# Patient Record
Sex: Female | Born: 1935 | Race: White | Hispanic: No | Smoking: Never smoker
Health system: Southern US, Community
[De-identification: ages and names within clinical notes are randomized; demographics above are authoritative.]

## PROBLEM LIST (undated history)

## (undated) DIAGNOSIS — I252 Old myocardial infarction: Secondary | ICD-10-CM

## (undated) DIAGNOSIS — D649 Anemia, unspecified: Secondary | ICD-10-CM

## (undated) DIAGNOSIS — Z8719 Personal history of other diseases of the digestive system: Secondary | ICD-10-CM

## (undated) DIAGNOSIS — E079 Disorder of thyroid, unspecified: Secondary | ICD-10-CM

## (undated) DIAGNOSIS — I639 Cerebral infarction, unspecified: Secondary | ICD-10-CM

## (undated) DIAGNOSIS — J449 Chronic obstructive pulmonary disease, unspecified: Secondary | ICD-10-CM

## (undated) DIAGNOSIS — D481 Neoplasm of uncertain behavior of connective and other soft tissue: Secondary | ICD-10-CM

## (undated) DIAGNOSIS — D4819 Other specified neoplasm of uncertain behavior of connective and other soft tissue: Secondary | ICD-10-CM

## (undated) DIAGNOSIS — E785 Hyperlipidemia, unspecified: Secondary | ICD-10-CM

## (undated) DIAGNOSIS — F419 Anxiety disorder, unspecified: Secondary | ICD-10-CM

## (undated) DIAGNOSIS — J9610 Chronic respiratory failure, unspecified whether with hypoxia or hypercapnia: Secondary | ICD-10-CM

## (undated) DIAGNOSIS — I10 Essential (primary) hypertension: Secondary | ICD-10-CM

## (undated) HISTORY — DX: Chronic respiratory failure, unspecified whether with hypoxia or hypercapnia: J96.10

## (undated) HISTORY — DX: Chronic obstructive pulmonary disease, unspecified: J44.9

## (undated) HISTORY — DX: Other specified neoplasm of uncertain behavior of connective and other soft tissue: D48.19

## (undated) HISTORY — PX: APPENDECTOMY: SHX54

## (undated) HISTORY — DX: Disorder of thyroid, unspecified: E07.9

## (undated) HISTORY — DX: Essential (primary) hypertension: I10

## (undated) HISTORY — PX: CORONARY ARTERY BYPASS GRAFT: SHX141

## (undated) HISTORY — PX: HERNIA REPAIR: SHX51

## (undated) HISTORY — PX: INSERT / REPLACE / REMOVE PACEMAKER: SUR710

## (undated) HISTORY — DX: Old myocardial infarction: I25.2

## (undated) HISTORY — PX: AORTIC VALVE REPAIR: SHX6306

## (undated) HISTORY — DX: Cerebral infarction, unspecified: I63.9

## (undated) HISTORY — DX: Personal history of other diseases of the digestive system: Z87.19

## (undated) HISTORY — DX: Anemia, unspecified: D64.9

## (undated) HISTORY — DX: Neoplasm of uncertain behavior of connective and other soft tissue: D48.1

## (undated) HISTORY — PX: ABDOMINAL HYSTERECTOMY: SHX81

## (undated) HISTORY — DX: Anxiety disorder, unspecified: F41.9

## (undated) HISTORY — DX: Hyperlipidemia, unspecified: E78.5

---

## 2008-03-01 ENCOUNTER — Ambulatory Visit: Payer: Self-pay | Admitting: Thoracic Surgery (Cardiothoracic Vascular Surgery)

## 2014-01-26 HISTORY — PX: ESOPHAGOGASTRODUODENOSCOPY: SHX1529

## 2014-03-09 DIAGNOSIS — I4891 Unspecified atrial fibrillation: Secondary | ICD-10-CM

## 2014-03-09 DIAGNOSIS — I639 Cerebral infarction, unspecified: Secondary | ICD-10-CM

## 2014-03-09 DIAGNOSIS — I6529 Occlusion and stenosis of unspecified carotid artery: Secondary | ICD-10-CM

## 2014-03-09 HISTORY — DX: Occlusion and stenosis of unspecified carotid artery: I65.29

## 2014-03-09 HISTORY — DX: Unspecified atrial fibrillation: I48.91

## 2014-03-09 HISTORY — DX: Cerebral infarction, unspecified: I63.9

## 2014-11-01 DIAGNOSIS — Z950 Presence of cardiac pacemaker: Secondary | ICD-10-CM | POA: Diagnosis not present

## 2014-11-01 DIAGNOSIS — I2510 Atherosclerotic heart disease of native coronary artery without angina pectoris: Secondary | ICD-10-CM | POA: Diagnosis not present

## 2014-11-01 DIAGNOSIS — I4891 Unspecified atrial fibrillation: Secondary | ICD-10-CM | POA: Diagnosis not present

## 2014-11-08 DIAGNOSIS — E785 Hyperlipidemia, unspecified: Secondary | ICD-10-CM | POA: Diagnosis not present

## 2014-11-08 DIAGNOSIS — G250 Essential tremor: Secondary | ICD-10-CM | POA: Diagnosis not present

## 2014-11-08 DIAGNOSIS — J449 Chronic obstructive pulmonary disease, unspecified: Secondary | ICD-10-CM | POA: Diagnosis not present

## 2014-11-08 DIAGNOSIS — Z681 Body mass index (BMI) 19 or less, adult: Secondary | ICD-10-CM | POA: Diagnosis not present

## 2014-11-08 DIAGNOSIS — G2581 Restless legs syndrome: Secondary | ICD-10-CM | POA: Diagnosis not present

## 2014-11-08 DIAGNOSIS — Z79899 Other long term (current) drug therapy: Secondary | ICD-10-CM | POA: Diagnosis not present

## 2014-11-08 DIAGNOSIS — E559 Vitamin D deficiency, unspecified: Secondary | ICD-10-CM | POA: Diagnosis not present

## 2014-11-08 DIAGNOSIS — M545 Low back pain: Secondary | ICD-10-CM | POA: Diagnosis not present

## 2014-11-14 DIAGNOSIS — I499 Cardiac arrhythmia, unspecified: Secondary | ICD-10-CM | POA: Diagnosis not present

## 2014-11-15 DIAGNOSIS — L578 Other skin changes due to chronic exposure to nonionizing radiation: Secondary | ICD-10-CM | POA: Diagnosis not present

## 2014-11-15 DIAGNOSIS — L821 Other seborrheic keratosis: Secondary | ICD-10-CM | POA: Diagnosis not present

## 2014-11-15 DIAGNOSIS — L820 Inflamed seborrheic keratosis: Secondary | ICD-10-CM | POA: Diagnosis not present

## 2015-01-10 DIAGNOSIS — M19011 Primary osteoarthritis, right shoulder: Secondary | ICD-10-CM | POA: Diagnosis not present

## 2015-01-10 DIAGNOSIS — Z8781 Personal history of (healed) traumatic fracture: Secondary | ICD-10-CM | POA: Diagnosis not present

## 2015-01-10 DIAGNOSIS — G2581 Restless legs syndrome: Secondary | ICD-10-CM | POA: Diagnosis not present

## 2015-01-10 DIAGNOSIS — M25512 Pain in left shoulder: Secondary | ICD-10-CM | POA: Diagnosis not present

## 2015-01-10 DIAGNOSIS — M545 Low back pain: Secondary | ICD-10-CM | POA: Diagnosis not present

## 2015-01-10 DIAGNOSIS — M129 Arthropathy, unspecified: Secondary | ICD-10-CM | POA: Diagnosis not present

## 2015-01-10 DIAGNOSIS — Z681 Body mass index (BMI) 19 or less, adult: Secondary | ICD-10-CM | POA: Diagnosis not present

## 2015-01-24 DIAGNOSIS — J449 Chronic obstructive pulmonary disease, unspecified: Secondary | ICD-10-CM | POA: Diagnosis not present

## 2015-01-24 DIAGNOSIS — Z13820 Encounter for screening for osteoporosis: Secondary | ICD-10-CM | POA: Diagnosis not present

## 2015-01-24 DIAGNOSIS — Z79899 Other long term (current) drug therapy: Secondary | ICD-10-CM | POA: Diagnosis not present

## 2015-02-08 DIAGNOSIS — R05 Cough: Secondary | ICD-10-CM | POA: Diagnosis not present

## 2015-02-08 DIAGNOSIS — Z6821 Body mass index (BMI) 21.0-21.9, adult: Secondary | ICD-10-CM | POA: Diagnosis not present

## 2015-02-08 DIAGNOSIS — J069 Acute upper respiratory infection, unspecified: Secondary | ICD-10-CM | POA: Diagnosis not present

## 2015-02-28 DIAGNOSIS — I6522 Occlusion and stenosis of left carotid artery: Secondary | ICD-10-CM | POA: Diagnosis not present

## 2015-03-01 DIAGNOSIS — I6522 Occlusion and stenosis of left carotid artery: Secondary | ICD-10-CM | POA: Diagnosis not present

## 2015-03-01 DIAGNOSIS — I63319 Cerebral infarction due to thrombosis of unspecified middle cerebral artery: Secondary | ICD-10-CM | POA: Diagnosis not present

## 2015-03-01 DIAGNOSIS — G250 Essential tremor: Secondary | ICD-10-CM | POA: Insufficient documentation

## 2015-03-01 DIAGNOSIS — I4891 Unspecified atrial fibrillation: Secondary | ICD-10-CM | POA: Diagnosis not present

## 2015-03-01 HISTORY — DX: Essential tremor: G25.0

## 2015-03-13 DIAGNOSIS — Z1212 Encounter for screening for malignant neoplasm of rectum: Secondary | ICD-10-CM | POA: Diagnosis not present

## 2015-03-13 DIAGNOSIS — Z1231 Encounter for screening mammogram for malignant neoplasm of breast: Secondary | ICD-10-CM | POA: Diagnosis not present

## 2015-03-13 DIAGNOSIS — F39 Unspecified mood [affective] disorder: Secondary | ICD-10-CM | POA: Diagnosis not present

## 2015-03-13 DIAGNOSIS — M545 Low back pain: Secondary | ICD-10-CM | POA: Diagnosis not present

## 2015-03-13 DIAGNOSIS — E785 Hyperlipidemia, unspecified: Secondary | ICD-10-CM | POA: Diagnosis not present

## 2015-03-13 DIAGNOSIS — E559 Vitamin D deficiency, unspecified: Secondary | ICD-10-CM | POA: Diagnosis not present

## 2015-03-13 DIAGNOSIS — I10 Essential (primary) hypertension: Secondary | ICD-10-CM | POA: Diagnosis not present

## 2015-03-13 DIAGNOSIS — Z79899 Other long term (current) drug therapy: Secondary | ICD-10-CM | POA: Diagnosis not present

## 2015-03-13 DIAGNOSIS — Z0000 Encounter for general adult medical examination without abnormal findings: Secondary | ICD-10-CM | POA: Diagnosis not present

## 2015-04-02 DIAGNOSIS — R300 Dysuria: Secondary | ICD-10-CM | POA: Diagnosis not present

## 2015-04-02 DIAGNOSIS — Z6822 Body mass index (BMI) 22.0-22.9, adult: Secondary | ICD-10-CM | POA: Diagnosis not present

## 2015-04-02 DIAGNOSIS — N390 Urinary tract infection, site not specified: Secondary | ICD-10-CM | POA: Diagnosis not present

## 2015-04-11 DIAGNOSIS — Z1231 Encounter for screening mammogram for malignant neoplasm of breast: Secondary | ICD-10-CM | POA: Diagnosis not present

## 2015-04-23 DIAGNOSIS — Z950 Presence of cardiac pacemaker: Secondary | ICD-10-CM

## 2015-04-23 DIAGNOSIS — I6522 Occlusion and stenosis of left carotid artery: Secondary | ICD-10-CM | POA: Diagnosis not present

## 2015-04-23 DIAGNOSIS — I10 Essential (primary) hypertension: Secondary | ICD-10-CM | POA: Insufficient documentation

## 2015-04-23 DIAGNOSIS — Z9889 Other specified postprocedural states: Secondary | ICD-10-CM

## 2015-04-23 DIAGNOSIS — I4891 Unspecified atrial fibrillation: Secondary | ICD-10-CM | POA: Diagnosis not present

## 2015-04-23 HISTORY — DX: Other specified postprocedural states: Z98.890

## 2015-04-23 HISTORY — DX: Presence of cardiac pacemaker: Z95.0

## 2015-04-23 HISTORY — DX: Essential (primary) hypertension: I10

## 2015-04-26 DIAGNOSIS — J449 Chronic obstructive pulmonary disease, unspecified: Secondary | ICD-10-CM | POA: Diagnosis not present

## 2015-04-26 DIAGNOSIS — R0602 Shortness of breath: Secondary | ICD-10-CM | POA: Diagnosis not present

## 2015-05-10 DIAGNOSIS — N3000 Acute cystitis without hematuria: Secondary | ICD-10-CM | POA: Diagnosis not present

## 2015-05-10 DIAGNOSIS — N3001 Acute cystitis with hematuria: Secondary | ICD-10-CM | POA: Diagnosis not present

## 2015-05-14 DIAGNOSIS — G2581 Restless legs syndrome: Secondary | ICD-10-CM | POA: Diagnosis not present

## 2015-05-14 DIAGNOSIS — R350 Frequency of micturition: Secondary | ICD-10-CM | POA: Diagnosis not present

## 2015-05-14 DIAGNOSIS — Z681 Body mass index (BMI) 19 or less, adult: Secondary | ICD-10-CM | POA: Diagnosis not present

## 2015-05-14 DIAGNOSIS — M545 Low back pain: Secondary | ICD-10-CM | POA: Diagnosis not present

## 2015-05-14 DIAGNOSIS — I10 Essential (primary) hypertension: Secondary | ICD-10-CM | POA: Diagnosis not present

## 2015-05-15 DIAGNOSIS — Z45018 Encounter for adjustment and management of other part of cardiac pacemaker: Secondary | ICD-10-CM | POA: Diagnosis not present

## 2015-05-15 DIAGNOSIS — I498 Other specified cardiac arrhythmias: Secondary | ICD-10-CM | POA: Diagnosis not present

## 2015-07-16 DIAGNOSIS — Z139 Encounter for screening, unspecified: Secondary | ICD-10-CM | POA: Diagnosis not present

## 2015-07-16 DIAGNOSIS — M129 Arthropathy, unspecified: Secondary | ICD-10-CM | POA: Diagnosis not present

## 2015-07-16 DIAGNOSIS — G2581 Restless legs syndrome: Secondary | ICD-10-CM | POA: Diagnosis not present

## 2015-07-16 DIAGNOSIS — M545 Low back pain: Secondary | ICD-10-CM | POA: Diagnosis not present

## 2015-07-16 DIAGNOSIS — Z1389 Encounter for screening for other disorder: Secondary | ICD-10-CM | POA: Diagnosis not present

## 2015-07-16 DIAGNOSIS — Z6822 Body mass index (BMI) 22.0-22.9, adult: Secondary | ICD-10-CM | POA: Diagnosis not present

## 2015-07-16 DIAGNOSIS — Z9181 History of falling: Secondary | ICD-10-CM | POA: Diagnosis not present

## 2015-08-02 DIAGNOSIS — I10 Essential (primary) hypertension: Secondary | ICD-10-CM | POA: Diagnosis not present

## 2015-08-02 DIAGNOSIS — Z23 Encounter for immunization: Secondary | ICD-10-CM | POA: Diagnosis not present

## 2015-08-02 DIAGNOSIS — J449 Chronic obstructive pulmonary disease, unspecified: Secondary | ICD-10-CM | POA: Diagnosis not present

## 2015-08-02 DIAGNOSIS — R0602 Shortness of breath: Secondary | ICD-10-CM | POA: Diagnosis not present

## 2015-08-02 DIAGNOSIS — J300 Vasomotor rhinitis: Secondary | ICD-10-CM | POA: Diagnosis not present

## 2015-09-04 DIAGNOSIS — Z950 Presence of cardiac pacemaker: Secondary | ICD-10-CM | POA: Diagnosis not present

## 2015-09-04 DIAGNOSIS — Z9282 Status post administration of tPA (rtPA) in a different facility within the last 24 hours prior to admission to current facility: Secondary | ICD-10-CM | POA: Diagnosis not present

## 2015-09-04 DIAGNOSIS — I361 Nonrheumatic tricuspid (valve) insufficiency: Secondary | ICD-10-CM | POA: Diagnosis not present

## 2015-09-04 DIAGNOSIS — G8194 Hemiplegia, unspecified affecting left nondominant side: Secondary | ICD-10-CM | POA: Diagnosis not present

## 2015-09-04 DIAGNOSIS — R079 Chest pain, unspecified: Secondary | ICD-10-CM | POA: Diagnosis not present

## 2015-09-04 DIAGNOSIS — G250 Essential tremor: Secondary | ICD-10-CM | POA: Diagnosis not present

## 2015-09-04 DIAGNOSIS — I10 Essential (primary) hypertension: Secondary | ICD-10-CM | POA: Diagnosis not present

## 2015-09-04 DIAGNOSIS — I639 Cerebral infarction, unspecified: Secondary | ICD-10-CM | POA: Diagnosis not present

## 2015-09-04 DIAGNOSIS — R29810 Facial weakness: Secondary | ICD-10-CM | POA: Diagnosis not present

## 2015-09-04 DIAGNOSIS — I63411 Cerebral infarction due to embolism of right middle cerebral artery: Secondary | ICD-10-CM | POA: Insufficient documentation

## 2015-09-04 DIAGNOSIS — G459 Transient cerebral ischemic attack, unspecified: Secondary | ICD-10-CM | POA: Diagnosis not present

## 2015-09-04 DIAGNOSIS — M6281 Muscle weakness (generalized): Secondary | ICD-10-CM | POA: Diagnosis not present

## 2015-09-04 DIAGNOSIS — K922 Gastrointestinal hemorrhage, unspecified: Secondary | ICD-10-CM | POA: Diagnosis not present

## 2015-09-04 DIAGNOSIS — E785 Hyperlipidemia, unspecified: Secondary | ICD-10-CM | POA: Diagnosis not present

## 2015-09-04 DIAGNOSIS — M25552 Pain in left hip: Secondary | ICD-10-CM | POA: Diagnosis not present

## 2015-09-04 DIAGNOSIS — M25551 Pain in right hip: Secondary | ICD-10-CM | POA: Diagnosis not present

## 2015-09-04 DIAGNOSIS — I4891 Unspecified atrial fibrillation: Secondary | ICD-10-CM | POA: Diagnosis not present

## 2015-09-04 DIAGNOSIS — I071 Rheumatic tricuspid insufficiency: Secondary | ICD-10-CM | POA: Diagnosis not present

## 2015-09-04 DIAGNOSIS — R4781 Slurred speech: Secondary | ICD-10-CM | POA: Diagnosis not present

## 2015-09-04 DIAGNOSIS — Z23 Encounter for immunization: Secondary | ICD-10-CM | POA: Diagnosis not present

## 2015-09-04 DIAGNOSIS — I6789 Other cerebrovascular disease: Secondary | ICD-10-CM | POA: Diagnosis not present

## 2015-09-04 DIAGNOSIS — I480 Paroxysmal atrial fibrillation: Secondary | ICD-10-CM | POA: Diagnosis not present

## 2015-09-04 DIAGNOSIS — G510 Bell's palsy: Secondary | ICD-10-CM | POA: Diagnosis not present

## 2015-09-04 DIAGNOSIS — S7002XA Contusion of left hip, initial encounter: Secondary | ICD-10-CM | POA: Diagnosis not present

## 2015-09-04 DIAGNOSIS — I482 Chronic atrial fibrillation: Secondary | ICD-10-CM | POA: Diagnosis not present

## 2015-09-04 HISTORY — DX: Cerebral infarction due to embolism of right middle cerebral artery: I63.411

## 2015-09-05 DIAGNOSIS — E785 Hyperlipidemia, unspecified: Secondary | ICD-10-CM | POA: Insufficient documentation

## 2015-09-07 DIAGNOSIS — Z23 Encounter for immunization: Secondary | ICD-10-CM | POA: Diagnosis not present

## 2015-09-07 DIAGNOSIS — I63411 Cerebral infarction due to embolism of right middle cerebral artery: Secondary | ICD-10-CM | POA: Diagnosis not present

## 2015-09-07 DIAGNOSIS — G510 Bell's palsy: Secondary | ICD-10-CM | POA: Diagnosis not present

## 2015-09-07 DIAGNOSIS — Z950 Presence of cardiac pacemaker: Secondary | ICD-10-CM | POA: Diagnosis not present

## 2015-09-07 DIAGNOSIS — G8194 Hemiplegia, unspecified affecting left nondominant side: Secondary | ICD-10-CM | POA: Diagnosis not present

## 2015-09-07 DIAGNOSIS — E785 Hyperlipidemia, unspecified: Secondary | ICD-10-CM | POA: Diagnosis not present

## 2015-09-07 DIAGNOSIS — I071 Rheumatic tricuspid insufficiency: Secondary | ICD-10-CM | POA: Diagnosis not present

## 2015-09-07 DIAGNOSIS — I10 Essential (primary) hypertension: Secondary | ICD-10-CM | POA: Diagnosis not present

## 2015-09-07 DIAGNOSIS — I480 Paroxysmal atrial fibrillation: Secondary | ICD-10-CM | POA: Diagnosis not present

## 2015-09-13 DIAGNOSIS — Z79899 Other long term (current) drug therapy: Secondary | ICD-10-CM | POA: Diagnosis not present

## 2015-09-13 DIAGNOSIS — M545 Low back pain: Secondary | ICD-10-CM | POA: Diagnosis not present

## 2015-09-13 DIAGNOSIS — E782 Mixed hyperlipidemia: Secondary | ICD-10-CM | POA: Diagnosis not present

## 2015-09-13 DIAGNOSIS — I4891 Unspecified atrial fibrillation: Secondary | ICD-10-CM | POA: Diagnosis not present

## 2015-09-13 DIAGNOSIS — G2581 Restless legs syndrome: Secondary | ICD-10-CM | POA: Diagnosis not present

## 2015-09-13 DIAGNOSIS — I714 Abdominal aortic aneurysm, without rupture: Secondary | ICD-10-CM | POA: Diagnosis not present

## 2015-09-13 DIAGNOSIS — J449 Chronic obstructive pulmonary disease, unspecified: Secondary | ICD-10-CM | POA: Diagnosis not present

## 2015-09-13 DIAGNOSIS — E039 Hypothyroidism, unspecified: Secondary | ICD-10-CM | POA: Diagnosis not present

## 2015-09-13 DIAGNOSIS — K219 Gastro-esophageal reflux disease without esophagitis: Secondary | ICD-10-CM | POA: Diagnosis not present

## 2015-09-25 DIAGNOSIS — I63411 Cerebral infarction due to embolism of right middle cerebral artery: Secondary | ICD-10-CM | POA: Diagnosis not present

## 2015-09-25 DIAGNOSIS — I63319 Cerebral infarction due to thrombosis of unspecified middle cerebral artery: Secondary | ICD-10-CM | POA: Diagnosis not present

## 2015-09-25 DIAGNOSIS — I480 Paroxysmal atrial fibrillation: Secondary | ICD-10-CM | POA: Diagnosis not present

## 2015-09-25 DIAGNOSIS — Z950 Presence of cardiac pacemaker: Secondary | ICD-10-CM | POA: Diagnosis not present

## 2015-09-25 DIAGNOSIS — E782 Mixed hyperlipidemia: Secondary | ICD-10-CM | POA: Diagnosis not present

## 2015-09-25 DIAGNOSIS — I6529 Occlusion and stenosis of unspecified carotid artery: Secondary | ICD-10-CM | POA: Diagnosis not present

## 2015-09-25 DIAGNOSIS — I10 Essential (primary) hypertension: Secondary | ICD-10-CM | POA: Diagnosis not present

## 2015-10-04 DIAGNOSIS — J449 Chronic obstructive pulmonary disease, unspecified: Secondary | ICD-10-CM | POA: Diagnosis not present

## 2015-10-08 DIAGNOSIS — I6522 Occlusion and stenosis of left carotid artery: Secondary | ICD-10-CM | POA: Diagnosis not present

## 2015-10-08 DIAGNOSIS — I4891 Unspecified atrial fibrillation: Secondary | ICD-10-CM | POA: Diagnosis not present

## 2015-10-08 DIAGNOSIS — I63319 Cerebral infarction due to thrombosis of unspecified middle cerebral artery: Secondary | ICD-10-CM | POA: Diagnosis not present

## 2015-10-08 DIAGNOSIS — G250 Essential tremor: Secondary | ICD-10-CM | POA: Diagnosis not present

## 2015-10-09 DIAGNOSIS — I498 Other specified cardiac arrhythmias: Secondary | ICD-10-CM | POA: Diagnosis not present

## 2015-10-09 DIAGNOSIS — Z45018 Encounter for adjustment and management of other part of cardiac pacemaker: Secondary | ICD-10-CM | POA: Diagnosis not present

## 2015-10-24 DIAGNOSIS — E782 Mixed hyperlipidemia: Secondary | ICD-10-CM | POA: Diagnosis not present

## 2015-10-24 DIAGNOSIS — I63411 Cerebral infarction due to embolism of right middle cerebral artery: Secondary | ICD-10-CM | POA: Diagnosis not present

## 2015-10-24 DIAGNOSIS — I480 Paroxysmal atrial fibrillation: Secondary | ICD-10-CM | POA: Diagnosis not present

## 2015-10-27 DIAGNOSIS — I4891 Unspecified atrial fibrillation: Secondary | ICD-10-CM | POA: Diagnosis not present

## 2015-10-27 DIAGNOSIS — R200 Anesthesia of skin: Secondary | ICD-10-CM | POA: Diagnosis not present

## 2015-10-27 DIAGNOSIS — I6789 Other cerebrovascular disease: Secondary | ICD-10-CM | POA: Diagnosis not present

## 2015-10-27 DIAGNOSIS — I10 Essential (primary) hypertension: Secondary | ICD-10-CM | POA: Diagnosis not present

## 2015-10-27 DIAGNOSIS — Z950 Presence of cardiac pacemaker: Secondary | ICD-10-CM | POA: Diagnosis not present

## 2015-10-27 DIAGNOSIS — Z8673 Personal history of transient ischemic attack (TIA), and cerebral infarction without residual deficits: Secondary | ICD-10-CM | POA: Diagnosis not present

## 2015-10-31 DIAGNOSIS — I10 Essential (primary) hypertension: Secondary | ICD-10-CM | POA: Diagnosis not present

## 2015-10-31 DIAGNOSIS — Z9181 History of falling: Secondary | ICD-10-CM | POA: Diagnosis not present

## 2015-10-31 DIAGNOSIS — I4891 Unspecified atrial fibrillation: Secondary | ICD-10-CM | POA: Diagnosis not present

## 2015-10-31 DIAGNOSIS — I679 Cerebrovascular disease, unspecified: Secondary | ICD-10-CM | POA: Diagnosis not present

## 2015-10-31 DIAGNOSIS — I63319 Cerebral infarction due to thrombosis of unspecified middle cerebral artery: Secondary | ICD-10-CM | POA: Diagnosis not present

## 2015-10-31 DIAGNOSIS — I6522 Occlusion and stenosis of left carotid artery: Secondary | ICD-10-CM | POA: Diagnosis not present

## 2015-11-13 DIAGNOSIS — I679 Cerebrovascular disease, unspecified: Secondary | ICD-10-CM | POA: Diagnosis not present

## 2015-11-13 DIAGNOSIS — I6523 Occlusion and stenosis of bilateral carotid arteries: Secondary | ICD-10-CM | POA: Diagnosis not present

## 2015-11-13 DIAGNOSIS — I6522 Occlusion and stenosis of left carotid artery: Secondary | ICD-10-CM | POA: Diagnosis not present

## 2015-11-14 DIAGNOSIS — M129 Arthropathy, unspecified: Secondary | ICD-10-CM | POA: Diagnosis not present

## 2015-11-14 DIAGNOSIS — I10 Essential (primary) hypertension: Secondary | ICD-10-CM | POA: Diagnosis not present

## 2015-11-14 DIAGNOSIS — E039 Hypothyroidism, unspecified: Secondary | ICD-10-CM | POA: Diagnosis not present

## 2015-11-14 DIAGNOSIS — Z79899 Other long term (current) drug therapy: Secondary | ICD-10-CM | POA: Diagnosis not present

## 2015-11-14 DIAGNOSIS — I4891 Unspecified atrial fibrillation: Secondary | ICD-10-CM | POA: Diagnosis not present

## 2015-11-14 DIAGNOSIS — I639 Cerebral infarction, unspecified: Secondary | ICD-10-CM | POA: Diagnosis not present

## 2015-11-14 DIAGNOSIS — I779 Disorder of arteries and arterioles, unspecified: Secondary | ICD-10-CM | POA: Diagnosis not present

## 2015-11-14 DIAGNOSIS — Z8673 Personal history of transient ischemic attack (TIA), and cerebral infarction without residual deficits: Secondary | ICD-10-CM | POA: Diagnosis not present

## 2015-11-14 DIAGNOSIS — E782 Mixed hyperlipidemia: Secondary | ICD-10-CM | POA: Diagnosis not present

## 2015-11-14 DIAGNOSIS — G2581 Restless legs syndrome: Secondary | ICD-10-CM | POA: Diagnosis not present

## 2015-11-16 DIAGNOSIS — Z950 Presence of cardiac pacemaker: Secondary | ICD-10-CM | POA: Diagnosis not present

## 2015-11-16 DIAGNOSIS — I4891 Unspecified atrial fibrillation: Secondary | ICD-10-CM | POA: Diagnosis not present

## 2015-11-16 DIAGNOSIS — I10 Essential (primary) hypertension: Secondary | ICD-10-CM | POA: Diagnosis not present

## 2015-11-16 DIAGNOSIS — I6523 Occlusion and stenosis of bilateral carotid arteries: Secondary | ICD-10-CM | POA: Diagnosis not present

## 2015-11-19 DIAGNOSIS — N3001 Acute cystitis with hematuria: Secondary | ICD-10-CM | POA: Diagnosis not present

## 2015-11-19 DIAGNOSIS — N3090 Cystitis, unspecified without hematuria: Secondary | ICD-10-CM | POA: Diagnosis not present

## 2015-11-23 DIAGNOSIS — I2510 Atherosclerotic heart disease of native coronary artery without angina pectoris: Secondary | ICD-10-CM | POA: Diagnosis not present

## 2015-12-06 DIAGNOSIS — Z9181 History of falling: Secondary | ICD-10-CM | POA: Diagnosis not present

## 2015-12-06 DIAGNOSIS — Z6821 Body mass index (BMI) 21.0-21.9, adult: Secondary | ICD-10-CM | POA: Diagnosis not present

## 2015-12-06 DIAGNOSIS — I63319 Cerebral infarction due to thrombosis of unspecified middle cerebral artery: Secondary | ICD-10-CM | POA: Diagnosis not present

## 2015-12-06 DIAGNOSIS — I679 Cerebrovascular disease, unspecified: Secondary | ICD-10-CM | POA: Diagnosis not present

## 2015-12-06 DIAGNOSIS — G250 Essential tremor: Secondary | ICD-10-CM | POA: Diagnosis not present

## 2015-12-06 DIAGNOSIS — I6522 Occlusion and stenosis of left carotid artery: Secondary | ICD-10-CM | POA: Diagnosis not present

## 2015-12-06 DIAGNOSIS — I4891 Unspecified atrial fibrillation: Secondary | ICD-10-CM | POA: Diagnosis not present

## 2015-12-06 DIAGNOSIS — R350 Frequency of micturition: Secondary | ICD-10-CM | POA: Diagnosis not present

## 2015-12-06 DIAGNOSIS — I10 Essential (primary) hypertension: Secondary | ICD-10-CM | POA: Diagnosis not present

## 2015-12-12 DIAGNOSIS — I10 Essential (primary) hypertension: Secondary | ICD-10-CM | POA: Diagnosis not present

## 2015-12-20 DIAGNOSIS — I480 Paroxysmal atrial fibrillation: Secondary | ICD-10-CM | POA: Diagnosis not present

## 2015-12-20 DIAGNOSIS — Z9889 Other specified postprocedural states: Secondary | ICD-10-CM | POA: Diagnosis not present

## 2015-12-20 DIAGNOSIS — I6523 Occlusion and stenosis of bilateral carotid arteries: Secondary | ICD-10-CM | POA: Diagnosis not present

## 2015-12-20 DIAGNOSIS — Z950 Presence of cardiac pacemaker: Secondary | ICD-10-CM | POA: Diagnosis not present

## 2015-12-20 DIAGNOSIS — I10 Essential (primary) hypertension: Secondary | ICD-10-CM | POA: Diagnosis not present

## 2016-01-03 DIAGNOSIS — J449 Chronic obstructive pulmonary disease, unspecified: Secondary | ICD-10-CM | POA: Diagnosis not present

## 2016-01-03 DIAGNOSIS — R05 Cough: Secondary | ICD-10-CM | POA: Diagnosis not present

## 2016-01-24 DIAGNOSIS — J449 Chronic obstructive pulmonary disease, unspecified: Secondary | ICD-10-CM | POA: Diagnosis not present

## 2016-02-07 DIAGNOSIS — Z79899 Other long term (current) drug therapy: Secondary | ICD-10-CM | POA: Diagnosis not present

## 2016-02-07 DIAGNOSIS — K219 Gastro-esophageal reflux disease without esophagitis: Secondary | ICD-10-CM | POA: Diagnosis not present

## 2016-02-07 DIAGNOSIS — G2581 Restless legs syndrome: Secondary | ICD-10-CM | POA: Diagnosis not present

## 2016-02-07 DIAGNOSIS — M545 Low back pain: Secondary | ICD-10-CM | POA: Diagnosis not present

## 2016-02-07 DIAGNOSIS — M129 Arthropathy, unspecified: Secondary | ICD-10-CM | POA: Diagnosis not present

## 2016-02-07 DIAGNOSIS — E785 Hyperlipidemia, unspecified: Secondary | ICD-10-CM | POA: Diagnosis not present

## 2016-02-07 DIAGNOSIS — R636 Underweight: Secondary | ICD-10-CM | POA: Diagnosis not present

## 2016-02-07 DIAGNOSIS — I10 Essential (primary) hypertension: Secondary | ICD-10-CM | POA: Diagnosis not present

## 2016-02-07 DIAGNOSIS — Z6822 Body mass index (BMI) 22.0-22.9, adult: Secondary | ICD-10-CM | POA: Diagnosis not present

## 2016-02-07 DIAGNOSIS — E039 Hypothyroidism, unspecified: Secondary | ICD-10-CM | POA: Diagnosis not present

## 2016-02-07 DIAGNOSIS — E559 Vitamin D deficiency, unspecified: Secondary | ICD-10-CM | POA: Diagnosis not present

## 2016-04-02 DIAGNOSIS — N3001 Acute cystitis with hematuria: Secondary | ICD-10-CM | POA: Diagnosis not present

## 2016-04-02 DIAGNOSIS — N3091 Cystitis, unspecified with hematuria: Secondary | ICD-10-CM | POA: Diagnosis not present

## 2016-04-11 DIAGNOSIS — Z950 Presence of cardiac pacemaker: Secondary | ICD-10-CM | POA: Diagnosis not present

## 2016-04-14 DIAGNOSIS — S0990XA Unspecified injury of head, initial encounter: Secondary | ICD-10-CM | POA: Diagnosis not present

## 2016-04-14 DIAGNOSIS — R259 Unspecified abnormal involuntary movements: Secondary | ICD-10-CM | POA: Diagnosis not present

## 2016-04-14 DIAGNOSIS — S0003XA Contusion of scalp, initial encounter: Secondary | ICD-10-CM | POA: Diagnosis not present

## 2016-04-22 DIAGNOSIS — I10 Essential (primary) hypertension: Secondary | ICD-10-CM | POA: Diagnosis not present

## 2016-04-22 DIAGNOSIS — Z6822 Body mass index (BMI) 22.0-22.9, adult: Secondary | ICD-10-CM | POA: Diagnosis not present

## 2016-04-22 DIAGNOSIS — Z09 Encounter for follow-up examination after completed treatment for conditions other than malignant neoplasm: Secondary | ICD-10-CM | POA: Diagnosis not present

## 2016-04-22 DIAGNOSIS — Z79899 Other long term (current) drug therapy: Secondary | ICD-10-CM | POA: Diagnosis not present

## 2016-04-22 DIAGNOSIS — W19XXXA Unspecified fall, initial encounter: Secondary | ICD-10-CM | POA: Diagnosis not present

## 2016-04-24 DIAGNOSIS — J449 Chronic obstructive pulmonary disease, unspecified: Secondary | ICD-10-CM | POA: Diagnosis not present

## 2016-04-24 DIAGNOSIS — R0602 Shortness of breath: Secondary | ICD-10-CM | POA: Diagnosis not present

## 2016-05-04 DIAGNOSIS — S40019A Contusion of unspecified shoulder, initial encounter: Secondary | ICD-10-CM | POA: Diagnosis not present

## 2016-05-04 DIAGNOSIS — M25512 Pain in left shoulder: Secondary | ICD-10-CM | POA: Diagnosis not present

## 2016-05-04 DIAGNOSIS — R55 Syncope and collapse: Secondary | ICD-10-CM | POA: Diagnosis not present

## 2016-05-04 DIAGNOSIS — S4992XA Unspecified injury of left shoulder and upper arm, initial encounter: Secondary | ICD-10-CM | POA: Diagnosis not present

## 2016-05-04 DIAGNOSIS — S40012A Contusion of left shoulder, initial encounter: Secondary | ICD-10-CM | POA: Diagnosis not present

## 2016-05-04 DIAGNOSIS — S0003XA Contusion of scalp, initial encounter: Secondary | ICD-10-CM | POA: Diagnosis not present

## 2016-05-04 DIAGNOSIS — S064X0A Epidural hemorrhage without loss of consciousness, initial encounter: Secondary | ICD-10-CM | POA: Diagnosis not present

## 2016-05-04 DIAGNOSIS — S0990XA Unspecified injury of head, initial encounter: Secondary | ICD-10-CM | POA: Diagnosis not present

## 2016-05-08 DIAGNOSIS — I10 Essential (primary) hypertension: Secondary | ICD-10-CM | POA: Diagnosis not present

## 2016-05-08 DIAGNOSIS — I4891 Unspecified atrial fibrillation: Secondary | ICD-10-CM | POA: Diagnosis not present

## 2016-05-08 DIAGNOSIS — Z6823 Body mass index (BMI) 23.0-23.9, adult: Secondary | ICD-10-CM | POA: Diagnosis not present

## 2016-05-08 DIAGNOSIS — M545 Low back pain: Secondary | ICD-10-CM | POA: Diagnosis not present

## 2016-05-08 DIAGNOSIS — J449 Chronic obstructive pulmonary disease, unspecified: Secondary | ICD-10-CM | POA: Diagnosis not present

## 2016-05-08 DIAGNOSIS — Z0000 Encounter for general adult medical examination without abnormal findings: Secondary | ICD-10-CM | POA: Diagnosis not present

## 2016-05-08 DIAGNOSIS — Z1231 Encounter for screening mammogram for malignant neoplasm of breast: Secondary | ICD-10-CM | POA: Diagnosis not present

## 2016-05-08 DIAGNOSIS — F418 Other specified anxiety disorders: Secondary | ICD-10-CM | POA: Diagnosis not present

## 2016-05-08 DIAGNOSIS — E782 Mixed hyperlipidemia: Secondary | ICD-10-CM | POA: Diagnosis not present

## 2016-05-20 DIAGNOSIS — Z1231 Encounter for screening mammogram for malignant neoplasm of breast: Secondary | ICD-10-CM | POA: Diagnosis not present

## 2016-05-22 DIAGNOSIS — I10 Essential (primary) hypertension: Secondary | ICD-10-CM | POA: Diagnosis not present

## 2016-05-22 DIAGNOSIS — I6522 Occlusion and stenosis of left carotid artery: Secondary | ICD-10-CM | POA: Diagnosis not present

## 2016-05-22 DIAGNOSIS — Z9889 Other specified postprocedural states: Secondary | ICD-10-CM | POA: Diagnosis not present

## 2016-05-22 DIAGNOSIS — I482 Chronic atrial fibrillation: Secondary | ICD-10-CM | POA: Diagnosis not present

## 2016-05-29 DIAGNOSIS — I679 Cerebrovascular disease, unspecified: Secondary | ICD-10-CM | POA: Diagnosis not present

## 2016-05-29 DIAGNOSIS — Z9181 History of falling: Secondary | ICD-10-CM | POA: Diagnosis not present

## 2016-05-29 DIAGNOSIS — G609 Hereditary and idiopathic neuropathy, unspecified: Secondary | ICD-10-CM | POA: Diagnosis not present

## 2016-05-29 DIAGNOSIS — I63319 Cerebral infarction due to thrombosis of unspecified middle cerebral artery: Secondary | ICD-10-CM | POA: Diagnosis not present

## 2016-05-29 DIAGNOSIS — I4891 Unspecified atrial fibrillation: Secondary | ICD-10-CM | POA: Diagnosis not present

## 2016-05-29 DIAGNOSIS — G250 Essential tremor: Secondary | ICD-10-CM | POA: Diagnosis not present

## 2016-06-10 DIAGNOSIS — I6523 Occlusion and stenosis of bilateral carotid arteries: Secondary | ICD-10-CM | POA: Diagnosis not present

## 2016-06-10 DIAGNOSIS — Z8673 Personal history of transient ischemic attack (TIA), and cerebral infarction without residual deficits: Secondary | ICD-10-CM | POA: Diagnosis not present

## 2016-06-10 DIAGNOSIS — I639 Cerebral infarction, unspecified: Secondary | ICD-10-CM | POA: Diagnosis not present

## 2016-06-10 DIAGNOSIS — R2681 Unsteadiness on feet: Secondary | ICD-10-CM | POA: Diagnosis not present

## 2016-07-03 DIAGNOSIS — Z6823 Body mass index (BMI) 23.0-23.9, adult: Secondary | ICD-10-CM | POA: Diagnosis not present

## 2016-07-03 DIAGNOSIS — B370 Candidal stomatitis: Secondary | ICD-10-CM | POA: Diagnosis not present

## 2016-07-03 DIAGNOSIS — K529 Noninfective gastroenteritis and colitis, unspecified: Secondary | ICD-10-CM | POA: Diagnosis not present

## 2016-07-03 DIAGNOSIS — R110 Nausea: Secondary | ICD-10-CM | POA: Diagnosis not present

## 2016-07-09 DIAGNOSIS — Z950 Presence of cardiac pacemaker: Secondary | ICD-10-CM | POA: Diagnosis not present

## 2016-08-11 DIAGNOSIS — Z9181 History of falling: Secondary | ICD-10-CM | POA: Diagnosis not present

## 2016-08-11 DIAGNOSIS — Z23 Encounter for immunization: Secondary | ICD-10-CM | POA: Diagnosis not present

## 2016-08-11 DIAGNOSIS — E039 Hypothyroidism, unspecified: Secondary | ICD-10-CM | POA: Diagnosis not present

## 2016-08-11 DIAGNOSIS — Z79899 Other long term (current) drug therapy: Secondary | ICD-10-CM | POA: Diagnosis not present

## 2016-08-11 DIAGNOSIS — Z139 Encounter for screening, unspecified: Secondary | ICD-10-CM | POA: Diagnosis not present

## 2016-08-11 DIAGNOSIS — G2581 Restless legs syndrome: Secondary | ICD-10-CM | POA: Diagnosis not present

## 2016-08-11 DIAGNOSIS — M545 Low back pain: Secondary | ICD-10-CM | POA: Diagnosis not present

## 2016-08-11 DIAGNOSIS — Z1389 Encounter for screening for other disorder: Secondary | ICD-10-CM | POA: Diagnosis not present

## 2016-08-11 DIAGNOSIS — Z6823 Body mass index (BMI) 23.0-23.9, adult: Secondary | ICD-10-CM | POA: Diagnosis not present

## 2016-08-20 DIAGNOSIS — J449 Chronic obstructive pulmonary disease, unspecified: Secondary | ICD-10-CM | POA: Diagnosis not present

## 2016-08-20 DIAGNOSIS — R0602 Shortness of breath: Secondary | ICD-10-CM | POA: Diagnosis not present

## 2016-08-27 DIAGNOSIS — G609 Hereditary and idiopathic neuropathy, unspecified: Secondary | ICD-10-CM | POA: Diagnosis not present

## 2016-08-27 DIAGNOSIS — I63319 Cerebral infarction due to thrombosis of unspecified middle cerebral artery: Secondary | ICD-10-CM | POA: Diagnosis not present

## 2016-08-27 DIAGNOSIS — G250 Essential tremor: Secondary | ICD-10-CM | POA: Diagnosis not present

## 2016-08-27 DIAGNOSIS — Z9181 History of falling: Secondary | ICD-10-CM | POA: Diagnosis not present

## 2016-08-27 DIAGNOSIS — I6930 Unspecified sequelae of cerebral infarction: Secondary | ICD-10-CM | POA: Diagnosis not present

## 2016-08-27 DIAGNOSIS — I679 Cerebrovascular disease, unspecified: Secondary | ICD-10-CM | POA: Diagnosis not present

## 2016-10-09 DIAGNOSIS — Z950 Presence of cardiac pacemaker: Secondary | ICD-10-CM | POA: Diagnosis not present

## 2016-10-15 DIAGNOSIS — R0602 Shortness of breath: Secondary | ICD-10-CM | POA: Diagnosis not present

## 2016-10-22 DIAGNOSIS — R300 Dysuria: Secondary | ICD-10-CM | POA: Diagnosis not present

## 2016-11-17 DIAGNOSIS — E039 Hypothyroidism, unspecified: Secondary | ICD-10-CM | POA: Diagnosis not present

## 2016-11-17 DIAGNOSIS — Z6823 Body mass index (BMI) 23.0-23.9, adult: Secondary | ICD-10-CM | POA: Diagnosis not present

## 2016-11-17 DIAGNOSIS — M19019 Primary osteoarthritis, unspecified shoulder: Secondary | ICD-10-CM | POA: Diagnosis not present

## 2016-11-17 DIAGNOSIS — Z79899 Other long term (current) drug therapy: Secondary | ICD-10-CM | POA: Diagnosis not present

## 2016-11-17 DIAGNOSIS — Z1389 Encounter for screening for other disorder: Secondary | ICD-10-CM | POA: Diagnosis not present

## 2016-11-17 DIAGNOSIS — R140 Abdominal distension (gaseous): Secondary | ICD-10-CM | POA: Diagnosis not present

## 2016-11-17 DIAGNOSIS — M545 Low back pain: Secondary | ICD-10-CM | POA: Diagnosis not present

## 2016-11-17 DIAGNOSIS — G2581 Restless legs syndrome: Secondary | ICD-10-CM | POA: Diagnosis not present

## 2016-11-24 DIAGNOSIS — I63411 Cerebral infarction due to embolism of right middle cerebral artery: Secondary | ICD-10-CM | POA: Diagnosis not present

## 2016-11-24 DIAGNOSIS — I10 Essential (primary) hypertension: Secondary | ICD-10-CM | POA: Diagnosis not present

## 2016-11-24 DIAGNOSIS — Z9889 Other specified postprocedural states: Secondary | ICD-10-CM | POA: Diagnosis not present

## 2016-11-24 DIAGNOSIS — E782 Mixed hyperlipidemia: Secondary | ICD-10-CM | POA: Diagnosis not present

## 2016-11-24 DIAGNOSIS — I482 Chronic atrial fibrillation: Secondary | ICD-10-CM | POA: Diagnosis not present

## 2017-01-15 DIAGNOSIS — Z45018 Encounter for adjustment and management of other part of cardiac pacemaker: Secondary | ICD-10-CM | POA: Diagnosis not present

## 2017-01-15 DIAGNOSIS — I482 Chronic atrial fibrillation: Secondary | ICD-10-CM | POA: Diagnosis not present

## 2017-01-21 DIAGNOSIS — I10 Essential (primary) hypertension: Secondary | ICD-10-CM | POA: Diagnosis not present

## 2017-01-21 DIAGNOSIS — I63411 Cerebral infarction due to embolism of right middle cerebral artery: Secondary | ICD-10-CM | POA: Diagnosis not present

## 2017-01-21 DIAGNOSIS — I482 Chronic atrial fibrillation: Secondary | ICD-10-CM | POA: Diagnosis not present

## 2017-01-21 DIAGNOSIS — E782 Mixed hyperlipidemia: Secondary | ICD-10-CM | POA: Diagnosis not present

## 2017-01-21 DIAGNOSIS — Z9889 Other specified postprocedural states: Secondary | ICD-10-CM | POA: Diagnosis not present

## 2017-01-29 DIAGNOSIS — Z9889 Other specified postprocedural states: Secondary | ICD-10-CM | POA: Diagnosis not present

## 2017-01-29 DIAGNOSIS — I10 Essential (primary) hypertension: Secondary | ICD-10-CM | POA: Diagnosis not present

## 2017-01-29 DIAGNOSIS — E782 Mixed hyperlipidemia: Secondary | ICD-10-CM | POA: Diagnosis not present

## 2017-01-29 DIAGNOSIS — I63411 Cerebral infarction due to embolism of right middle cerebral artery: Secondary | ICD-10-CM | POA: Diagnosis not present

## 2017-01-29 DIAGNOSIS — I480 Paroxysmal atrial fibrillation: Secondary | ICD-10-CM | POA: Diagnosis not present

## 2017-02-11 DIAGNOSIS — R300 Dysuria: Secondary | ICD-10-CM | POA: Diagnosis not present

## 2017-02-11 DIAGNOSIS — E039 Hypothyroidism, unspecified: Secondary | ICD-10-CM | POA: Diagnosis not present

## 2017-02-11 DIAGNOSIS — E782 Mixed hyperlipidemia: Secondary | ICD-10-CM | POA: Diagnosis not present

## 2017-02-11 DIAGNOSIS — Z79899 Other long term (current) drug therapy: Secondary | ICD-10-CM | POA: Diagnosis not present

## 2017-02-11 DIAGNOSIS — E559 Vitamin D deficiency, unspecified: Secondary | ICD-10-CM | POA: Diagnosis not present

## 2017-02-11 DIAGNOSIS — I10 Essential (primary) hypertension: Secondary | ICD-10-CM | POA: Diagnosis not present

## 2017-02-11 DIAGNOSIS — M545 Low back pain: Secondary | ICD-10-CM | POA: Diagnosis not present

## 2017-02-11 DIAGNOSIS — G2581 Restless legs syndrome: Secondary | ICD-10-CM | POA: Diagnosis not present

## 2017-02-11 DIAGNOSIS — Z6824 Body mass index (BMI) 24.0-24.9, adult: Secondary | ICD-10-CM | POA: Diagnosis not present

## 2017-02-16 DIAGNOSIS — I350 Nonrheumatic aortic (valve) stenosis: Secondary | ICD-10-CM | POA: Diagnosis not present

## 2017-02-16 DIAGNOSIS — I340 Nonrheumatic mitral (valve) insufficiency: Secondary | ICD-10-CM | POA: Diagnosis not present

## 2017-03-10 DIAGNOSIS — R0602 Shortness of breath: Secondary | ICD-10-CM | POA: Diagnosis not present

## 2017-03-10 DIAGNOSIS — I10 Essential (primary) hypertension: Secondary | ICD-10-CM | POA: Diagnosis not present

## 2017-03-10 DIAGNOSIS — J449 Chronic obstructive pulmonary disease, unspecified: Secondary | ICD-10-CM | POA: Diagnosis not present

## 2017-03-10 DIAGNOSIS — I4891 Unspecified atrial fibrillation: Secondary | ICD-10-CM | POA: Diagnosis not present

## 2017-03-12 DIAGNOSIS — I10 Essential (primary) hypertension: Secondary | ICD-10-CM | POA: Diagnosis not present

## 2017-03-12 DIAGNOSIS — Z9889 Other specified postprocedural states: Secondary | ICD-10-CM | POA: Diagnosis not present

## 2017-03-12 DIAGNOSIS — Z950 Presence of cardiac pacemaker: Secondary | ICD-10-CM | POA: Diagnosis not present

## 2017-03-12 DIAGNOSIS — E782 Mixed hyperlipidemia: Secondary | ICD-10-CM | POA: Diagnosis not present

## 2017-03-12 DIAGNOSIS — I482 Chronic atrial fibrillation: Secondary | ICD-10-CM | POA: Diagnosis not present

## 2017-03-13 DIAGNOSIS — M810 Age-related osteoporosis without current pathological fracture: Secondary | ICD-10-CM | POA: Diagnosis not present

## 2017-04-01 DIAGNOSIS — N3001 Acute cystitis with hematuria: Secondary | ICD-10-CM | POA: Diagnosis not present

## 2017-04-01 DIAGNOSIS — N390 Urinary tract infection, site not specified: Secondary | ICD-10-CM | POA: Diagnosis not present

## 2017-04-09 DIAGNOSIS — Z8673 Personal history of transient ischemic attack (TIA), and cerebral infarction without residual deficits: Secondary | ICD-10-CM | POA: Diagnosis not present

## 2017-04-09 DIAGNOSIS — R791 Abnormal coagulation profile: Secondary | ICD-10-CM | POA: Diagnosis not present

## 2017-04-09 DIAGNOSIS — R2689 Other abnormalities of gait and mobility: Secondary | ICD-10-CM | POA: Diagnosis not present

## 2017-04-09 DIAGNOSIS — I10 Essential (primary) hypertension: Secondary | ICD-10-CM | POA: Diagnosis not present

## 2017-04-09 DIAGNOSIS — E039 Hypothyroidism, unspecified: Secondary | ICD-10-CM

## 2017-04-09 DIAGNOSIS — Z9181 History of falling: Secondary | ICD-10-CM | POA: Diagnosis not present

## 2017-04-09 DIAGNOSIS — Z8679 Personal history of other diseases of the circulatory system: Secondary | ICD-10-CM | POA: Diagnosis not present

## 2017-04-09 DIAGNOSIS — E871 Hypo-osmolality and hyponatremia: Secondary | ICD-10-CM | POA: Insufficient documentation

## 2017-04-09 DIAGNOSIS — N3090 Cystitis, unspecified without hematuria: Secondary | ICD-10-CM | POA: Diagnosis not present

## 2017-04-09 DIAGNOSIS — Z950 Presence of cardiac pacemaker: Secondary | ICD-10-CM | POA: Diagnosis not present

## 2017-04-09 DIAGNOSIS — R29898 Other symptoms and signs involving the musculoskeletal system: Secondary | ICD-10-CM | POA: Diagnosis not present

## 2017-04-09 DIAGNOSIS — R262 Difficulty in walking, not elsewhere classified: Secondary | ICD-10-CM | POA: Diagnosis not present

## 2017-04-09 DIAGNOSIS — E785 Hyperlipidemia, unspecified: Secondary | ICD-10-CM | POA: Diagnosis not present

## 2017-04-09 DIAGNOSIS — R531 Weakness: Secondary | ICD-10-CM | POA: Diagnosis not present

## 2017-04-09 DIAGNOSIS — I482 Chronic atrial fibrillation: Secondary | ICD-10-CM | POA: Diagnosis not present

## 2017-04-09 HISTORY — DX: Hypothyroidism, unspecified: E03.9

## 2017-04-09 HISTORY — DX: Hypo-osmolality and hyponatremia: E87.1

## 2017-04-13 DIAGNOSIS — I10 Essential (primary) hypertension: Secondary | ICD-10-CM | POA: Diagnosis not present

## 2017-04-13 DIAGNOSIS — K449 Diaphragmatic hernia without obstruction or gangrene: Secondary | ICD-10-CM | POA: Diagnosis not present

## 2017-04-13 DIAGNOSIS — I69344 Monoplegia of lower limb following cerebral infarction affecting left non-dominant side: Secondary | ICD-10-CM | POA: Diagnosis not present

## 2017-04-13 DIAGNOSIS — I480 Paroxysmal atrial fibrillation: Secondary | ICD-10-CM | POA: Diagnosis not present

## 2017-04-13 DIAGNOSIS — Z7901 Long term (current) use of anticoagulants: Secondary | ICD-10-CM | POA: Diagnosis not present

## 2017-04-13 DIAGNOSIS — Z950 Presence of cardiac pacemaker: Secondary | ICD-10-CM | POA: Diagnosis not present

## 2017-04-14 DIAGNOSIS — Z7901 Long term (current) use of anticoagulants: Secondary | ICD-10-CM | POA: Diagnosis not present

## 2017-04-14 DIAGNOSIS — Z950 Presence of cardiac pacemaker: Secondary | ICD-10-CM | POA: Diagnosis not present

## 2017-04-14 DIAGNOSIS — I69344 Monoplegia of lower limb following cerebral infarction affecting left non-dominant side: Secondary | ICD-10-CM | POA: Diagnosis not present

## 2017-04-14 DIAGNOSIS — I10 Essential (primary) hypertension: Secondary | ICD-10-CM | POA: Diagnosis not present

## 2017-04-14 DIAGNOSIS — K449 Diaphragmatic hernia without obstruction or gangrene: Secondary | ICD-10-CM | POA: Diagnosis not present

## 2017-04-14 DIAGNOSIS — I480 Paroxysmal atrial fibrillation: Secondary | ICD-10-CM | POA: Diagnosis not present

## 2017-04-15 DIAGNOSIS — Z950 Presence of cardiac pacemaker: Secondary | ICD-10-CM | POA: Diagnosis not present

## 2017-04-15 DIAGNOSIS — I480 Paroxysmal atrial fibrillation: Secondary | ICD-10-CM | POA: Diagnosis not present

## 2017-04-15 DIAGNOSIS — Z7901 Long term (current) use of anticoagulants: Secondary | ICD-10-CM | POA: Diagnosis not present

## 2017-04-15 DIAGNOSIS — I69344 Monoplegia of lower limb following cerebral infarction affecting left non-dominant side: Secondary | ICD-10-CM | POA: Diagnosis not present

## 2017-04-15 DIAGNOSIS — I10 Essential (primary) hypertension: Secondary | ICD-10-CM | POA: Diagnosis not present

## 2017-04-15 DIAGNOSIS — K449 Diaphragmatic hernia without obstruction or gangrene: Secondary | ICD-10-CM | POA: Diagnosis not present

## 2017-04-16 DIAGNOSIS — I69344 Monoplegia of lower limb following cerebral infarction affecting left non-dominant side: Secondary | ICD-10-CM | POA: Diagnosis not present

## 2017-04-16 DIAGNOSIS — Z7901 Long term (current) use of anticoagulants: Secondary | ICD-10-CM | POA: Diagnosis not present

## 2017-04-16 DIAGNOSIS — K449 Diaphragmatic hernia without obstruction or gangrene: Secondary | ICD-10-CM | POA: Diagnosis not present

## 2017-04-16 DIAGNOSIS — Z79899 Other long term (current) drug therapy: Secondary | ICD-10-CM | POA: Diagnosis not present

## 2017-04-16 DIAGNOSIS — G2581 Restless legs syndrome: Secondary | ICD-10-CM | POA: Diagnosis not present

## 2017-04-16 DIAGNOSIS — M810 Age-related osteoporosis without current pathological fracture: Secondary | ICD-10-CM | POA: Diagnosis not present

## 2017-04-16 DIAGNOSIS — R29898 Other symptoms and signs involving the musculoskeletal system: Secondary | ICD-10-CM | POA: Diagnosis not present

## 2017-04-16 DIAGNOSIS — I480 Paroxysmal atrial fibrillation: Secondary | ICD-10-CM | POA: Diagnosis not present

## 2017-04-16 DIAGNOSIS — Z950 Presence of cardiac pacemaker: Secondary | ICD-10-CM | POA: Diagnosis not present

## 2017-04-16 DIAGNOSIS — E559 Vitamin D deficiency, unspecified: Secondary | ICD-10-CM | POA: Diagnosis not present

## 2017-04-16 DIAGNOSIS — Z09 Encounter for follow-up examination after completed treatment for conditions other than malignant neoplasm: Secondary | ICD-10-CM | POA: Diagnosis not present

## 2017-04-16 DIAGNOSIS — E039 Hypothyroidism, unspecified: Secondary | ICD-10-CM | POA: Diagnosis not present

## 2017-04-16 DIAGNOSIS — I10 Essential (primary) hypertension: Secondary | ICD-10-CM | POA: Diagnosis not present

## 2017-04-16 DIAGNOSIS — N390 Urinary tract infection, site not specified: Secondary | ICD-10-CM | POA: Diagnosis not present

## 2017-04-16 DIAGNOSIS — Z6824 Body mass index (BMI) 24.0-24.9, adult: Secondary | ICD-10-CM | POA: Diagnosis not present

## 2017-04-16 DIAGNOSIS — E782 Mixed hyperlipidemia: Secondary | ICD-10-CM | POA: Diagnosis not present

## 2017-04-17 DIAGNOSIS — Z7901 Long term (current) use of anticoagulants: Secondary | ICD-10-CM | POA: Diagnosis not present

## 2017-04-17 DIAGNOSIS — I69344 Monoplegia of lower limb following cerebral infarction affecting left non-dominant side: Secondary | ICD-10-CM | POA: Diagnosis not present

## 2017-04-17 DIAGNOSIS — I10 Essential (primary) hypertension: Secondary | ICD-10-CM | POA: Diagnosis not present

## 2017-04-17 DIAGNOSIS — Z950 Presence of cardiac pacemaker: Secondary | ICD-10-CM | POA: Diagnosis not present

## 2017-04-17 DIAGNOSIS — K449 Diaphragmatic hernia without obstruction or gangrene: Secondary | ICD-10-CM | POA: Diagnosis not present

## 2017-04-17 DIAGNOSIS — I480 Paroxysmal atrial fibrillation: Secondary | ICD-10-CM | POA: Diagnosis not present

## 2017-04-20 DIAGNOSIS — I482 Chronic atrial fibrillation: Secondary | ICD-10-CM | POA: Diagnosis not present

## 2017-04-20 DIAGNOSIS — Z9181 History of falling: Secondary | ICD-10-CM | POA: Diagnosis not present

## 2017-04-20 DIAGNOSIS — I480 Paroxysmal atrial fibrillation: Secondary | ICD-10-CM | POA: Diagnosis not present

## 2017-04-20 DIAGNOSIS — Z950 Presence of cardiac pacemaker: Secondary | ICD-10-CM | POA: Diagnosis not present

## 2017-04-20 DIAGNOSIS — N179 Acute kidney failure, unspecified: Secondary | ICD-10-CM

## 2017-04-20 DIAGNOSIS — R29898 Other symptoms and signs involving the musculoskeletal system: Secondary | ICD-10-CM | POA: Diagnosis not present

## 2017-04-20 DIAGNOSIS — Z8673 Personal history of transient ischemic attack (TIA), and cerebral infarction without residual deficits: Secondary | ICD-10-CM | POA: Diagnosis not present

## 2017-04-20 DIAGNOSIS — I10 Essential (primary) hypertension: Secondary | ICD-10-CM | POA: Diagnosis not present

## 2017-04-20 DIAGNOSIS — I69344 Monoplegia of lower limb following cerebral infarction affecting left non-dominant side: Secondary | ICD-10-CM | POA: Diagnosis not present

## 2017-04-20 DIAGNOSIS — E871 Hypo-osmolality and hyponatremia: Secondary | ICD-10-CM | POA: Diagnosis not present

## 2017-04-20 DIAGNOSIS — N390 Urinary tract infection, site not specified: Secondary | ICD-10-CM | POA: Diagnosis not present

## 2017-04-20 DIAGNOSIS — R0789 Other chest pain: Secondary | ICD-10-CM | POA: Diagnosis not present

## 2017-04-20 DIAGNOSIS — Z7901 Long term (current) use of anticoagulants: Secondary | ICD-10-CM | POA: Diagnosis not present

## 2017-04-20 DIAGNOSIS — K5900 Constipation, unspecified: Secondary | ICD-10-CM | POA: Diagnosis not present

## 2017-04-20 DIAGNOSIS — I719 Aortic aneurysm of unspecified site, without rupture: Secondary | ICD-10-CM | POA: Diagnosis not present

## 2017-04-20 DIAGNOSIS — E785 Hyperlipidemia, unspecified: Secondary | ICD-10-CM | POA: Diagnosis not present

## 2017-04-20 DIAGNOSIS — G250 Essential tremor: Secondary | ICD-10-CM | POA: Diagnosis not present

## 2017-04-20 DIAGNOSIS — R110 Nausea: Secondary | ICD-10-CM | POA: Diagnosis not present

## 2017-04-20 DIAGNOSIS — J9811 Atelectasis: Secondary | ICD-10-CM | POA: Diagnosis not present

## 2017-04-20 DIAGNOSIS — K449 Diaphragmatic hernia without obstruction or gangrene: Secondary | ICD-10-CM | POA: Diagnosis not present

## 2017-04-20 HISTORY — DX: Acute kidney failure, unspecified: N17.9

## 2017-04-24 DIAGNOSIS — K449 Diaphragmatic hernia without obstruction or gangrene: Secondary | ICD-10-CM | POA: Diagnosis not present

## 2017-04-24 DIAGNOSIS — I69344 Monoplegia of lower limb following cerebral infarction affecting left non-dominant side: Secondary | ICD-10-CM | POA: Diagnosis not present

## 2017-04-24 DIAGNOSIS — Z950 Presence of cardiac pacemaker: Secondary | ICD-10-CM | POA: Diagnosis not present

## 2017-04-24 DIAGNOSIS — Z7901 Long term (current) use of anticoagulants: Secondary | ICD-10-CM | POA: Diagnosis not present

## 2017-04-24 DIAGNOSIS — I480 Paroxysmal atrial fibrillation: Secondary | ICD-10-CM | POA: Diagnosis not present

## 2017-04-24 DIAGNOSIS — I10 Essential (primary) hypertension: Secondary | ICD-10-CM | POA: Diagnosis not present

## 2017-04-27 DIAGNOSIS — I69344 Monoplegia of lower limb following cerebral infarction affecting left non-dominant side: Secondary | ICD-10-CM | POA: Diagnosis not present

## 2017-04-27 DIAGNOSIS — I10 Essential (primary) hypertension: Secondary | ICD-10-CM | POA: Diagnosis not present

## 2017-04-27 DIAGNOSIS — Z7901 Long term (current) use of anticoagulants: Secondary | ICD-10-CM | POA: Diagnosis not present

## 2017-04-27 DIAGNOSIS — I480 Paroxysmal atrial fibrillation: Secondary | ICD-10-CM | POA: Diagnosis not present

## 2017-04-27 DIAGNOSIS — K449 Diaphragmatic hernia without obstruction or gangrene: Secondary | ICD-10-CM | POA: Diagnosis not present

## 2017-04-27 DIAGNOSIS — Z950 Presence of cardiac pacemaker: Secondary | ICD-10-CM | POA: Diagnosis not present

## 2017-04-30 DIAGNOSIS — R600 Localized edema: Secondary | ICD-10-CM | POA: Diagnosis not present

## 2017-04-30 DIAGNOSIS — Z79899 Other long term (current) drug therapy: Secondary | ICD-10-CM | POA: Diagnosis not present

## 2017-04-30 DIAGNOSIS — I10 Essential (primary) hypertension: Secondary | ICD-10-CM | POA: Diagnosis not present

## 2017-04-30 DIAGNOSIS — E871 Hypo-osmolality and hyponatremia: Secondary | ICD-10-CM | POA: Diagnosis not present

## 2017-04-30 DIAGNOSIS — Z6824 Body mass index (BMI) 24.0-24.9, adult: Secondary | ICD-10-CM | POA: Diagnosis not present

## 2017-04-30 DIAGNOSIS — N390 Urinary tract infection, site not specified: Secondary | ICD-10-CM | POA: Diagnosis not present

## 2017-04-30 DIAGNOSIS — Z09 Encounter for follow-up examination after completed treatment for conditions other than malignant neoplasm: Secondary | ICD-10-CM | POA: Diagnosis not present

## 2017-04-30 DIAGNOSIS — R300 Dysuria: Secondary | ICD-10-CM | POA: Diagnosis not present

## 2017-05-01 DIAGNOSIS — Z950 Presence of cardiac pacemaker: Secondary | ICD-10-CM | POA: Diagnosis not present

## 2017-05-01 DIAGNOSIS — I10 Essential (primary) hypertension: Secondary | ICD-10-CM | POA: Diagnosis not present

## 2017-05-01 DIAGNOSIS — I69344 Monoplegia of lower limb following cerebral infarction affecting left non-dominant side: Secondary | ICD-10-CM | POA: Diagnosis not present

## 2017-05-01 DIAGNOSIS — E871 Hypo-osmolality and hyponatremia: Secondary | ICD-10-CM | POA: Diagnosis not present

## 2017-05-01 DIAGNOSIS — Z7901 Long term (current) use of anticoagulants: Secondary | ICD-10-CM | POA: Diagnosis not present

## 2017-05-01 DIAGNOSIS — I480 Paroxysmal atrial fibrillation: Secondary | ICD-10-CM | POA: Diagnosis not present

## 2017-05-01 DIAGNOSIS — K449 Diaphragmatic hernia without obstruction or gangrene: Secondary | ICD-10-CM | POA: Diagnosis not present

## 2017-05-04 DIAGNOSIS — I69344 Monoplegia of lower limb following cerebral infarction affecting left non-dominant side: Secondary | ICD-10-CM | POA: Diagnosis not present

## 2017-05-04 DIAGNOSIS — K449 Diaphragmatic hernia without obstruction or gangrene: Secondary | ICD-10-CM | POA: Diagnosis not present

## 2017-05-04 DIAGNOSIS — I10 Essential (primary) hypertension: Secondary | ICD-10-CM | POA: Diagnosis not present

## 2017-05-04 DIAGNOSIS — Z950 Presence of cardiac pacemaker: Secondary | ICD-10-CM | POA: Diagnosis not present

## 2017-05-04 DIAGNOSIS — I480 Paroxysmal atrial fibrillation: Secondary | ICD-10-CM | POA: Diagnosis not present

## 2017-05-04 DIAGNOSIS — Z7901 Long term (current) use of anticoagulants: Secondary | ICD-10-CM | POA: Diagnosis not present

## 2017-05-05 DIAGNOSIS — I69344 Monoplegia of lower limb following cerebral infarction affecting left non-dominant side: Secondary | ICD-10-CM | POA: Diagnosis not present

## 2017-05-05 DIAGNOSIS — Z950 Presence of cardiac pacemaker: Secondary | ICD-10-CM | POA: Diagnosis not present

## 2017-05-05 DIAGNOSIS — I10 Essential (primary) hypertension: Secondary | ICD-10-CM | POA: Diagnosis not present

## 2017-05-05 DIAGNOSIS — I480 Paroxysmal atrial fibrillation: Secondary | ICD-10-CM | POA: Diagnosis not present

## 2017-05-05 DIAGNOSIS — K449 Diaphragmatic hernia without obstruction or gangrene: Secondary | ICD-10-CM | POA: Diagnosis not present

## 2017-05-05 DIAGNOSIS — Z7901 Long term (current) use of anticoagulants: Secondary | ICD-10-CM | POA: Diagnosis not present

## 2017-05-07 DIAGNOSIS — I480 Paroxysmal atrial fibrillation: Secondary | ICD-10-CM | POA: Diagnosis not present

## 2017-05-07 DIAGNOSIS — Z7901 Long term (current) use of anticoagulants: Secondary | ICD-10-CM | POA: Diagnosis not present

## 2017-05-07 DIAGNOSIS — I10 Essential (primary) hypertension: Secondary | ICD-10-CM | POA: Diagnosis not present

## 2017-05-07 DIAGNOSIS — I69344 Monoplegia of lower limb following cerebral infarction affecting left non-dominant side: Secondary | ICD-10-CM | POA: Diagnosis not present

## 2017-05-07 DIAGNOSIS — Z950 Presence of cardiac pacemaker: Secondary | ICD-10-CM | POA: Diagnosis not present

## 2017-05-07 DIAGNOSIS — K449 Diaphragmatic hernia without obstruction or gangrene: Secondary | ICD-10-CM | POA: Diagnosis not present

## 2017-05-08 DIAGNOSIS — K449 Diaphragmatic hernia without obstruction or gangrene: Secondary | ICD-10-CM | POA: Diagnosis not present

## 2017-05-08 DIAGNOSIS — I69344 Monoplegia of lower limb following cerebral infarction affecting left non-dominant side: Secondary | ICD-10-CM | POA: Diagnosis not present

## 2017-05-08 DIAGNOSIS — I480 Paroxysmal atrial fibrillation: Secondary | ICD-10-CM | POA: Diagnosis not present

## 2017-05-08 DIAGNOSIS — Z950 Presence of cardiac pacemaker: Secondary | ICD-10-CM | POA: Diagnosis not present

## 2017-05-08 DIAGNOSIS — I10 Essential (primary) hypertension: Secondary | ICD-10-CM | POA: Diagnosis not present

## 2017-05-08 DIAGNOSIS — Z7901 Long term (current) use of anticoagulants: Secondary | ICD-10-CM | POA: Diagnosis not present

## 2017-05-11 DIAGNOSIS — I10 Essential (primary) hypertension: Secondary | ICD-10-CM | POA: Diagnosis not present

## 2017-05-11 DIAGNOSIS — I480 Paroxysmal atrial fibrillation: Secondary | ICD-10-CM | POA: Diagnosis not present

## 2017-05-11 DIAGNOSIS — Z7901 Long term (current) use of anticoagulants: Secondary | ICD-10-CM | POA: Diagnosis not present

## 2017-05-11 DIAGNOSIS — Z950 Presence of cardiac pacemaker: Secondary | ICD-10-CM | POA: Diagnosis not present

## 2017-05-11 DIAGNOSIS — I69344 Monoplegia of lower limb following cerebral infarction affecting left non-dominant side: Secondary | ICD-10-CM | POA: Diagnosis not present

## 2017-05-11 DIAGNOSIS — K449 Diaphragmatic hernia without obstruction or gangrene: Secondary | ICD-10-CM | POA: Diagnosis not present

## 2017-05-13 DIAGNOSIS — M545 Low back pain: Secondary | ICD-10-CM | POA: Diagnosis not present

## 2017-05-13 DIAGNOSIS — Z6824 Body mass index (BMI) 24.0-24.9, adult: Secondary | ICD-10-CM | POA: Diagnosis not present

## 2017-05-13 DIAGNOSIS — I639 Cerebral infarction, unspecified: Secondary | ICD-10-CM | POA: Diagnosis not present

## 2017-05-13 DIAGNOSIS — E559 Vitamin D deficiency, unspecified: Secondary | ICD-10-CM | POA: Diagnosis not present

## 2017-05-13 DIAGNOSIS — F418 Other specified anxiety disorders: Secondary | ICD-10-CM | POA: Diagnosis not present

## 2017-05-13 DIAGNOSIS — I10 Essential (primary) hypertension: Secondary | ICD-10-CM | POA: Diagnosis not present

## 2017-05-13 DIAGNOSIS — E785 Hyperlipidemia, unspecified: Secondary | ICD-10-CM | POA: Diagnosis not present

## 2017-05-13 DIAGNOSIS — K219 Gastro-esophageal reflux disease without esophagitis: Secondary | ICD-10-CM | POA: Diagnosis not present

## 2017-05-13 DIAGNOSIS — J449 Chronic obstructive pulmonary disease, unspecified: Secondary | ICD-10-CM | POA: Diagnosis not present

## 2017-05-15 DIAGNOSIS — I480 Paroxysmal atrial fibrillation: Secondary | ICD-10-CM | POA: Diagnosis not present

## 2017-05-15 DIAGNOSIS — Z950 Presence of cardiac pacemaker: Secondary | ICD-10-CM | POA: Diagnosis not present

## 2017-05-15 DIAGNOSIS — I69344 Monoplegia of lower limb following cerebral infarction affecting left non-dominant side: Secondary | ICD-10-CM | POA: Diagnosis not present

## 2017-05-15 DIAGNOSIS — I10 Essential (primary) hypertension: Secondary | ICD-10-CM | POA: Diagnosis not present

## 2017-05-15 DIAGNOSIS — K449 Diaphragmatic hernia without obstruction or gangrene: Secondary | ICD-10-CM | POA: Diagnosis not present

## 2017-05-15 DIAGNOSIS — Z7901 Long term (current) use of anticoagulants: Secondary | ICD-10-CM | POA: Diagnosis not present

## 2017-05-19 DIAGNOSIS — I69344 Monoplegia of lower limb following cerebral infarction affecting left non-dominant side: Secondary | ICD-10-CM | POA: Diagnosis not present

## 2017-05-19 DIAGNOSIS — I10 Essential (primary) hypertension: Secondary | ICD-10-CM | POA: Diagnosis not present

## 2017-05-19 DIAGNOSIS — I480 Paroxysmal atrial fibrillation: Secondary | ICD-10-CM | POA: Diagnosis not present

## 2017-05-19 DIAGNOSIS — Z7901 Long term (current) use of anticoagulants: Secondary | ICD-10-CM | POA: Diagnosis not present

## 2017-05-19 DIAGNOSIS — K449 Diaphragmatic hernia without obstruction or gangrene: Secondary | ICD-10-CM | POA: Diagnosis not present

## 2017-05-19 DIAGNOSIS — Z950 Presence of cardiac pacemaker: Secondary | ICD-10-CM | POA: Diagnosis not present

## 2017-05-20 DIAGNOSIS — Z0100 Encounter for examination of eyes and vision without abnormal findings: Secondary | ICD-10-CM | POA: Diagnosis not present

## 2017-05-20 DIAGNOSIS — E785 Hyperlipidemia, unspecified: Secondary | ICD-10-CM | POA: Diagnosis not present

## 2017-05-20 DIAGNOSIS — I10 Essential (primary) hypertension: Secondary | ICD-10-CM | POA: Diagnosis not present

## 2017-05-20 DIAGNOSIS — Z1389 Encounter for screening for other disorder: Secondary | ICD-10-CM | POA: Diagnosis not present

## 2017-05-20 DIAGNOSIS — Z7901 Long term (current) use of anticoagulants: Secondary | ICD-10-CM | POA: Diagnosis not present

## 2017-05-20 DIAGNOSIS — Z9181 History of falling: Secondary | ICD-10-CM | POA: Diagnosis not present

## 2017-05-20 DIAGNOSIS — I480 Paroxysmal atrial fibrillation: Secondary | ICD-10-CM | POA: Diagnosis not present

## 2017-05-20 DIAGNOSIS — I69344 Monoplegia of lower limb following cerebral infarction affecting left non-dominant side: Secondary | ICD-10-CM | POA: Diagnosis not present

## 2017-05-20 DIAGNOSIS — Z1231 Encounter for screening mammogram for malignant neoplasm of breast: Secondary | ICD-10-CM | POA: Diagnosis not present

## 2017-05-20 DIAGNOSIS — Z950 Presence of cardiac pacemaker: Secondary | ICD-10-CM | POA: Diagnosis not present

## 2017-05-20 DIAGNOSIS — Z0000 Encounter for general adult medical examination without abnormal findings: Secondary | ICD-10-CM | POA: Diagnosis not present

## 2017-05-20 DIAGNOSIS — K449 Diaphragmatic hernia without obstruction or gangrene: Secondary | ICD-10-CM | POA: Diagnosis not present

## 2017-05-21 ENCOUNTER — Ambulatory Visit (INDEPENDENT_AMBULATORY_CARE_PROVIDER_SITE_OTHER): Payer: Medicare HMO | Admitting: Cardiology

## 2017-05-21 ENCOUNTER — Encounter: Payer: Self-pay | Admitting: Cardiology

## 2017-05-21 VITALS — BP 114/60 | HR 64 | Resp 10 | Ht 60.0 in | Wt 121.0 lb

## 2017-05-21 DIAGNOSIS — Z950 Presence of cardiac pacemaker: Secondary | ICD-10-CM

## 2017-05-21 DIAGNOSIS — I6522 Occlusion and stenosis of left carotid artery: Secondary | ICD-10-CM

## 2017-05-21 DIAGNOSIS — E782 Mixed hyperlipidemia: Secondary | ICD-10-CM

## 2017-05-21 DIAGNOSIS — I10 Essential (primary) hypertension: Secondary | ICD-10-CM

## 2017-05-21 DIAGNOSIS — Z9889 Other specified postprocedural states: Secondary | ICD-10-CM | POA: Diagnosis not present

## 2017-05-21 NOTE — Addendum Note (Signed)
Addended by: Warner Mccreedy E on: 05/21/2017 10:23 AM   Modules accepted: Orders

## 2017-05-21 NOTE — Progress Notes (Addendum)
Cardiology Office Note:    Date:  05/21/2017   ID:  Nichole Nelson, DOB [birth year], MRN [id number]  PCP:  Nicholos Johns, MD  Cardiologist:  Jenne Campus, MD    Referring MD: No ref. provider found   Chief Complaint  Patient presents with  . Follow-up  She is doing well  History of Present Illness:    Nichole Nelson is a 82 y.o. female  paroxysmal atrial fibrillation, Medtronic pacemaker, peripheral vascular disease. Recently had to hospitalization because of profound weakness and fatigue. Significant electrolyte of the multi were identified and corrected. Since that time she seems to be doing fine. She is living very sedentary lifestyle. Denies having any chest pain, tightness, squeezing, pressure, burning chest.  Past Medical History:  Diagnosis Date  . COPD (chronic obstructive pulmonary disease) ([place])   . Hyperlipidemia   . Hypertension   . Thyroid disease     Past Surgical History:  Procedure Laterality Date  . ABDOMINAL HYSTERECTOMY    . AORTIC VALVE REPAIR    . APPENDECTOMY    . CORONARY ARTERY BYPASS GRAFT    . HERNIA REPAIR    . INSERT / REPLACE / REMOVE PACEMAKER     Medtronic    Current Medications: Current Meds  Medication Sig  . albuterol (VENTOLIN HFA) 108 (90 Base) MCG/ACT inhaler Inhale 2 puffs into the lungs as needed for wheezing.  Marland Kitchen amLODipine (NORVASC) 5 MG tablet Take 5 mg by mouth daily.  Marland Kitchen apixaban (ELIQUIS) 2.5 MG TABS tablet Take 2.5 mg by mouth 2 (two) times daily.  . Cholecalciferol (VITAMIN D3) 2000 units capsule Take 1 capsule by mouth daily.  . clonazePAM (KLONOPIN) 0.5 MG tablet Take 0.5 mg by mouth 2 (two) times daily.  Marland Kitchen dexlansoprazole (DEXILANT) 60 MG capsule Take 60 mg by mouth daily.  Marland Kitchen escitalopram (LEXAPRO) 10 MG tablet Take 10 mg by mouth daily.  . ferrous sulfate 325 (65 FE) MG tablet Take 325 mg by mouth daily.  . fluticasone furoate-vilanterol (BREO ELLIPTA) 100-25 MCG/INH AEPB Inhale 1 puff into the lungs  daily.  Marland Kitchen gabapentin (NEURONTIN) 100 MG capsule Take 100 mg by mouth 4 (four) times daily.  Marland Kitchen levothyroxine (SYNTHROID, LEVOTHROID) 88 MCG tablet Take 1 tablet by mouth daily.  . metoprolol tartrate (LOPRESSOR) 50 MG tablet Take 1 tablet by mouth 2 (two) times daily.  . mometasone (NASONEX) 50 MCG/ACT nasal spray Place 1 spray into both nostrils daily.  . montelukast (SINGULAIR) 10 MG tablet Take 10 mg by mouth daily.  . Omega-3 Fatty Acids (FISH OIL) 1000 MG CAPS Take 2,000 mg by mouth 2 (two) times daily.  . ondansetron (ZOFRAN) 4 MG tablet Take 4 mg by mouth every 8 (eight) hours as needed for nausea/vomiting.  . pravastatin (PRAVACHOL) 80 MG tablet Take 80 mg by mouth daily.  . traMADol (ULTRAM) 50 MG tablet Take 100 mg by mouth every 8 (eight) hours as needed for pain.     Allergies:   Iodinated diagnostic agents; Antihistamines, chlorpheniramine-type; and Meperidine   Social History   Social History  . Marital status: Widowed    Spouse name: N/A  . Number of children: N/A  . Years of education: N/A   Social History Main Topics  . Smoking status: Never Smoker  . Smokeless tobacco: Never Used  . Alcohol use No  . Drug use: No  . Sexual activity: Not Asked   Other Topics Concern  . None   Social History Narrative  . None  Family History: The patient's family history includes Aneurysm in her father; Kidney failure in her mother; Stroke in her mother. ROS:   Please see the history of present illness.    All 14 point review of systems negative except as described per history of present illness  EKGs/Labs/Other Studies Reviewed:      Recent Labs: No results found for requested labs within last 8760 hours.  Recent Lipid Panel No results found for: CHOL, TRIG, HDL, CHOLHDL, VLDL, LDLCALC, LDLDIRECT  Physical Exam:    VS:  BP 114/60   Pulse 64   Resp 10   Ht 5' (1.524 m)   Wt 121 lb (54.9 kg) Comment: Reported, in wheelchair  BMI 23.63 kg/m     Wt Readings  from Last 3 Encounters:  05/21/17 121 lb (54.9 kg)     GEN:  Well nourished, well developed in no acute distress HEENT: Normal NECK: No JVD; No carotid bruits LYMPHATICS: No lymphadenopathy CARDIAC: RRR, There is soft diastolic murmur grade 1/6 best heard at the right upper portion of the sternum., no rubs, no gallops RESPIRATORY:  Clear to auscultation without rales, wheezing or rhonchi  ABDOMEN: Soft, non-tender, non-distended MUSCULOSKELETAL:  No edema; No deformity  SKIN: Warm and dry LOWER EXTREMITIES: no swelling NEUROLOGIC:  Alert and oriented x 3 PSYCHIATRIC:  Normal affect   ASSESSMENT:    1. Essential hypertension   2. Mixed hyperlipidemia   3. S/P AV nodal ablation   4. Pacemaker   5. Stenosis of left carotid artery    PLAN:    In order of problems listed above:  1. Essential hypertension: Blood pressure appears to be well controlled we'll continue present management. Line mixed dyslipidemia.  2. Dyslipidemia: She is on pravastatin which I will continue, we'll call primary care physician to get fasting lipid profile. 3. Status post AV nodal ablation, pacemaker which is a Medtronic device will interrogate device today and we will enroll her in our device monitor clinically. 4.Stenosis of left carotid artery, she'll be scheduled to have carotid ultrasound. 5. Paroxysmal atrial fibrillation: We'll interrogate device today to check the rhythm. Previously I tried small dose of amiodarone to see if I can maintain her in sinus rhythm however that attempts failed therefore I think she simply will be best served by anticoagulation and keep her heart rate under control. Pacemaker has been interrogated still good battery status. IF all appears to be going high. I will check her Chem-7 to see if there is room to him increased dose of diuretics.  Medication Adjustments/Labs and Tests Ordered: Current medicines are reviewed at length with the patient today.  Concerns regarding  medicines are outlined above.  No orders of the defined types were placed in this encounter.  Medication changes: No orders of the defined types were placed in this encounter.   Signed, Park Liter, MD, [facility] 05/21/2017 10:06 AM    [place]

## 2017-05-21 NOTE — Patient Instructions (Signed)
Medication Instructions:  Your physician recommends that you continue on your current medications as directed. Please refer to the Current Medication list given to you today.   Labwork: Your physician recommends that you return for lab work in: today. BMP   Testing/Procedures: Your physician has requested that you have a carotid duplex. This test is an ultrasound of the carotid arteries in your neck. It looks at blood flow through these arteries that supply the brain with blood. Allow one hour for this exam. There are no restrictions or special instructions.    Follow-Up: Your physician wants you to follow-up in: 2 months. You will receive a reminder letter in the mail two months in advance. If you don't receive a letter, please call our office to schedule the follow-up appointment.   Any Other Special Instructions Will Be Listed Below (If Applicable).     If you need a refill on your cardiac medications before your next appointment, please call your pharmacy.

## 2017-05-22 DIAGNOSIS — Z950 Presence of cardiac pacemaker: Secondary | ICD-10-CM | POA: Diagnosis not present

## 2017-05-22 DIAGNOSIS — I480 Paroxysmal atrial fibrillation: Secondary | ICD-10-CM | POA: Diagnosis not present

## 2017-05-22 DIAGNOSIS — I69344 Monoplegia of lower limb following cerebral infarction affecting left non-dominant side: Secondary | ICD-10-CM | POA: Diagnosis not present

## 2017-05-22 DIAGNOSIS — I10 Essential (primary) hypertension: Secondary | ICD-10-CM | POA: Diagnosis not present

## 2017-05-22 DIAGNOSIS — Z7901 Long term (current) use of anticoagulants: Secondary | ICD-10-CM | POA: Diagnosis not present

## 2017-05-22 DIAGNOSIS — K449 Diaphragmatic hernia without obstruction or gangrene: Secondary | ICD-10-CM | POA: Diagnosis not present

## 2017-05-22 LAB — BASIC METABOLIC PANEL
BUN/Creatinine Ratio: 15 (ref 12–28)
BUN: 17 mg/dL (ref 8–27)
CALCIUM: 9.6 mg/dL (ref 8.7–10.3)
CO2: 24 mmol/L (ref 20–29)
CREATININE: 1.15 mg/dL — AB (ref 0.57–1.00)
Chloride: 100 mmol/L (ref 96–106)
GFR calc Af Amer: 52 mL/min/{1.73_m2} — ABNORMAL LOW (ref >59)
GFR, EST NON AFRICAN AMERICAN: 45 mL/min/{1.73_m2} — AB (ref >59)
GLUCOSE: 113 mg/dL — AB (ref 65–99)
Potassium: 5.2 mmol/L (ref 3.5–5.2)
SODIUM: 138 mmol/L (ref 134–144)

## 2017-05-25 ENCOUNTER — Encounter (HOSPITAL_COMMUNITY): Payer: Medicaid Other

## 2017-05-25 DIAGNOSIS — I6523 Occlusion and stenosis of bilateral carotid arteries: Secondary | ICD-10-CM | POA: Diagnosis not present

## 2017-05-25 DIAGNOSIS — Z1231 Encounter for screening mammogram for malignant neoplasm of breast: Secondary | ICD-10-CM | POA: Diagnosis not present

## 2017-05-25 DIAGNOSIS — I6522 Occlusion and stenosis of left carotid artery: Secondary | ICD-10-CM | POA: Diagnosis not present

## 2017-05-26 ENCOUNTER — Other Ambulatory Visit: Payer: Self-pay

## 2017-05-26 DIAGNOSIS — I480 Paroxysmal atrial fibrillation: Secondary | ICD-10-CM | POA: Diagnosis not present

## 2017-05-26 DIAGNOSIS — Z7901 Long term (current) use of anticoagulants: Secondary | ICD-10-CM | POA: Diagnosis not present

## 2017-05-26 DIAGNOSIS — I69344 Monoplegia of lower limb following cerebral infarction affecting left non-dominant side: Secondary | ICD-10-CM | POA: Diagnosis not present

## 2017-05-26 DIAGNOSIS — K449 Diaphragmatic hernia without obstruction or gangrene: Secondary | ICD-10-CM | POA: Diagnosis not present

## 2017-05-26 DIAGNOSIS — Z950 Presence of cardiac pacemaker: Secondary | ICD-10-CM | POA: Diagnosis not present

## 2017-05-26 DIAGNOSIS — I10 Essential (primary) hypertension: Secondary | ICD-10-CM | POA: Diagnosis not present

## 2017-05-26 NOTE — Patient Outreach (Signed)
[facility]) Care Management  05/26/2017  Nichole Nelson 11/10/1934 [id number]  TELEPHONE SCREENING Referral date: 05/26/17 Referral source:  EMMI prevent Referral reason: EMMI prevent risk score Insurance: [employer]  SUBJECTIVE: Telephone call to patient regarding EMMI prevent referral. Discussed [facility] care management program with patient.  Patient states she lives with her son who assist with her care. Patient states she has COPD and atrial fibrillation. Patient states she has a pacemaker. Patient states she has an appointment in August 2018 to see her pulmonologist. Patient states she takes her medications as prescribed.  Denies being on oxygen or having any home services. Patient states she was in the hospital over a month ago due to her sodium level being low. Patient reports her sodium level is back to normal.  RNCM offered [facility] care management services to patient. Patient refused services. Patient verbally agreed to received [facility] outreach letter/ brochure.    PLAN: RNCM will refer patient to care management assistant to close due to refusal of services. RNCM will send patient [name] outreach letter and brochure to patient. RNCM will send patients primary MD closure notification.  Quinn Plowman RN,BSN,CCM [facility] Telephonic  [phone]

## 2017-05-27 DIAGNOSIS — I10 Essential (primary) hypertension: Secondary | ICD-10-CM | POA: Diagnosis not present

## 2017-05-27 DIAGNOSIS — I480 Paroxysmal atrial fibrillation: Secondary | ICD-10-CM | POA: Diagnosis not present

## 2017-05-27 DIAGNOSIS — Z950 Presence of cardiac pacemaker: Secondary | ICD-10-CM | POA: Diagnosis not present

## 2017-05-27 DIAGNOSIS — I69344 Monoplegia of lower limb following cerebral infarction affecting left non-dominant side: Secondary | ICD-10-CM | POA: Diagnosis not present

## 2017-05-27 DIAGNOSIS — K449 Diaphragmatic hernia without obstruction or gangrene: Secondary | ICD-10-CM | POA: Diagnosis not present

## 2017-05-27 DIAGNOSIS — Z7901 Long term (current) use of anticoagulants: Secondary | ICD-10-CM | POA: Diagnosis not present

## 2017-06-02 DIAGNOSIS — Z950 Presence of cardiac pacemaker: Secondary | ICD-10-CM | POA: Diagnosis not present

## 2017-06-02 DIAGNOSIS — I10 Essential (primary) hypertension: Secondary | ICD-10-CM | POA: Diagnosis not present

## 2017-06-02 DIAGNOSIS — I69344 Monoplegia of lower limb following cerebral infarction affecting left non-dominant side: Secondary | ICD-10-CM | POA: Diagnosis not present

## 2017-06-02 DIAGNOSIS — K449 Diaphragmatic hernia without obstruction or gangrene: Secondary | ICD-10-CM | POA: Diagnosis not present

## 2017-06-02 DIAGNOSIS — Z7901 Long term (current) use of anticoagulants: Secondary | ICD-10-CM | POA: Diagnosis not present

## 2017-06-02 DIAGNOSIS — I480 Paroxysmal atrial fibrillation: Secondary | ICD-10-CM | POA: Diagnosis not present

## 2017-06-04 ENCOUNTER — Other Ambulatory Visit: Payer: Self-pay

## 2017-06-04 DIAGNOSIS — I10 Essential (primary) hypertension: Secondary | ICD-10-CM | POA: Diagnosis not present

## 2017-06-04 DIAGNOSIS — K449 Diaphragmatic hernia without obstruction or gangrene: Secondary | ICD-10-CM | POA: Diagnosis not present

## 2017-06-04 DIAGNOSIS — Z7901 Long term (current) use of anticoagulants: Secondary | ICD-10-CM | POA: Diagnosis not present

## 2017-06-04 DIAGNOSIS — I69344 Monoplegia of lower limb following cerebral infarction affecting left non-dominant side: Secondary | ICD-10-CM | POA: Diagnosis not present

## 2017-06-04 DIAGNOSIS — Z950 Presence of cardiac pacemaker: Secondary | ICD-10-CM | POA: Diagnosis not present

## 2017-06-04 DIAGNOSIS — I480 Paroxysmal atrial fibrillation: Secondary | ICD-10-CM | POA: Diagnosis not present

## 2017-06-08 DIAGNOSIS — K449 Diaphragmatic hernia without obstruction or gangrene: Secondary | ICD-10-CM | POA: Diagnosis not present

## 2017-06-08 DIAGNOSIS — Z7901 Long term (current) use of anticoagulants: Secondary | ICD-10-CM | POA: Diagnosis not present

## 2017-06-08 DIAGNOSIS — Z950 Presence of cardiac pacemaker: Secondary | ICD-10-CM | POA: Diagnosis not present

## 2017-06-08 DIAGNOSIS — I69344 Monoplegia of lower limb following cerebral infarction affecting left non-dominant side: Secondary | ICD-10-CM | POA: Diagnosis not present

## 2017-06-08 DIAGNOSIS — I10 Essential (primary) hypertension: Secondary | ICD-10-CM | POA: Diagnosis not present

## 2017-06-08 DIAGNOSIS — I480 Paroxysmal atrial fibrillation: Secondary | ICD-10-CM | POA: Diagnosis not present

## 2017-06-17 ENCOUNTER — Ambulatory Visit (INDEPENDENT_AMBULATORY_CARE_PROVIDER_SITE_OTHER): Payer: Medicare HMO | Admitting: Cardiology

## 2017-06-17 ENCOUNTER — Encounter: Payer: Self-pay | Admitting: Cardiology

## 2017-06-17 VITALS — BP 135/63 | HR 76 | Ht 60.0 in | Wt 120.4 lb

## 2017-06-17 DIAGNOSIS — E782 Mixed hyperlipidemia: Secondary | ICD-10-CM

## 2017-06-17 DIAGNOSIS — I10 Essential (primary) hypertension: Secondary | ICD-10-CM | POA: Diagnosis not present

## 2017-06-17 DIAGNOSIS — Z9889 Other specified postprocedural states: Secondary | ICD-10-CM

## 2017-06-17 DIAGNOSIS — I481 Persistent atrial fibrillation: Secondary | ICD-10-CM

## 2017-06-17 DIAGNOSIS — Z950 Presence of cardiac pacemaker: Secondary | ICD-10-CM | POA: Diagnosis not present

## 2017-06-17 DIAGNOSIS — I4819 Other persistent atrial fibrillation: Secondary | ICD-10-CM

## 2017-06-17 NOTE — Progress Notes (Addendum)
Electrophysiology Office Note   Date:  06/17/2017   ID:  [name], [name] 1935-07-31, MRN [id number]  PCP:  Nichole Johns, MD  Cardiologist:  Agustin Cree Primary Electrophysiologist:  Kaylee Trivett Meredith Leeds, MD    Chief Complaint  Patient presents with  . Pacemaker Check    Sick sinus syndrome     History of Present Illness: Nichole Nelson is a 81 y.o. female who is being seen today for the evaluation of atrial fibrillation, at the request of Nichole Johns, MD. Presenting today for electrophysiology evaluation. She has a history of paroxysmal atrial fibrillation, status post AV nodal ablation and Medtronic pacemaker, and peripheral vascular disease. She was hospitalized in June due to fatigue and weakness. Found to be in metabolic disarray which was corrected. Since that time she's been doing well.    Today, she denies symptoms of palpitations, chest pain, orthopnea, PND, lower extremity edema, claudication, dizziness, presyncope, syncope, bleeding, or neurologic sequela. The patient is tolerating medications without difficulties. She does have some shortness of breath and fatigue, but feels like she is at her baseline.   Past Medical History:  Diagnosis Date  . COPD (chronic obstructive pulmonary disease) ([place])   . Hyperlipidemia   . Hypertension   . Thyroid disease    Past Surgical History:  Procedure Laterality Date  . ABDOMINAL HYSTERECTOMY    . AORTIC VALVE REPAIR    . APPENDECTOMY    . CORONARY ARTERY BYPASS GRAFT    . HERNIA REPAIR    . INSERT / REPLACE / REMOVE PACEMAKER     Medtronic     Current Outpatient Prescriptions  Medication Sig Dispense Refill  . albuterol (VENTOLIN HFA) 108 (90 Base) MCG/ACT inhaler Inhale 2 puffs into the lungs as needed for wheezing.    Marland Kitchen amLODipine (NORVASC) 5 MG tablet Take 5 mg by mouth daily.    Marland Kitchen apixaban (ELIQUIS) 2.5 MG TABS tablet Take 2.5 mg by mouth 2 (two) times daily.    . Cholecalciferol (VITAMIN D3) 2000  units capsule Take 1 capsule by mouth daily.    . clonazePAM (KLONOPIN) 0.5 MG tablet Take 0.5 mg by mouth 2 (two) times daily.    Marland Kitchen dexlansoprazole (DEXILANT) 60 MG capsule Take 60 mg by mouth daily.    Marland Kitchen escitalopram (LEXAPRO) 10 MG tablet Take 10 mg by mouth daily.    . ferrous sulfate 325 (65 FE) MG tablet Take 325 mg by mouth daily.    . fluticasone furoate-vilanterol (BREO ELLIPTA) 100-25 MCG/INH AEPB Inhale 1 puff into the lungs daily.    Marland Kitchen gabapentin (NEURONTIN) 100 MG capsule Take 100 mg by mouth 4 (four) times daily.    Marland Kitchen levothyroxine (SYNTHROID, LEVOTHROID) 88 MCG tablet Take 1 tablet by mouth daily.    . metoprolol tartrate (LOPRESSOR) 50 MG tablet Take 1 tablet by mouth 2 (two) times daily.    . mometasone (NASONEX) 50 MCG/ACT nasal spray Place 1 spray into both nostrils daily.    . montelukast (SINGULAIR) 10 MG tablet Take 10 mg by mouth daily.    . Omega-3 Fatty Acids (FISH OIL) 1000 MG CAPS Take 2,000 mg by mouth 2 (two) times daily.    . ondansetron (ZOFRAN) 4 MG tablet Take 4 mg by mouth every 8 (eight) hours as needed for nausea/vomiting.    . pravastatin (PRAVACHOL) 80 MG tablet Take 80 mg by mouth daily.    . traMADol (ULTRAM) 50 MG tablet Take 100 mg by mouth every 8 (eight) hours  as needed for pain.     No current facility-administered medications for this visit.     Allergies:   Iodinated diagnostic agents; Antihistamines, chlorpheniramine-type; and Meperidine   Social History:  The patient  reports that she has never smoked. She has never used smokeless tobacco. She reports that she does not drink alcohol or use drugs.   Family History:  The patient's family history includes Aneurysm in her father; Kidney failure in her mother; Stroke in her mother.    ROS:  Please see the history of present illness.   Otherwise, review of systems is positive for Shortness of breath, fatigue.   All other systems are reviewed and negative.    PHYSICAL EXAM: VS:  BP 135/63    Pulse 76   Ht 5' (1.524 m)   Wt 120 lb 6.4 oz (54.6 kg)   BMI 23.51 kg/m  , BMI Body mass index is 23.51 kg/m. GEN: Well nourished, well developed, in no acute distress  HEENT: normal  Neck: no JVD, carotid bruits, or masses Cardiac: RRR; no murmurs, rubs, or gallops,no edema  Respiratory:  clear to auscultation bilaterally, normal work of breathing GI: soft, nontender, nondistended, + BS MS: no deformity or atrophy  Skin: warm and dry,  device pocket is well healed Neuro:  Strength and sensation are intact Psych: euthymic mood, full affect  EKG:  EKG is ordered today. Personal review of the ekg ordered shows SR, V pacing   Device interrogation is reviewed today in detail.  See PaceArt for details.   Recent Labs: 05/21/2017: BUN 17; Creatinine, Ser 1.15; Potassium 5.2; Sodium 138    Lipid Panel  No results found for: CHOL, TRIG, HDL, CHOLHDL, VLDL, LDLCALC, LDLDIRECT   Wt Readings from Last 3 Encounters:  06/17/17 120 lb 6.4 oz (54.6 kg)  05/21/17 121 lb (54.9 kg)      Other studies Reviewed: Additional studies/ records that were reviewed today include: TTE 02/21/17 Review of the above records today demonstrates:  Mild concentric LVH, EF 52%, mild aortic regurgitation, mild left atrial dilation   ASSESSMENT AND PLAN:  1.  Permanent atrial fibrillation: Currently on Eliquis. Amiodarone previously attempted, though after many attempts at sinus rhythm, a rate control approach was deemed necessary. She is in atrial fibrillation today and is feeling well. We'll make no further changes.  This patients CHA2DS2-VASc Score and unadjusted Ischemic Stroke Rate (% per year) is equal to 4.8 % stroke rate/year from a score of 4  Above score calculated as 1 point each if present [CHF, HTN, DM, Vascular=MI/PAD/Aortic Plaque, Age if 65-74, or Female] Above score calculated as 2 points each if present [Age > 75, or Stroke/TIA/TE]  2. Hypertension:Currently well controlled. No changes  at this time.  3. Hyperlipidemia: Currently on pravastatin. No changes.  4. Status post AV nodal ablation: As therapy for atrial fibrillation. Medtronic pacemaker in place. We have change settings on her pacemaker to be VVIR. Her heart rate prior to this change was at 70 without any rate response.  Current medicines are reviewed at length with the patient today.   The patient does not have concerns regarding her medicines.  The following changes were made today:  none  Labs/ tests ordered today include:  Orders Placed This Encounter  Procedures  . EKG 12-Lead     Disposition:   FU with Bradey Luzier 1 year  Signed, Tzvi Economou Meredith Leeds, MD  06/17/2017 4:07 PM     [address]  [address] [place] [address] [phone] (office) [phone] (fax)

## 2017-06-17 NOTE — Patient Instructions (Signed)
Medication Instructions:  Your physician recommends that you continue on your current medications as directed. Please refer to the Current Medication list given to you today.  --- If you need a refill on your cardiac medications before your next appointment, please call your pharmacy. ---  Labwork: None ordered  Testing/Procedures: None ordered  Follow-Up: Remote monitoring is used to monitor your Pacemaker of ICD from home. This monitoring reduces the number of office visits required to check your device to one time per year. It allows [place] to keep an eye on the functioning of your device to ensure it is working properly. You are scheduled for a device check from home on 09/15/2017. You may send your transmission at any time that day. If you have a wireless device, the transmission will be sent automatically. After your physician reviews your transmission, you will receive a postcard with your next transmission date.  Your physician wants you to follow-up in: 1 year with Dr. Curt Bears.  You will receive a reminder letter in the mail two months in advance. If you don't receive a letter, please call our office to schedule the follow-up appointment.   Any Other Special Instructions Will Be Listed Below (If Applicable).    Thank you for choosing [facility]!!   Trinidad Curet, RN [phone]

## 2017-06-18 LAB — CUP PACEART INCLINIC DEVICE CHECK
Battery Remaining Longevity: 37 mo
Battery Voltage: 2.98 V
Brady Statistic AS VS Percent: 0.04 %
Brady Statistic RA Percent Paced: 0.68 %
Brady Statistic RV Percent Paced: 99.96 %
Date Time Interrogation Session: 20180823011100
Implantable Lead Implant Date: 20150916
Implantable Lead Implant Date: 20150916
Implantable Lead Location: 753860
Implantable Lead Model: 5076
Implantable Lead Model: 5076
Implantable Pulse Generator Implant Date: 20150916
Lead Channel Impedance Value: 266 Ohm
Lead Channel Impedance Value: 703 Ohm
Lead Channel Pacing Threshold Amplitude: 0.75 V
Lead Channel Pacing Threshold Amplitude: 0.75 V
Lead Channel Pacing Threshold Pulse Width: 0.4 ms
Lead Channel Sensing Intrinsic Amplitude: 2.375 mV
Lead Channel Setting Pacing Amplitude: 2 V
Lead Channel Setting Pacing Amplitude: 2.5 V
Lead Channel Setting Pacing Pulse Width: 0.4 ms
Lead Channel Setting Pacing Pulse Width: 0.4 ms
MDC IDC LEAD IMPLANT DT: 20150916
MDC IDC LEAD LOCATION: 753859
MDC IDC LEAD LOCATION: 753860
MDC IDC MSMT LEADCHNL LV IMPEDANCE VALUE: 323 Ohm
MDC IDC MSMT LEADCHNL LV IMPEDANCE VALUE: 627 Ohm
MDC IDC MSMT LEADCHNL LV IMPEDANCE VALUE: 684 Ohm
MDC IDC MSMT LEADCHNL LV PACING THRESHOLD PULSEWIDTH: 0.4 ms
MDC IDC MSMT LEADCHNL RA IMPEDANCE VALUE: 323 Ohm
MDC IDC MSMT LEADCHNL RA IMPEDANCE VALUE: 418 Ohm
MDC IDC MSMT LEADCHNL RV IMPEDANCE VALUE: 418 Ohm
MDC IDC MSMT LEADCHNL RV IMPEDANCE VALUE: 475 Ohm
MDC IDC SET LEADCHNL RV SENSING SENSITIVITY: 4 mV
MDC IDC STAT BRADY AP VP PERCENT: 0.99 %
MDC IDC STAT BRADY AP VS PERCENT: 0 %
MDC IDC STAT BRADY AS VP PERCENT: 98.97 %

## 2017-06-23 DIAGNOSIS — J309 Allergic rhinitis, unspecified: Secondary | ICD-10-CM | POA: Diagnosis not present

## 2017-06-23 DIAGNOSIS — J449 Chronic obstructive pulmonary disease, unspecified: Secondary | ICD-10-CM | POA: Diagnosis not present

## 2017-06-23 DIAGNOSIS — R0602 Shortness of breath: Secondary | ICD-10-CM | POA: Diagnosis not present

## 2017-06-23 DIAGNOSIS — Z7951 Long term (current) use of inhaled steroids: Secondary | ICD-10-CM | POA: Diagnosis not present

## 2017-07-09 DIAGNOSIS — H524 Presbyopia: Secondary | ICD-10-CM | POA: Diagnosis not present

## 2017-07-09 DIAGNOSIS — H26493 Other secondary cataract, bilateral: Secondary | ICD-10-CM | POA: Diagnosis not present

## 2017-08-10 DIAGNOSIS — N3281 Overactive bladder: Secondary | ICD-10-CM | POA: Diagnosis not present

## 2017-08-13 DIAGNOSIS — G2581 Restless legs syndrome: Secondary | ICD-10-CM | POA: Diagnosis not present

## 2017-08-13 DIAGNOSIS — M810 Age-related osteoporosis without current pathological fracture: Secondary | ICD-10-CM | POA: Diagnosis not present

## 2017-08-13 DIAGNOSIS — E782 Mixed hyperlipidemia: Secondary | ICD-10-CM | POA: Diagnosis not present

## 2017-08-13 DIAGNOSIS — E039 Hypothyroidism, unspecified: Secondary | ICD-10-CM | POA: Diagnosis not present

## 2017-08-13 DIAGNOSIS — I4891 Unspecified atrial fibrillation: Secondary | ICD-10-CM | POA: Diagnosis not present

## 2017-08-13 DIAGNOSIS — Z79899 Other long term (current) drug therapy: Secondary | ICD-10-CM | POA: Diagnosis not present

## 2017-08-13 DIAGNOSIS — Z6823 Body mass index (BMI) 23.0-23.9, adult: Secondary | ICD-10-CM | POA: Diagnosis not present

## 2017-08-13 DIAGNOSIS — E559 Vitamin D deficiency, unspecified: Secondary | ICD-10-CM | POA: Diagnosis not present

## 2017-08-13 DIAGNOSIS — J449 Chronic obstructive pulmonary disease, unspecified: Secondary | ICD-10-CM | POA: Diagnosis not present

## 2017-08-13 DIAGNOSIS — Z23 Encounter for immunization: Secondary | ICD-10-CM | POA: Diagnosis not present

## 2017-08-26 ENCOUNTER — Encounter: Payer: Self-pay | Admitting: Cardiology

## 2017-08-26 ENCOUNTER — Ambulatory Visit (INDEPENDENT_AMBULATORY_CARE_PROVIDER_SITE_OTHER): Payer: Medicare HMO | Admitting: Cardiology

## 2017-08-26 VITALS — BP 110/60 | HR 84 | Resp 16 | Ht 60.0 in | Wt 119.1 lb

## 2017-08-26 DIAGNOSIS — Z9889 Other specified postprocedural states: Secondary | ICD-10-CM | POA: Diagnosis not present

## 2017-08-26 DIAGNOSIS — I482 Chronic atrial fibrillation, unspecified: Secondary | ICD-10-CM

## 2017-08-26 DIAGNOSIS — Z950 Presence of cardiac pacemaker: Secondary | ICD-10-CM

## 2017-08-26 DIAGNOSIS — I10 Essential (primary) hypertension: Secondary | ICD-10-CM | POA: Diagnosis not present

## 2017-08-26 DIAGNOSIS — I63411 Cerebral infarction due to embolism of right middle cerebral artery: Secondary | ICD-10-CM

## 2017-08-26 DIAGNOSIS — E782 Mixed hyperlipidemia: Secondary | ICD-10-CM | POA: Diagnosis not present

## 2017-08-26 NOTE — Progress Notes (Signed)
Cardiology Office Note:    Date:  08/26/2017   ID:  Nichole Nelson, DOB [birth year], MRN [id number]  PCP:  Nicholos Johns, MD  Cardiologist:  Jenne Campus, MD    Referring MD: Nicholos Johns, MD   Chief Complaint  Patient presents with  . 2 month follow up  Doing well except for joint aches  History of Present Illness:    Nichole Nelson is a 82 y.o. female with chronic atrial fibrillation, pacemaker, AV ablation.  Doing well cardiac wise denies having a palpitations.  However biggest problem she has is pain in her joints in her hands.  She does have diagnosis of rheumatoid arthritis and I told her she may require to see rheumatologist.  Past Medical History:  Diagnosis Date  . COPD (chronic obstructive pulmonary disease) ([place])   . Hyperlipidemia   . Hypertension   . Thyroid disease     Past Surgical History:  Procedure Laterality Date  . ABDOMINAL HYSTERECTOMY    . AORTIC VALVE REPAIR    . APPENDECTOMY    . CORONARY ARTERY BYPASS GRAFT    . HERNIA REPAIR    . INSERT / REPLACE / REMOVE PACEMAKER     Medtronic    Current Medications: Current Meds  Medication Sig  . albuterol (VENTOLIN HFA) 108 (90 Base) MCG/ACT inhaler Inhale 2 puffs into the lungs as needed for wheezing.  Marland Kitchen amLODipine (NORVASC) 5 MG tablet Take 5 mg by mouth daily.  Marland Kitchen apixaban (ELIQUIS) 2.5 MG TABS tablet Take 2.5 mg by mouth 2 (two) times daily.  Marland Kitchen CARTIA XT 120 MG 24 hr capsule Take 1 tablet by mouth daily.  . Cholecalciferol (VITAMIN D3) 2000 units capsule Take 1 capsule by mouth daily.  . clonazePAM (KLONOPIN) 0.5 MG tablet Take 0.5 mg by mouth 2 (two) times daily.  Marland Kitchen dexlansoprazole (DEXILANT) 60 MG capsule Take 60 mg by mouth daily.  Marland Kitchen escitalopram (LEXAPRO) 10 MG tablet Take 10 mg by mouth daily.  . ferrous sulfate 325 (65 FE) MG tablet Take 325 mg by mouth daily.  . fluticasone furoate-vilanterol (BREO ELLIPTA) 100-25 MCG/INH AEPB Inhale 1 puff into the lungs daily.  Marland Kitchen gabapentin  (NEURONTIN) 100 MG capsule Take 100 mg by mouth 4 (four) times daily.  Marland Kitchen levothyroxine (SYNTHROID, LEVOTHROID) 88 MCG tablet Take 1 tablet by mouth daily.  . metoprolol tartrate (LOPRESSOR) 50 MG tablet Take 1 tablet by mouth 2 (two) times daily.  . mometasone (NASONEX) 50 MCG/ACT nasal spray Place 1 spray into both nostrils daily.  . montelukast (SINGULAIR) 10 MG tablet Take 10 mg by mouth daily.  . Omega-3 Fatty Acids (FISH OIL) 1000 MG CAPS Take 2,000 mg by mouth 2 (two) times daily.  . ondansetron (ZOFRAN) 4 MG tablet Take 4 mg by mouth every 8 (eight) hours as needed for nausea/vomiting.  . pravastatin (PRAVACHOL) 80 MG tablet Take 80 mg by mouth daily.  . traMADol (ULTRAM) 50 MG tablet Take 100 mg by mouth every 8 (eight) hours as needed for pain.     Allergies:   Iodinated diagnostic agents; Antihistamines, chlorpheniramine-type; and Meperidine   Social History   Social History  . Marital status: Widowed    Spouse name: N/A  . Number of children: N/A  . Years of education: N/A   Social History Main Topics  . Smoking status: Never Smoker  . Smokeless tobacco: Never Used  . Alcohol use No  . Drug use: No  . Sexual activity: Not Asked  Other Topics Concern  . None   Social History Narrative  . None     Family History: The patient's family history includes Aneurysm in her father; Kidney failure in her mother; Stroke in her mother. ROS:   Please see the history of present illness.    All 14 point review of systems negative except as described per history of present illness  EKGs/Labs/Other Studies Reviewed:      Recent Labs: 05/21/2017: BUN 17; Creatinine, Ser 1.15; Potassium 5.2; Sodium 138  Recent Lipid Panel No results found for: CHOL, TRIG, HDL, CHOLHDL, VLDL, LDLCALC, LDLDIRECT  Physical Exam:    VS:  BP 110/60   Pulse 84   Resp 16   Ht 5' (1.524 m)   Wt 119 lb 1.9 oz (54 kg)   BMI 23.26 kg/m     Wt Readings from Last 3 Encounters:  08/26/17 119 lb  1.9 oz (54 kg)  06/17/17 120 lb 6.4 oz (54.6 kg)  05/21/17 121 lb (54.9 kg)     GEN:  Well nourished, well developed in no acute distress HEENT: Normal NECK: No JVD; No carotid bruits LYMPHATICS: No lymphadenopathy CARDIAC: RRR, no murmurs, no rubs, no gallops RESPIRATORY:  Clear to auscultation without rales, wheezing or rhonchi  ABDOMEN: Soft, non-tender, non-distended MUSCULOSKELETAL:  No edema; No deformity  SKIN: Warm and dry LOWER EXTREMITIES: no swelling NEUROLOGIC:  Alert and oriented x 3 PSYCHIATRIC:  Normal affect   ASSESSMENT:    1. Chronic atrial fibrillation (HCC)   2. Embolic stroke involving right middle cerebral artery ([place])   3. Essential hypertension   4. S/P AV nodal ablation   5. Pacemaker   6. Mixed hyperlipidemia    PLAN:    In order of problems listed above:  1. Chronic atrial fibrillation: Rate controlled.  Anticoagulated which I will continue. 2. History of embolic stroke: No new problems stable. 3. Essential hypertension: Doing well from the point of view we will continue present management. 4. Status post AV nodal ablation followed by EP. 5. Pacemaker present recently interrogated normal function enrolled in our pacemaker clinic. 6. Dyslipidemia: We will call primary care physician to get fasting lipid profile   Medication Adjustments/Labs and Tests Ordered: Current medicines are reviewed at length with the patient today.  Concerns regarding medicines are outlined above.  No orders of the defined types were placed in this encounter.  Medication changes: No orders of the defined types were placed in this encounter.   Signed, Park Liter, MD, [facility] 08/26/2017 11:50 AM    [place]

## 2017-08-26 NOTE — Patient Instructions (Signed)
Medication Instructions:  Your physician recommends that you continue on your current medications as directed. Please refer to the Current Medication list given to you today.  1. Avoid all over-the-counter antihistamines except Claritin/Loratadine and Zyrtec/Cetrizine. 2. Avoid all combination including cold sinus allergies flu decongestant and sleep medications 3. You can use Robitussin DM Mucinex and Mucinex DM for cough. 4. can use Tylenol aspirin ibuprofen and naproxen but no combinations such as sleep or sinus.  Labwork: None   Testing/Procedures: None   Follow-Up: Your physician recommends that you schedule a follow-up appointment in: 5 months.   Any Other Special Instructions Will Be Listed Below (If Applicable).  Please note that any paperwork needing to be filled out by the provider will need to be addressed at the front desk prior to seeing the provider. Please note that any paperwork FMLA, Disability or other documents regarding health condition is subject to a $25.00 charge that must be received prior to completion of paperwork in the form of a money order or check.    If you need a refill on your cardiac medications before your next appointment, please call your pharmacy.

## 2017-08-27 ENCOUNTER — Telehealth: Payer: Self-pay | Admitting: *Deleted

## 2017-08-27 NOTE — Telephone Encounter (Addendum)
Price, Pryor Curia, RN  P Cv Div [facility] Device        Will forward to device clinic to address with patient.   Previous Messages    ----- Message -----  From: Kathyrn Sheriff, RN  Sent: 08/26/2017 11:39 AM  To: Stanton Kidney, RN   Roena Malady,   The above pt says she has lost her home transmission device to send home check on her PPM. I was not sure what to tell her?   Gregery Na :)      Called Ms. Mennen- she has not lost her monitor. She lost her remote appt reminder. I confirmed her remote transmission should be done 09/16/17. She verbalizes understanding and is appreciative.

## 2017-09-16 ENCOUNTER — Telehealth: Payer: Self-pay | Admitting: Cardiology

## 2017-09-16 ENCOUNTER — Ambulatory Visit (INDEPENDENT_AMBULATORY_CARE_PROVIDER_SITE_OTHER): Payer: Medicare HMO | Admitting: *Deleted

## 2017-09-16 DIAGNOSIS — I482 Chronic atrial fibrillation, unspecified: Secondary | ICD-10-CM

## 2017-09-16 NOTE — Telephone Encounter (Signed)
Spoke with pt and reminded pt of remote transmission that is due today. Pt verbalized understanding.   

## 2017-09-21 NOTE — Progress Notes (Signed)
Remote pacemaker transmission.   

## 2017-09-25 ENCOUNTER — Encounter: Payer: Self-pay | Admitting: Cardiology

## 2017-09-25 NOTE — Progress Notes (Signed)
Letter  

## 2017-10-05 LAB — CUP PACEART REMOTE DEVICE CHECK
Battery Remaining Longevity: 47 mo
Brady Statistic AP VS Percent: 0 %
Brady Statistic AS VS Percent: 0.06 %
Brady Statistic RV Percent Paced: 99.98 %
Date Time Interrogation Session: 20181122072442
Implantable Lead Implant Date: 20150916
Implantable Lead Implant Date: 20150916
Implantable Lead Location: 753859
Implantable Lead Location: 753860
Implantable Lead Location: 753860
Implantable Lead Model: 5076
Implantable Lead Model: 5076
Implantable Pulse Generator Implant Date: 20150916
Lead Channel Impedance Value: 399 Ohm
Lead Channel Impedance Value: 418 Ohm
Lead Channel Impedance Value: 456 Ohm
Lead Channel Impedance Value: 570 Ohm
Lead Channel Pacing Threshold Amplitude: 0.75 V
Lead Channel Pacing Threshold Amplitude: 0.75 V
Lead Channel Pacing Threshold Pulse Width: 0.4 ms
Lead Channel Sensing Intrinsic Amplitude: 1.375 mV
Lead Channel Sensing Intrinsic Amplitude: 14.625 mV
Lead Channel Sensing Intrinsic Amplitude: 3.375 mV
Lead Channel Setting Pacing Amplitude: 2 V
Lead Channel Setting Pacing Amplitude: 2.5 V
Lead Channel Setting Pacing Pulse Width: 0.4 ms
Lead Channel Setting Pacing Pulse Width: 0.4 ms
MDC IDC LEAD IMPLANT DT: 20150916
MDC IDC MSMT BATTERY VOLTAGE: 2.99 V
MDC IDC MSMT LEADCHNL LV IMPEDANCE VALUE: 266 Ohm
MDC IDC MSMT LEADCHNL LV IMPEDANCE VALUE: 323 Ohm
MDC IDC MSMT LEADCHNL LV IMPEDANCE VALUE: 646 Ohm
MDC IDC MSMT LEADCHNL LV IMPEDANCE VALUE: 665 Ohm
MDC IDC MSMT LEADCHNL RA IMPEDANCE VALUE: 323 Ohm
MDC IDC MSMT LEADCHNL RA PACING THRESHOLD AMPLITUDE: 0.625 V
MDC IDC MSMT LEADCHNL RA PACING THRESHOLD PULSEWIDTH: 0.4 ms
MDC IDC MSMT LEADCHNL RA SENSING INTR AMPL: 2.375 mV
MDC IDC MSMT LEADCHNL RV PACING THRESHOLD PULSEWIDTH: 0.4 ms
MDC IDC SET LEADCHNL RV SENSING SENSITIVITY: 4 mV
MDC IDC STAT BRADY AP VP PERCENT: 0 %
MDC IDC STAT BRADY AS VP PERCENT: 99.94 %
MDC IDC STAT BRADY RA PERCENT PACED: 0 %

## 2017-10-07 ENCOUNTER — Telehealth: Payer: Self-pay | Admitting: *Deleted

## 2017-10-07 NOTE — Telephone Encounter (Signed)
Optivol abnormal sine July. OV with Dr. Curt Bears in August- no intervention. No history of HF or cardiomyopathy.  Nichole Nelson reports that she has not had any shortness of breath.

## 2017-10-07 NOTE — Telephone Encounter (Signed)
-----   Message from [name], MD sent at 10/06/2017  4:01 PM EST ----- Abnormal device interrogation reviewed.  Lead parameters and battery status stable.  If SOB, lasix 20 mg daily for 5 days.

## 2017-10-23 DIAGNOSIS — N3281 Overactive bladder: Secondary | ICD-10-CM | POA: Diagnosis not present

## 2017-10-26 DIAGNOSIS — I10 Essential (primary) hypertension: Secondary | ICD-10-CM | POA: Diagnosis not present

## 2017-10-26 DIAGNOSIS — R0602 Shortness of breath: Secondary | ICD-10-CM | POA: Diagnosis not present

## 2017-10-26 DIAGNOSIS — I2510 Atherosclerotic heart disease of native coronary artery without angina pectoris: Secondary | ICD-10-CM | POA: Diagnosis not present

## 2017-10-26 DIAGNOSIS — J449 Chronic obstructive pulmonary disease, unspecified: Secondary | ICD-10-CM | POA: Diagnosis not present

## 2017-11-20 DIAGNOSIS — N3281 Overactive bladder: Secondary | ICD-10-CM | POA: Diagnosis not present

## 2017-11-25 DIAGNOSIS — Z6822 Body mass index (BMI) 22.0-22.9, adult: Secondary | ICD-10-CM | POA: Diagnosis not present

## 2017-11-25 DIAGNOSIS — E039 Hypothyroidism, unspecified: Secondary | ICD-10-CM | POA: Diagnosis not present

## 2017-11-25 DIAGNOSIS — I4891 Unspecified atrial fibrillation: Secondary | ICD-10-CM | POA: Diagnosis not present

## 2017-11-25 DIAGNOSIS — J449 Chronic obstructive pulmonary disease, unspecified: Secondary | ICD-10-CM | POA: Diagnosis not present

## 2017-11-25 DIAGNOSIS — M19011 Primary osteoarthritis, right shoulder: Secondary | ICD-10-CM | POA: Diagnosis not present

## 2017-11-25 DIAGNOSIS — R270 Ataxia, unspecified: Secondary | ICD-10-CM | POA: Diagnosis not present

## 2017-11-25 DIAGNOSIS — Z79899 Other long term (current) drug therapy: Secondary | ICD-10-CM | POA: Diagnosis not present

## 2017-11-25 DIAGNOSIS — G2581 Restless legs syndrome: Secondary | ICD-10-CM | POA: Diagnosis not present

## 2017-11-25 DIAGNOSIS — E782 Mixed hyperlipidemia: Secondary | ICD-10-CM | POA: Diagnosis not present

## 2017-12-16 ENCOUNTER — Telehealth: Payer: Self-pay | Admitting: Cardiology

## 2017-12-16 ENCOUNTER — Ambulatory Visit (INDEPENDENT_AMBULATORY_CARE_PROVIDER_SITE_OTHER): Payer: Medicare HMO | Admitting: *Deleted

## 2017-12-16 DIAGNOSIS — I482 Chronic atrial fibrillation, unspecified: Secondary | ICD-10-CM

## 2017-12-16 NOTE — Telephone Encounter (Signed)
Spoke with pt and reminded pt of remote transmission that is due today. Pt verbalized understanding.   

## 2017-12-17 ENCOUNTER — Encounter: Payer: Self-pay | Admitting: Cardiology

## 2017-12-17 NOTE — Progress Notes (Signed)
Remote pacemaker transmission.   

## 2017-12-18 DIAGNOSIS — N3281 Overactive bladder: Secondary | ICD-10-CM | POA: Diagnosis not present

## 2017-12-23 LAB — CUP PACEART REMOTE DEVICE CHECK
Brady Statistic AP VS Percent: 0 %
Brady Statistic RA Percent Paced: 0 %
Brady Statistic RV Percent Paced: 99.98 %
Implantable Lead Implant Date: 20150916
Implantable Lead Location: 753859
Implantable Lead Model: 5076
Implantable Lead Model: 5076
Implantable Lead Model: 5076
Implantable Pulse Generator Implant Date: 20150916
Lead Channel Impedance Value: 266 Ohm
Lead Channel Impedance Value: 437 Ohm
Lead Channel Impedance Value: 532 Ohm
Lead Channel Impedance Value: 589 Ohm
Lead Channel Pacing Threshold Pulse Width: 0.4 ms
Lead Channel Sensing Intrinsic Amplitude: 1.375 mV
Lead Channel Sensing Intrinsic Amplitude: 2.375 mV
Lead Channel Sensing Intrinsic Amplitude: 21.75 mV
Lead Channel Setting Pacing Amplitude: 2.5 V
Lead Channel Setting Pacing Pulse Width: 0.4 ms
Lead Channel Setting Sensing Sensitivity: 4 mV
MDC IDC LEAD IMPLANT DT: 20150916
MDC IDC LEAD IMPLANT DT: 20150916
MDC IDC LEAD LOCATION: 753860
MDC IDC LEAD LOCATION: 753860
MDC IDC MSMT BATTERY REMAINING LONGEVITY: 44 mo
MDC IDC MSMT BATTERY VOLTAGE: 2.98 V
MDC IDC MSMT LEADCHNL LV IMPEDANCE VALUE: 323 Ohm
MDC IDC MSMT LEADCHNL LV IMPEDANCE VALUE: 627 Ohm
MDC IDC MSMT LEADCHNL LV PACING THRESHOLD AMPLITUDE: 0.625 V
MDC IDC MSMT LEADCHNL LV PACING THRESHOLD PULSEWIDTH: 0.4 ms
MDC IDC MSMT LEADCHNL RA IMPEDANCE VALUE: 323 Ohm
MDC IDC MSMT LEADCHNL RA IMPEDANCE VALUE: 418 Ohm
MDC IDC MSMT LEADCHNL RA PACING THRESHOLD AMPLITUDE: 0.625 V
MDC IDC MSMT LEADCHNL RV IMPEDANCE VALUE: 399 Ohm
MDC IDC MSMT LEADCHNL RV PACING THRESHOLD AMPLITUDE: 0.75 V
MDC IDC MSMT LEADCHNL RV PACING THRESHOLD PULSEWIDTH: 0.4 ms
MDC IDC MSMT LEADCHNL RV SENSING INTR AMPL: 21.75 mV
MDC IDC SESS DTM: 20190221020211
MDC IDC SET LEADCHNL LV PACING AMPLITUDE: 2 V
MDC IDC SET LEADCHNL LV PACING PULSEWIDTH: 0.4 ms
MDC IDC STAT BRADY AP VP PERCENT: 0 %
MDC IDC STAT BRADY AS VP PERCENT: 100 %
MDC IDC STAT BRADY AS VS PERCENT: 0 %

## 2018-01-15 DIAGNOSIS — N3281 Overactive bladder: Secondary | ICD-10-CM | POA: Diagnosis not present

## 2018-01-19 ENCOUNTER — Telehealth: Payer: Self-pay | Admitting: Cardiology

## 2018-01-19 MED ORDER — METOPROLOL TARTRATE 50 MG PO TABS: 50.0000 mg | ORAL_TABLET | Freq: Two times a day (BID) | ORAL | 2 refills | Status: DC

## 2018-01-19 NOTE — Telephone Encounter (Signed)
Patient is asking for refill of her metoprolol sent to [facility] on [address] in [place] please

## 2018-01-19 NOTE — Telephone Encounter (Signed)
Refill sent.

## 2018-01-22 ENCOUNTER — Encounter: Payer: Self-pay | Admitting: Cardiology

## 2018-01-22 ENCOUNTER — Ambulatory Visit (INDEPENDENT_AMBULATORY_CARE_PROVIDER_SITE_OTHER): Payer: Medicare HMO | Admitting: Cardiology

## 2018-01-22 VITALS — BP 114/68 | HR 71 | Ht 60.0 in | Wt 117.2 lb

## 2018-01-22 DIAGNOSIS — Z950 Presence of cardiac pacemaker: Secondary | ICD-10-CM | POA: Diagnosis not present

## 2018-01-22 DIAGNOSIS — Z9889 Other specified postprocedural states: Secondary | ICD-10-CM

## 2018-01-22 DIAGNOSIS — I482 Chronic atrial fibrillation, unspecified: Secondary | ICD-10-CM

## 2018-01-22 DIAGNOSIS — I6522 Occlusion and stenosis of left carotid artery: Secondary | ICD-10-CM

## 2018-01-22 NOTE — Patient Instructions (Signed)

## 2018-01-22 NOTE — Progress Notes (Signed)
Cardiology Office Note:    Date:  01/22/2018   ID:  Kandice Moos, DOB 1935/06/24, MRN [id number]  PCP:  Nicholos Johns, MD  Cardiologist:  Jenne Campus, MD    Referring MD: Nicholos Johns, MD   Chief Complaint  Patient presents with  . Follow-up  Doing well  History of Present Illness:    Debie Ashline is a 83 y.o. female with chronic atrial fibrillation, anticoagulated, history of CVA, pacemaker, dyslipidemia overall doing well.  Walk with a walker.  Denies have any chest pain tightness squeezing pressure burning chest no palpitations.  Past Medical History:  Diagnosis Date  . COPD (chronic obstructive pulmonary disease) ([place])   . Hyperlipidemia   . Hypertension   . Thyroid disease       Current Medications: Current Meds  Medication Sig  . apixaban (ELIQUIS) 2.5 MG TABS tablet Take 2.5 mg by mouth 2 (two) times daily.  Marland Kitchen CARTIA XT 120 MG 24 hr capsule Take 1 tablet by mouth daily.  . Cholecalciferol (VITAMIN D3) 2000 units capsule Take 1 capsule by mouth daily.  . clonazePAM (KLONOPIN) 0.5 MG tablet Take 0.5 mg by mouth 2 (two) times daily.  Marland Kitchen dexlansoprazole (DEXILANT) 60 MG capsule Take 60 mg by mouth daily.  Marland Kitchen escitalopram (LEXAPRO) 10 MG tablet Take 10 mg by mouth daily.  . ferrous sulfate 325 (65 FE) MG tablet Take 325 mg by mouth daily.  . fluticasone furoate-vilanterol (BREO ELLIPTA) 100-25 MCG/INH AEPB Inhale 1 puff into the lungs daily.  Marland Kitchen levothyroxine (SYNTHROID, LEVOTHROID) 88 MCG tablet Take 1 tablet by mouth daily.  . metoprolol tartrate (LOPRESSOR) 50 MG tablet Take 1 tablet (50 mg total) by mouth 2 (two) times daily.  . mometasone (NASONEX) 50 MCG/ACT nasal spray Place 1 spray into both nostrils daily.  . montelukast (SINGULAIR) 10 MG tablet Take 10 mg by mouth daily.  . Omega-3 Fatty Acids (FISH OIL) 1000 MG CAPS Take 2,000 mg by mouth 2 (two) times daily.  . ondansetron (ZOFRAN) 4 MG tablet Take 4 mg by mouth every 8 (eight) hours as  needed for nausea/vomiting.  . pravastatin (PRAVACHOL) 80 MG tablet Take 80 mg by mouth daily.  . traMADol (ULTRAM) 50 MG tablet Take 100 mg by mouth every 8 (eight) hours as needed for pain.     Allergies:   Iodinated diagnostic agents; Antihistamines, chlorpheniramine-type; and Meperidine   Social History   Socioeconomic History  . Marital status: Widowed    Spouse name: Not on file  . Number of children: Not on file  . Years of education: Not on file  . Highest education level: Not on file  Occupational History  . Not on file  Social Needs  . Financial resource strain: Not on file  . Food insecurity:    Worry: Not on file    Inability: Not on file  . Transportation needs:    Medical: Not on file    Non-medical: Not on file  Tobacco Use  . Smoking status: Never Smoker  . Smokeless tobacco: Never Used  Substance and Sexual Activity  . Alcohol use: No  . Drug use: No  . Sexual activity: Not on file  Lifestyle  . Physical activity:    Days per week: Not on file    Minutes per session: Not on file  . Stress: Not on file  Relationships  . Social connections:    Talks on phone: Not on file    Gets together: Not on file  Attends religious service: Not on file    Active member of club or organization: Not on file    Attends meetings of clubs or organizations: Not on file    Relationship status: Not on file  Other Topics Concern  . Not on file  Social History Narrative  . Not on file     Family History: The patient's family history includes Aneurysm in her father; Kidney failure in her mother; Stroke in her mother. ROS:   Please see the history of present illness.    All 14 point review of systems negative except as described per history of present illness  EKGs/Labs/Other Studies Reviewed:      Recent Labs: 05/21/2017: BUN 17; Creatinine, Ser 1.15; Potassium 5.2; Sodium 138  Recent Lipid Panel No results found for: CHOL, TRIG, HDL, CHOLHDL, VLDL, LDLCALC,  LDLDIRECT  Physical Exam:    VS:  BP 114/68   Pulse 71   Ht 5' (1.524 m)   Wt 117 lb 3.2 oz (53.2 kg)   SpO2 99%   BMI 22.89 kg/m     Wt Readings from Last 3 Encounters:  01/22/18 117 lb 3.2 oz (53.2 kg)  08/26/17 119 lb 1.9 oz (54 kg)  06/17/17 120 lb 6.4 oz (54.6 kg)     GEN:  Well nourished, well developed in no acute distress HEENT: Normal NECK: No JVD; No carotid bruits LYMPHATICS: No lymphadenopathy CARDIAC: RRR, no murmurs, no rubs, no gallops RESPIRATORY:  Clear to auscultation without rales, wheezing or rhonchi  ABDOMEN: Soft, non-tender, non-distended MUSCULOSKELETAL:  No edema; No deformity  SKIN: Warm and dry LOWER EXTREMITIES: no swelling NEUROLOGIC:  Alert and oriented x 3 PSYCHIATRIC:  Normal affect   ASSESSMENT:    1. Chronic atrial fibrillation (HCC)   2. Stenosis of left carotid artery   3. S/P AV nodal ablation   4. Pacemaker    PLAN:    In order of problems listed above:  1. Chronic atrial fibrillation: Rate control anticoagulated which I will continue. 2. Stenosis of the left carotid artery on the mild based on last carotid ultrasound which was done in December of last year.  We will repeat the test later this year. 3. Status post AV nodal ablation doing well 4. Pacemaker: I reviewed her last interrogation normal function.  Her back in 6 months or sooner if she has a problem   Medication Adjustments/Labs and Tests Ordered: Current medicines are reviewed at length with the patient today.  Concerns regarding medicines are outlined above.  No orders of the defined types were placed in this encounter.  Medication changes: No orders of the defined types were placed in this encounter.   Signed, Park Liter, MD, [facility] 01/22/2018 2:50 PM    [facility]

## 2018-01-25 DIAGNOSIS — J449 Chronic obstructive pulmonary disease, unspecified: Secondary | ICD-10-CM | POA: Diagnosis not present

## 2018-01-25 DIAGNOSIS — R0602 Shortness of breath: Secondary | ICD-10-CM | POA: Diagnosis not present

## 2018-02-16 DIAGNOSIS — N3281 Overactive bladder: Secondary | ICD-10-CM | POA: Diagnosis not present

## 2018-02-23 DIAGNOSIS — M19019 Primary osteoarthritis, unspecified shoulder: Secondary | ICD-10-CM | POA: Diagnosis not present

## 2018-02-23 DIAGNOSIS — E782 Mixed hyperlipidemia: Secondary | ICD-10-CM | POA: Diagnosis not present

## 2018-02-23 DIAGNOSIS — M545 Low back pain: Secondary | ICD-10-CM | POA: Diagnosis not present

## 2018-02-23 DIAGNOSIS — Z6822 Body mass index (BMI) 22.0-22.9, adult: Secondary | ICD-10-CM | POA: Diagnosis not present

## 2018-02-23 DIAGNOSIS — I10 Essential (primary) hypertension: Secondary | ICD-10-CM | POA: Diagnosis not present

## 2018-02-23 DIAGNOSIS — Z139 Encounter for screening, unspecified: Secondary | ICD-10-CM | POA: Diagnosis not present

## 2018-02-23 DIAGNOSIS — E039 Hypothyroidism, unspecified: Secondary | ICD-10-CM | POA: Diagnosis not present

## 2018-02-23 DIAGNOSIS — J449 Chronic obstructive pulmonary disease, unspecified: Secondary | ICD-10-CM | POA: Diagnosis not present

## 2018-02-23 DIAGNOSIS — I4891 Unspecified atrial fibrillation: Secondary | ICD-10-CM | POA: Diagnosis not present

## 2018-02-23 DIAGNOSIS — Z79899 Other long term (current) drug therapy: Secondary | ICD-10-CM | POA: Diagnosis not present

## 2018-03-01 DIAGNOSIS — F4542 Pain disorder with related psychological factors: Secondary | ICD-10-CM | POA: Diagnosis not present

## 2018-03-01 DIAGNOSIS — M25811 Other specified joint disorders, right shoulder: Secondary | ICD-10-CM | POA: Diagnosis not present

## 2018-03-01 DIAGNOSIS — M25511 Pain in right shoulder: Secondary | ICD-10-CM | POA: Diagnosis not present

## 2018-03-01 DIAGNOSIS — M19019 Primary osteoarthritis, unspecified shoulder: Secondary | ICD-10-CM | POA: Diagnosis not present

## 2018-03-01 DIAGNOSIS — M25512 Pain in left shoulder: Secondary | ICD-10-CM | POA: Diagnosis not present

## 2018-03-01 DIAGNOSIS — I2510 Atherosclerotic heart disease of native coronary artery without angina pectoris: Secondary | ICD-10-CM | POA: Diagnosis not present

## 2018-03-01 DIAGNOSIS — J449 Chronic obstructive pulmonary disease, unspecified: Secondary | ICD-10-CM | POA: Diagnosis not present

## 2018-03-01 DIAGNOSIS — I4891 Unspecified atrial fibrillation: Secondary | ICD-10-CM | POA: Diagnosis not present

## 2018-03-01 DIAGNOSIS — I10 Essential (primary) hypertension: Secondary | ICD-10-CM | POA: Diagnosis not present

## 2018-03-04 DIAGNOSIS — I4891 Unspecified atrial fibrillation: Secondary | ICD-10-CM | POA: Diagnosis not present

## 2018-03-04 DIAGNOSIS — J449 Chronic obstructive pulmonary disease, unspecified: Secondary | ICD-10-CM | POA: Diagnosis not present

## 2018-03-04 DIAGNOSIS — I10 Essential (primary) hypertension: Secondary | ICD-10-CM | POA: Diagnosis not present

## 2018-03-04 DIAGNOSIS — M25512 Pain in left shoulder: Secondary | ICD-10-CM | POA: Diagnosis not present

## 2018-03-04 DIAGNOSIS — F4542 Pain disorder with related psychological factors: Secondary | ICD-10-CM | POA: Diagnosis not present

## 2018-03-04 DIAGNOSIS — M25511 Pain in right shoulder: Secondary | ICD-10-CM | POA: Diagnosis not present

## 2018-03-04 DIAGNOSIS — I2510 Atherosclerotic heart disease of native coronary artery without angina pectoris: Secondary | ICD-10-CM | POA: Diagnosis not present

## 2018-03-04 DIAGNOSIS — M19019 Primary osteoarthritis, unspecified shoulder: Secondary | ICD-10-CM | POA: Diagnosis not present

## 2018-03-04 DIAGNOSIS — M25811 Other specified joint disorders, right shoulder: Secondary | ICD-10-CM | POA: Diagnosis not present

## 2018-03-08 DIAGNOSIS — I4891 Unspecified atrial fibrillation: Secondary | ICD-10-CM | POA: Diagnosis not present

## 2018-03-08 DIAGNOSIS — M25512 Pain in left shoulder: Secondary | ICD-10-CM | POA: Diagnosis not present

## 2018-03-08 DIAGNOSIS — M19019 Primary osteoarthritis, unspecified shoulder: Secondary | ICD-10-CM | POA: Diagnosis not present

## 2018-03-08 DIAGNOSIS — M25511 Pain in right shoulder: Secondary | ICD-10-CM | POA: Diagnosis not present

## 2018-03-08 DIAGNOSIS — J449 Chronic obstructive pulmonary disease, unspecified: Secondary | ICD-10-CM | POA: Diagnosis not present

## 2018-03-08 DIAGNOSIS — F4542 Pain disorder with related psychological factors: Secondary | ICD-10-CM | POA: Diagnosis not present

## 2018-03-08 DIAGNOSIS — I10 Essential (primary) hypertension: Secondary | ICD-10-CM | POA: Diagnosis not present

## 2018-03-08 DIAGNOSIS — M25811 Other specified joint disorders, right shoulder: Secondary | ICD-10-CM | POA: Diagnosis not present

## 2018-03-08 DIAGNOSIS — I2510 Atherosclerotic heart disease of native coronary artery without angina pectoris: Secondary | ICD-10-CM | POA: Diagnosis not present

## 2018-03-11 DIAGNOSIS — I10 Essential (primary) hypertension: Secondary | ICD-10-CM | POA: Diagnosis not present

## 2018-03-11 DIAGNOSIS — I4891 Unspecified atrial fibrillation: Secondary | ICD-10-CM | POA: Diagnosis not present

## 2018-03-11 DIAGNOSIS — M25811 Other specified joint disorders, right shoulder: Secondary | ICD-10-CM | POA: Diagnosis not present

## 2018-03-11 DIAGNOSIS — I2510 Atherosclerotic heart disease of native coronary artery without angina pectoris: Secondary | ICD-10-CM | POA: Diagnosis not present

## 2018-03-11 DIAGNOSIS — J449 Chronic obstructive pulmonary disease, unspecified: Secondary | ICD-10-CM | POA: Diagnosis not present

## 2018-03-11 DIAGNOSIS — M19019 Primary osteoarthritis, unspecified shoulder: Secondary | ICD-10-CM | POA: Diagnosis not present

## 2018-03-11 DIAGNOSIS — F4542 Pain disorder with related psychological factors: Secondary | ICD-10-CM | POA: Diagnosis not present

## 2018-03-11 DIAGNOSIS — M25511 Pain in right shoulder: Secondary | ICD-10-CM | POA: Diagnosis not present

## 2018-03-11 DIAGNOSIS — M25512 Pain in left shoulder: Secondary | ICD-10-CM | POA: Diagnosis not present

## 2018-03-15 DIAGNOSIS — M25512 Pain in left shoulder: Secondary | ICD-10-CM | POA: Diagnosis not present

## 2018-03-15 DIAGNOSIS — I2510 Atherosclerotic heart disease of native coronary artery without angina pectoris: Secondary | ICD-10-CM | POA: Diagnosis not present

## 2018-03-15 DIAGNOSIS — J449 Chronic obstructive pulmonary disease, unspecified: Secondary | ICD-10-CM | POA: Diagnosis not present

## 2018-03-15 DIAGNOSIS — I10 Essential (primary) hypertension: Secondary | ICD-10-CM | POA: Diagnosis not present

## 2018-03-15 DIAGNOSIS — I4891 Unspecified atrial fibrillation: Secondary | ICD-10-CM | POA: Diagnosis not present

## 2018-03-15 DIAGNOSIS — M25811 Other specified joint disorders, right shoulder: Secondary | ICD-10-CM | POA: Diagnosis not present

## 2018-03-15 DIAGNOSIS — M25511 Pain in right shoulder: Secondary | ICD-10-CM | POA: Diagnosis not present

## 2018-03-15 DIAGNOSIS — M19019 Primary osteoarthritis, unspecified shoulder: Secondary | ICD-10-CM | POA: Diagnosis not present

## 2018-03-15 DIAGNOSIS — F4542 Pain disorder with related psychological factors: Secondary | ICD-10-CM | POA: Diagnosis not present

## 2018-03-17 ENCOUNTER — Ambulatory Visit (INDEPENDENT_AMBULATORY_CARE_PROVIDER_SITE_OTHER): Payer: Medicare HMO | Admitting: *Deleted

## 2018-03-17 DIAGNOSIS — I482 Chronic atrial fibrillation, unspecified: Secondary | ICD-10-CM

## 2018-03-17 DIAGNOSIS — I10 Essential (primary) hypertension: Secondary | ICD-10-CM

## 2018-03-17 DIAGNOSIS — N3281 Overactive bladder: Secondary | ICD-10-CM | POA: Diagnosis not present

## 2018-03-17 NOTE — Progress Notes (Signed)
Remote pacemaker transmission.   

## 2018-03-18 DIAGNOSIS — M19019 Primary osteoarthritis, unspecified shoulder: Secondary | ICD-10-CM | POA: Diagnosis not present

## 2018-03-18 DIAGNOSIS — I4891 Unspecified atrial fibrillation: Secondary | ICD-10-CM | POA: Diagnosis not present

## 2018-03-18 DIAGNOSIS — J449 Chronic obstructive pulmonary disease, unspecified: Secondary | ICD-10-CM | POA: Diagnosis not present

## 2018-03-18 DIAGNOSIS — M25511 Pain in right shoulder: Secondary | ICD-10-CM | POA: Diagnosis not present

## 2018-03-18 DIAGNOSIS — M25512 Pain in left shoulder: Secondary | ICD-10-CM | POA: Diagnosis not present

## 2018-03-18 DIAGNOSIS — F4542 Pain disorder with related psychological factors: Secondary | ICD-10-CM | POA: Diagnosis not present

## 2018-03-18 DIAGNOSIS — M25811 Other specified joint disorders, right shoulder: Secondary | ICD-10-CM | POA: Diagnosis not present

## 2018-03-18 DIAGNOSIS — I10 Essential (primary) hypertension: Secondary | ICD-10-CM | POA: Diagnosis not present

## 2018-03-18 DIAGNOSIS — I2510 Atherosclerotic heart disease of native coronary artery without angina pectoris: Secondary | ICD-10-CM | POA: Diagnosis not present

## 2018-03-23 DIAGNOSIS — I10 Essential (primary) hypertension: Secondary | ICD-10-CM | POA: Diagnosis not present

## 2018-03-23 DIAGNOSIS — M19019 Primary osteoarthritis, unspecified shoulder: Secondary | ICD-10-CM | POA: Diagnosis not present

## 2018-03-23 DIAGNOSIS — F4542 Pain disorder with related psychological factors: Secondary | ICD-10-CM | POA: Diagnosis not present

## 2018-03-23 DIAGNOSIS — M25511 Pain in right shoulder: Secondary | ICD-10-CM | POA: Diagnosis not present

## 2018-03-23 DIAGNOSIS — M25811 Other specified joint disorders, right shoulder: Secondary | ICD-10-CM | POA: Diagnosis not present

## 2018-03-23 DIAGNOSIS — I4891 Unspecified atrial fibrillation: Secondary | ICD-10-CM | POA: Diagnosis not present

## 2018-03-23 DIAGNOSIS — I2510 Atherosclerotic heart disease of native coronary artery without angina pectoris: Secondary | ICD-10-CM | POA: Diagnosis not present

## 2018-03-23 DIAGNOSIS — M25512 Pain in left shoulder: Secondary | ICD-10-CM | POA: Diagnosis not present

## 2018-03-23 DIAGNOSIS — J449 Chronic obstructive pulmonary disease, unspecified: Secondary | ICD-10-CM | POA: Diagnosis not present

## 2018-03-25 DIAGNOSIS — M25512 Pain in left shoulder: Secondary | ICD-10-CM | POA: Diagnosis not present

## 2018-03-25 DIAGNOSIS — I2510 Atherosclerotic heart disease of native coronary artery without angina pectoris: Secondary | ICD-10-CM | POA: Diagnosis not present

## 2018-03-25 DIAGNOSIS — I10 Essential (primary) hypertension: Secondary | ICD-10-CM | POA: Diagnosis not present

## 2018-03-25 DIAGNOSIS — I4891 Unspecified atrial fibrillation: Secondary | ICD-10-CM | POA: Diagnosis not present

## 2018-03-25 DIAGNOSIS — M25811 Other specified joint disorders, right shoulder: Secondary | ICD-10-CM | POA: Diagnosis not present

## 2018-03-25 DIAGNOSIS — J449 Chronic obstructive pulmonary disease, unspecified: Secondary | ICD-10-CM | POA: Diagnosis not present

## 2018-03-25 DIAGNOSIS — F4542 Pain disorder with related psychological factors: Secondary | ICD-10-CM | POA: Diagnosis not present

## 2018-03-25 DIAGNOSIS — M19019 Primary osteoarthritis, unspecified shoulder: Secondary | ICD-10-CM | POA: Diagnosis not present

## 2018-03-25 DIAGNOSIS — M25511 Pain in right shoulder: Secondary | ICD-10-CM | POA: Diagnosis not present

## 2018-04-03 LAB — CUP PACEART REMOTE DEVICE CHECK
Battery Voltage: 2.98 V
Brady Statistic RA Percent Paced: 0 %
Implantable Lead Implant Date: 20150916
Implantable Lead Implant Date: 20150916
Implantable Lead Location: 753860
Implantable Lead Model: 5076
Implantable Pulse Generator Implant Date: 20150916
Lead Channel Impedance Value: 323 Ohm
Lead Channel Impedance Value: 741 Ohm
Lead Channel Pacing Threshold Amplitude: 0.875 V
Lead Channel Pacing Threshold Pulse Width: 0.4 ms
Lead Channel Pacing Threshold Pulse Width: 0.4 ms
Lead Channel Sensing Intrinsic Amplitude: 11.125 mV
Lead Channel Sensing Intrinsic Amplitude: 11.125 mV
Lead Channel Sensing Intrinsic Amplitude: 2.375 mV
Lead Channel Setting Pacing Amplitude: 2 V
Lead Channel Setting Pacing Pulse Width: 0.4 ms
MDC IDC LEAD IMPLANT DT: 20150916
MDC IDC LEAD LOCATION: 753859
MDC IDC LEAD LOCATION: 753860
MDC IDC MSMT BATTERY REMAINING LONGEVITY: 42 mo
MDC IDC MSMT LEADCHNL LV IMPEDANCE VALUE: 266 Ohm
MDC IDC MSMT LEADCHNL LV IMPEDANCE VALUE: 323 Ohm
MDC IDC MSMT LEADCHNL LV IMPEDANCE VALUE: 684 Ohm
MDC IDC MSMT LEADCHNL LV IMPEDANCE VALUE: 684 Ohm
MDC IDC MSMT LEADCHNL LV PACING THRESHOLD AMPLITUDE: 0.75 V
MDC IDC MSMT LEADCHNL RA IMPEDANCE VALUE: 418 Ohm
MDC IDC MSMT LEADCHNL RA PACING THRESHOLD AMPLITUDE: 0.625 V
MDC IDC MSMT LEADCHNL RA PACING THRESHOLD PULSEWIDTH: 0.4 ms
MDC IDC MSMT LEADCHNL RA SENSING INTR AMPL: 1.375 mV
MDC IDC MSMT LEADCHNL RV IMPEDANCE VALUE: 418 Ohm
MDC IDC MSMT LEADCHNL RV IMPEDANCE VALUE: 456 Ohm
MDC IDC SESS DTM: 20190522195845
MDC IDC SET LEADCHNL LV PACING PULSEWIDTH: 0.4 ms
MDC IDC SET LEADCHNL RV PACING AMPLITUDE: 2.5 V
MDC IDC SET LEADCHNL RV SENSING SENSITIVITY: 4 mV
MDC IDC STAT BRADY AP VP PERCENT: 0 %
MDC IDC STAT BRADY AP VS PERCENT: 0 %
MDC IDC STAT BRADY AS VP PERCENT: 100 %
MDC IDC STAT BRADY AS VS PERCENT: 0 %
MDC IDC STAT BRADY RV PERCENT PACED: 99.99 %

## 2018-04-12 DIAGNOSIS — R509 Fever, unspecified: Secondary | ICD-10-CM | POA: Diagnosis not present

## 2018-04-12 DIAGNOSIS — R531 Weakness: Secondary | ICD-10-CM | POA: Diagnosis not present

## 2018-04-12 DIAGNOSIS — I252 Old myocardial infarction: Secondary | ICD-10-CM | POA: Diagnosis not present

## 2018-04-12 DIAGNOSIS — Z6822 Body mass index (BMI) 22.0-22.9, adult: Secondary | ICD-10-CM | POA: Diagnosis not present

## 2018-04-12 DIAGNOSIS — Z951 Presence of aortocoronary bypass graft: Secondary | ICD-10-CM | POA: Diagnosis not present

## 2018-04-12 DIAGNOSIS — M6281 Muscle weakness (generalized): Secondary | ICD-10-CM | POA: Diagnosis not present

## 2018-04-12 DIAGNOSIS — Z8673 Personal history of transient ischemic attack (TIA), and cerebral infarction without residual deficits: Secondary | ICD-10-CM | POA: Diagnosis not present

## 2018-04-12 DIAGNOSIS — I4891 Unspecified atrial fibrillation: Secondary | ICD-10-CM | POA: Diagnosis not present

## 2018-04-12 DIAGNOSIS — J449 Chronic obstructive pulmonary disease, unspecified: Secondary | ICD-10-CM | POA: Diagnosis not present

## 2018-04-12 DIAGNOSIS — R0602 Shortness of breath: Secondary | ICD-10-CM | POA: Diagnosis not present

## 2018-04-12 DIAGNOSIS — Z79899 Other long term (current) drug therapy: Secondary | ICD-10-CM | POA: Diagnosis not present

## 2018-04-12 DIAGNOSIS — Z7901 Long term (current) use of anticoagulants: Secondary | ICD-10-CM | POA: Diagnosis not present

## 2018-04-12 DIAGNOSIS — I2510 Atherosclerotic heart disease of native coronary artery without angina pectoris: Secondary | ICD-10-CM | POA: Diagnosis not present

## 2018-04-19 DIAGNOSIS — N3281 Overactive bladder: Secondary | ICD-10-CM | POA: Diagnosis not present

## 2018-04-20 DIAGNOSIS — Z888 Allergy status to other drugs, medicaments and biological substances status: Secondary | ICD-10-CM | POA: Diagnosis not present

## 2018-04-20 DIAGNOSIS — R197 Diarrhea, unspecified: Secondary | ICD-10-CM

## 2018-04-20 DIAGNOSIS — I160 Hypertensive urgency: Secondary | ICD-10-CM | POA: Diagnosis not present

## 2018-04-20 DIAGNOSIS — Z79899 Other long term (current) drug therapy: Secondary | ICD-10-CM | POA: Diagnosis not present

## 2018-04-20 DIAGNOSIS — I10 Essential (primary) hypertension: Secondary | ICD-10-CM | POA: Diagnosis not present

## 2018-04-20 DIAGNOSIS — I4891 Unspecified atrial fibrillation: Secondary | ICD-10-CM | POA: Diagnosis not present

## 2018-04-20 DIAGNOSIS — Z951 Presence of aortocoronary bypass graft: Secondary | ICD-10-CM

## 2018-04-20 DIAGNOSIS — I517 Cardiomegaly: Secondary | ICD-10-CM | POA: Diagnosis not present

## 2018-04-20 DIAGNOSIS — E039 Hypothyroidism, unspecified: Secondary | ICD-10-CM | POA: Diagnosis not present

## 2018-04-20 DIAGNOSIS — R200 Anesthesia of skin: Secondary | ICD-10-CM | POA: Diagnosis not present

## 2018-04-20 DIAGNOSIS — I482 Chronic atrial fibrillation: Secondary | ICD-10-CM | POA: Diagnosis not present

## 2018-04-20 DIAGNOSIS — Z7901 Long term (current) use of anticoagulants: Secondary | ICD-10-CM | POA: Diagnosis not present

## 2018-04-20 DIAGNOSIS — I082 Rheumatic disorders of both aortic and tricuspid valves: Secondary | ICD-10-CM | POA: Diagnosis not present

## 2018-04-20 DIAGNOSIS — I69354 Hemiplegia and hemiparesis following cerebral infarction affecting left non-dominant side: Secondary | ICD-10-CM | POA: Diagnosis not present

## 2018-04-20 DIAGNOSIS — E785 Hyperlipidemia, unspecified: Secondary | ICD-10-CM | POA: Diagnosis not present

## 2018-04-20 DIAGNOSIS — I2510 Atherosclerotic heart disease of native coronary artery without angina pectoris: Secondary | ICD-10-CM | POA: Insufficient documentation

## 2018-04-20 DIAGNOSIS — R4781 Slurred speech: Secondary | ICD-10-CM | POA: Diagnosis not present

## 2018-04-20 DIAGNOSIS — I4892 Unspecified atrial flutter: Secondary | ICD-10-CM | POA: Diagnosis not present

## 2018-04-20 DIAGNOSIS — Z7951 Long term (current) use of inhaled steroids: Secondary | ICD-10-CM | POA: Diagnosis not present

## 2018-04-20 DIAGNOSIS — J449 Chronic obstructive pulmonary disease, unspecified: Secondary | ICD-10-CM | POA: Diagnosis not present

## 2018-04-20 DIAGNOSIS — R531 Weakness: Secondary | ICD-10-CM | POA: Diagnosis not present

## 2018-04-20 DIAGNOSIS — R2689 Other abnormalities of gait and mobility: Secondary | ICD-10-CM | POA: Diagnosis not present

## 2018-04-20 HISTORY — DX: Presence of aortocoronary bypass graft: Z95.1

## 2018-04-20 HISTORY — DX: Atherosclerotic heart disease of native coronary artery without angina pectoris: I25.10

## 2018-04-20 HISTORY — DX: Diarrhea, unspecified: R19.7

## 2018-04-28 DIAGNOSIS — I4891 Unspecified atrial fibrillation: Secondary | ICD-10-CM | POA: Diagnosis not present

## 2018-04-28 DIAGNOSIS — J449 Chronic obstructive pulmonary disease, unspecified: Secondary | ICD-10-CM | POA: Diagnosis not present

## 2018-04-28 DIAGNOSIS — I2510 Atherosclerotic heart disease of native coronary artery without angina pectoris: Secondary | ICD-10-CM | POA: Diagnosis not present

## 2018-04-28 DIAGNOSIS — Z951 Presence of aortocoronary bypass graft: Secondary | ICD-10-CM | POA: Diagnosis not present

## 2018-04-28 DIAGNOSIS — I10 Essential (primary) hypertension: Secondary | ICD-10-CM | POA: Diagnosis not present

## 2018-04-28 DIAGNOSIS — I160 Hypertensive urgency: Secondary | ICD-10-CM | POA: Diagnosis not present

## 2018-04-28 DIAGNOSIS — E039 Hypothyroidism, unspecified: Secondary | ICD-10-CM | POA: Diagnosis not present

## 2018-04-30 ENCOUNTER — Ambulatory Visit: Payer: Medicare HMO | Admitting: Cardiology

## 2018-05-01 DIAGNOSIS — E039 Hypothyroidism, unspecified: Secondary | ICD-10-CM | POA: Diagnosis not present

## 2018-05-01 DIAGNOSIS — I160 Hypertensive urgency: Secondary | ICD-10-CM | POA: Diagnosis not present

## 2018-05-01 DIAGNOSIS — I2510 Atherosclerotic heart disease of native coronary artery without angina pectoris: Secondary | ICD-10-CM | POA: Diagnosis not present

## 2018-05-01 DIAGNOSIS — J449 Chronic obstructive pulmonary disease, unspecified: Secondary | ICD-10-CM | POA: Diagnosis not present

## 2018-05-01 DIAGNOSIS — Z951 Presence of aortocoronary bypass graft: Secondary | ICD-10-CM | POA: Diagnosis not present

## 2018-05-01 DIAGNOSIS — I10 Essential (primary) hypertension: Secondary | ICD-10-CM | POA: Diagnosis not present

## 2018-05-01 DIAGNOSIS — I4891 Unspecified atrial fibrillation: Secondary | ICD-10-CM | POA: Diagnosis not present

## 2018-05-04 DIAGNOSIS — I10 Essential (primary) hypertension: Secondary | ICD-10-CM | POA: Diagnosis not present

## 2018-05-04 DIAGNOSIS — Z09 Encounter for follow-up examination after completed treatment for conditions other than malignant neoplasm: Secondary | ICD-10-CM | POA: Diagnosis not present

## 2018-05-04 DIAGNOSIS — E039 Hypothyroidism, unspecified: Secondary | ICD-10-CM | POA: Diagnosis not present

## 2018-05-04 DIAGNOSIS — Z79899 Other long term (current) drug therapy: Secondary | ICD-10-CM | POA: Diagnosis not present

## 2018-05-04 DIAGNOSIS — Z6822 Body mass index (BMI) 22.0-22.9, adult: Secondary | ICD-10-CM | POA: Diagnosis not present

## 2018-05-04 DIAGNOSIS — R531 Weakness: Secondary | ICD-10-CM | POA: Diagnosis not present

## 2018-05-04 DIAGNOSIS — R110 Nausea: Secondary | ICD-10-CM | POA: Diagnosis not present

## 2018-05-04 DIAGNOSIS — G250 Essential tremor: Secondary | ICD-10-CM | POA: Diagnosis not present

## 2018-05-05 DIAGNOSIS — I4891 Unspecified atrial fibrillation: Secondary | ICD-10-CM | POA: Diagnosis not present

## 2018-05-05 DIAGNOSIS — I2510 Atherosclerotic heart disease of native coronary artery without angina pectoris: Secondary | ICD-10-CM | POA: Diagnosis not present

## 2018-05-05 DIAGNOSIS — I10 Essential (primary) hypertension: Secondary | ICD-10-CM | POA: Diagnosis not present

## 2018-05-05 DIAGNOSIS — J449 Chronic obstructive pulmonary disease, unspecified: Secondary | ICD-10-CM | POA: Diagnosis not present

## 2018-05-05 DIAGNOSIS — E039 Hypothyroidism, unspecified: Secondary | ICD-10-CM | POA: Diagnosis not present

## 2018-05-05 DIAGNOSIS — Z951 Presence of aortocoronary bypass graft: Secondary | ICD-10-CM | POA: Diagnosis not present

## 2018-05-05 DIAGNOSIS — I160 Hypertensive urgency: Secondary | ICD-10-CM | POA: Diagnosis not present

## 2018-05-07 DIAGNOSIS — J449 Chronic obstructive pulmonary disease, unspecified: Secondary | ICD-10-CM | POA: Diagnosis not present

## 2018-05-07 DIAGNOSIS — I4891 Unspecified atrial fibrillation: Secondary | ICD-10-CM | POA: Diagnosis not present

## 2018-05-07 DIAGNOSIS — I2510 Atherosclerotic heart disease of native coronary artery without angina pectoris: Secondary | ICD-10-CM | POA: Diagnosis not present

## 2018-05-07 DIAGNOSIS — I160 Hypertensive urgency: Secondary | ICD-10-CM | POA: Diagnosis not present

## 2018-05-07 DIAGNOSIS — E039 Hypothyroidism, unspecified: Secondary | ICD-10-CM | POA: Diagnosis not present

## 2018-05-07 DIAGNOSIS — Z951 Presence of aortocoronary bypass graft: Secondary | ICD-10-CM | POA: Diagnosis not present

## 2018-05-07 DIAGNOSIS — I10 Essential (primary) hypertension: Secondary | ICD-10-CM | POA: Diagnosis not present

## 2018-05-08 DIAGNOSIS — N3001 Acute cystitis with hematuria: Secondary | ICD-10-CM | POA: Diagnosis not present

## 2018-05-08 DIAGNOSIS — N3091 Cystitis, unspecified with hematuria: Secondary | ICD-10-CM | POA: Diagnosis not present

## 2018-05-10 DIAGNOSIS — I2510 Atherosclerotic heart disease of native coronary artery without angina pectoris: Secondary | ICD-10-CM | POA: Diagnosis not present

## 2018-05-10 DIAGNOSIS — E039 Hypothyroidism, unspecified: Secondary | ICD-10-CM | POA: Diagnosis not present

## 2018-05-10 DIAGNOSIS — I160 Hypertensive urgency: Secondary | ICD-10-CM | POA: Diagnosis not present

## 2018-05-10 DIAGNOSIS — Z951 Presence of aortocoronary bypass graft: Secondary | ICD-10-CM | POA: Diagnosis not present

## 2018-05-10 DIAGNOSIS — I4891 Unspecified atrial fibrillation: Secondary | ICD-10-CM | POA: Diagnosis not present

## 2018-05-10 DIAGNOSIS — J449 Chronic obstructive pulmonary disease, unspecified: Secondary | ICD-10-CM | POA: Diagnosis not present

## 2018-05-10 DIAGNOSIS — I10 Essential (primary) hypertension: Secondary | ICD-10-CM | POA: Diagnosis not present

## 2018-05-13 DIAGNOSIS — I10 Essential (primary) hypertension: Secondary | ICD-10-CM | POA: Diagnosis not present

## 2018-05-13 DIAGNOSIS — Z951 Presence of aortocoronary bypass graft: Secondary | ICD-10-CM | POA: Diagnosis not present

## 2018-05-13 DIAGNOSIS — E039 Hypothyroidism, unspecified: Secondary | ICD-10-CM | POA: Diagnosis not present

## 2018-05-13 DIAGNOSIS — I160 Hypertensive urgency: Secondary | ICD-10-CM | POA: Diagnosis not present

## 2018-05-13 DIAGNOSIS — I4891 Unspecified atrial fibrillation: Secondary | ICD-10-CM | POA: Diagnosis not present

## 2018-05-13 DIAGNOSIS — I2510 Atherosclerotic heart disease of native coronary artery without angina pectoris: Secondary | ICD-10-CM | POA: Diagnosis not present

## 2018-05-13 DIAGNOSIS — J449 Chronic obstructive pulmonary disease, unspecified: Secondary | ICD-10-CM | POA: Diagnosis not present

## 2018-05-14 DIAGNOSIS — Z7409 Other reduced mobility: Secondary | ICD-10-CM | POA: Diagnosis not present

## 2018-05-17 DIAGNOSIS — I4891 Unspecified atrial fibrillation: Secondary | ICD-10-CM | POA: Diagnosis not present

## 2018-05-17 DIAGNOSIS — I160 Hypertensive urgency: Secondary | ICD-10-CM | POA: Diagnosis not present

## 2018-05-17 DIAGNOSIS — E039 Hypothyroidism, unspecified: Secondary | ICD-10-CM | POA: Diagnosis not present

## 2018-05-17 DIAGNOSIS — Z951 Presence of aortocoronary bypass graft: Secondary | ICD-10-CM | POA: Diagnosis not present

## 2018-05-17 DIAGNOSIS — J449 Chronic obstructive pulmonary disease, unspecified: Secondary | ICD-10-CM | POA: Diagnosis not present

## 2018-05-17 DIAGNOSIS — I10 Essential (primary) hypertension: Secondary | ICD-10-CM | POA: Diagnosis not present

## 2018-05-17 DIAGNOSIS — N3281 Overactive bladder: Secondary | ICD-10-CM | POA: Diagnosis not present

## 2018-05-17 DIAGNOSIS — N390 Urinary tract infection, site not specified: Secondary | ICD-10-CM | POA: Diagnosis not present

## 2018-05-17 DIAGNOSIS — I2510 Atherosclerotic heart disease of native coronary artery without angina pectoris: Secondary | ICD-10-CM | POA: Diagnosis not present

## 2018-05-21 DIAGNOSIS — E039 Hypothyroidism, unspecified: Secondary | ICD-10-CM | POA: Diagnosis not present

## 2018-05-21 DIAGNOSIS — J449 Chronic obstructive pulmonary disease, unspecified: Secondary | ICD-10-CM | POA: Diagnosis not present

## 2018-05-21 DIAGNOSIS — I4891 Unspecified atrial fibrillation: Secondary | ICD-10-CM | POA: Diagnosis not present

## 2018-05-21 DIAGNOSIS — I10 Essential (primary) hypertension: Secondary | ICD-10-CM | POA: Diagnosis not present

## 2018-05-21 DIAGNOSIS — Z951 Presence of aortocoronary bypass graft: Secondary | ICD-10-CM | POA: Diagnosis not present

## 2018-05-21 DIAGNOSIS — I160 Hypertensive urgency: Secondary | ICD-10-CM | POA: Diagnosis not present

## 2018-05-21 DIAGNOSIS — I2510 Atherosclerotic heart disease of native coronary artery without angina pectoris: Secondary | ICD-10-CM | POA: Diagnosis not present

## 2018-05-25 DIAGNOSIS — I4891 Unspecified atrial fibrillation: Secondary | ICD-10-CM | POA: Diagnosis not present

## 2018-05-25 DIAGNOSIS — Z9181 History of falling: Secondary | ICD-10-CM | POA: Diagnosis not present

## 2018-05-25 DIAGNOSIS — Z6822 Body mass index (BMI) 22.0-22.9, adult: Secondary | ICD-10-CM | POA: Diagnosis not present

## 2018-05-25 DIAGNOSIS — E039 Hypothyroidism, unspecified: Secondary | ICD-10-CM | POA: Diagnosis not present

## 2018-05-25 DIAGNOSIS — Z1339 Encounter for screening examination for other mental health and behavioral disorders: Secondary | ICD-10-CM | POA: Diagnosis not present

## 2018-05-25 DIAGNOSIS — I10 Essential (primary) hypertension: Secondary | ICD-10-CM | POA: Diagnosis not present

## 2018-05-25 DIAGNOSIS — G250 Essential tremor: Secondary | ICD-10-CM | POA: Diagnosis not present

## 2018-05-25 DIAGNOSIS — Z79899 Other long term (current) drug therapy: Secondary | ICD-10-CM | POA: Diagnosis not present

## 2018-05-25 DIAGNOSIS — E782 Mixed hyperlipidemia: Secondary | ICD-10-CM | POA: Diagnosis not present

## 2018-05-26 DIAGNOSIS — Z951 Presence of aortocoronary bypass graft: Secondary | ICD-10-CM | POA: Diagnosis not present

## 2018-05-26 DIAGNOSIS — J449 Chronic obstructive pulmonary disease, unspecified: Secondary | ICD-10-CM | POA: Diagnosis not present

## 2018-05-26 DIAGNOSIS — I160 Hypertensive urgency: Secondary | ICD-10-CM | POA: Diagnosis not present

## 2018-05-26 DIAGNOSIS — I2510 Atherosclerotic heart disease of native coronary artery without angina pectoris: Secondary | ICD-10-CM | POA: Diagnosis not present

## 2018-05-26 DIAGNOSIS — I10 Essential (primary) hypertension: Secondary | ICD-10-CM | POA: Diagnosis not present

## 2018-05-26 DIAGNOSIS — E039 Hypothyroidism, unspecified: Secondary | ICD-10-CM | POA: Diagnosis not present

## 2018-05-26 DIAGNOSIS — I4891 Unspecified atrial fibrillation: Secondary | ICD-10-CM | POA: Diagnosis not present

## 2018-05-27 DIAGNOSIS — J449 Chronic obstructive pulmonary disease, unspecified: Secondary | ICD-10-CM | POA: Diagnosis not present

## 2018-05-27 DIAGNOSIS — R0602 Shortness of breath: Secondary | ICD-10-CM | POA: Diagnosis not present

## 2018-05-27 DIAGNOSIS — Z6824 Body mass index (BMI) 24.0-24.9, adult: Secondary | ICD-10-CM | POA: Diagnosis not present

## 2018-06-02 DIAGNOSIS — I10 Essential (primary) hypertension: Secondary | ICD-10-CM | POA: Diagnosis not present

## 2018-06-02 DIAGNOSIS — I160 Hypertensive urgency: Secondary | ICD-10-CM | POA: Diagnosis not present

## 2018-06-02 DIAGNOSIS — N390 Urinary tract infection, site not specified: Secondary | ICD-10-CM | POA: Diagnosis not present

## 2018-06-02 DIAGNOSIS — E039 Hypothyroidism, unspecified: Secondary | ICD-10-CM | POA: Diagnosis not present

## 2018-06-02 DIAGNOSIS — J449 Chronic obstructive pulmonary disease, unspecified: Secondary | ICD-10-CM | POA: Diagnosis not present

## 2018-06-02 DIAGNOSIS — Z951 Presence of aortocoronary bypass graft: Secondary | ICD-10-CM | POA: Diagnosis not present

## 2018-06-02 DIAGNOSIS — I4891 Unspecified atrial fibrillation: Secondary | ICD-10-CM | POA: Diagnosis not present

## 2018-06-02 DIAGNOSIS — I2510 Atherosclerotic heart disease of native coronary artery without angina pectoris: Secondary | ICD-10-CM | POA: Diagnosis not present

## 2018-06-09 DIAGNOSIS — E039 Hypothyroidism, unspecified: Secondary | ICD-10-CM | POA: Diagnosis not present

## 2018-06-09 DIAGNOSIS — I160 Hypertensive urgency: Secondary | ICD-10-CM | POA: Diagnosis not present

## 2018-06-09 DIAGNOSIS — I4891 Unspecified atrial fibrillation: Secondary | ICD-10-CM | POA: Diagnosis not present

## 2018-06-09 DIAGNOSIS — I10 Essential (primary) hypertension: Secondary | ICD-10-CM | POA: Diagnosis not present

## 2018-06-09 DIAGNOSIS — Z951 Presence of aortocoronary bypass graft: Secondary | ICD-10-CM | POA: Diagnosis not present

## 2018-06-09 DIAGNOSIS — J449 Chronic obstructive pulmonary disease, unspecified: Secondary | ICD-10-CM | POA: Diagnosis not present

## 2018-06-09 DIAGNOSIS — I2510 Atherosclerotic heart disease of native coronary artery without angina pectoris: Secondary | ICD-10-CM | POA: Diagnosis not present

## 2018-06-14 DIAGNOSIS — N3281 Overactive bladder: Secondary | ICD-10-CM | POA: Diagnosis not present

## 2018-06-14 DIAGNOSIS — Z7409 Other reduced mobility: Secondary | ICD-10-CM | POA: Diagnosis not present

## 2018-06-16 ENCOUNTER — Ambulatory Visit (INDEPENDENT_AMBULATORY_CARE_PROVIDER_SITE_OTHER): Payer: Medicare HMO | Admitting: *Deleted

## 2018-06-16 ENCOUNTER — Telehealth: Payer: Self-pay | Admitting: Cardiology

## 2018-06-16 DIAGNOSIS — I482 Chronic atrial fibrillation, unspecified: Secondary | ICD-10-CM

## 2018-06-16 DIAGNOSIS — I442 Atrioventricular block, complete: Secondary | ICD-10-CM

## 2018-06-16 NOTE — Telephone Encounter (Signed)
Spoke with pt and reminded pt of remote transmission that is due today. Pt verbalized understanding.   

## 2018-06-17 NOTE — Progress Notes (Signed)
Remote pacemaker transmission.   

## 2018-06-18 ENCOUNTER — Encounter: Payer: Self-pay | Admitting: Cardiology

## 2018-06-18 DIAGNOSIS — E039 Hypothyroidism, unspecified: Secondary | ICD-10-CM | POA: Diagnosis not present

## 2018-06-18 DIAGNOSIS — J449 Chronic obstructive pulmonary disease, unspecified: Secondary | ICD-10-CM | POA: Diagnosis not present

## 2018-06-18 DIAGNOSIS — N3000 Acute cystitis without hematuria: Secondary | ICD-10-CM | POA: Diagnosis not present

## 2018-06-18 DIAGNOSIS — I10 Essential (primary) hypertension: Secondary | ICD-10-CM | POA: Diagnosis not present

## 2018-06-18 DIAGNOSIS — N3090 Cystitis, unspecified without hematuria: Secondary | ICD-10-CM | POA: Diagnosis not present

## 2018-06-18 DIAGNOSIS — I2510 Atherosclerotic heart disease of native coronary artery without angina pectoris: Secondary | ICD-10-CM | POA: Diagnosis not present

## 2018-06-18 DIAGNOSIS — Z951 Presence of aortocoronary bypass graft: Secondary | ICD-10-CM | POA: Diagnosis not present

## 2018-06-18 DIAGNOSIS — I160 Hypertensive urgency: Secondary | ICD-10-CM | POA: Diagnosis not present

## 2018-06-18 DIAGNOSIS — I4891 Unspecified atrial fibrillation: Secondary | ICD-10-CM | POA: Diagnosis not present

## 2018-06-18 DIAGNOSIS — N318 Other neuromuscular dysfunction of bladder: Secondary | ICD-10-CM | POA: Diagnosis not present

## 2018-06-23 DIAGNOSIS — I10 Essential (primary) hypertension: Secondary | ICD-10-CM | POA: Diagnosis not present

## 2018-06-23 DIAGNOSIS — N3000 Acute cystitis without hematuria: Secondary | ICD-10-CM | POA: Diagnosis not present

## 2018-06-23 DIAGNOSIS — R1030 Lower abdominal pain, unspecified: Secondary | ICD-10-CM | POA: Diagnosis not present

## 2018-06-23 DIAGNOSIS — Z951 Presence of aortocoronary bypass graft: Secondary | ICD-10-CM | POA: Diagnosis not present

## 2018-06-23 DIAGNOSIS — I160 Hypertensive urgency: Secondary | ICD-10-CM | POA: Diagnosis not present

## 2018-06-23 DIAGNOSIS — J449 Chronic obstructive pulmonary disease, unspecified: Secondary | ICD-10-CM | POA: Diagnosis not present

## 2018-06-23 DIAGNOSIS — I714 Abdominal aortic aneurysm, without rupture: Secondary | ICD-10-CM | POA: Diagnosis not present

## 2018-06-23 DIAGNOSIS — E039 Hypothyroidism, unspecified: Secondary | ICD-10-CM | POA: Diagnosis not present

## 2018-06-23 DIAGNOSIS — I4891 Unspecified atrial fibrillation: Secondary | ICD-10-CM | POA: Diagnosis not present

## 2018-06-23 DIAGNOSIS — I2510 Atherosclerotic heart disease of native coronary artery without angina pectoris: Secondary | ICD-10-CM | POA: Diagnosis not present

## 2018-07-14 DIAGNOSIS — N318 Other neuromuscular dysfunction of bladder: Secondary | ICD-10-CM | POA: Diagnosis not present

## 2018-07-14 DIAGNOSIS — N3020 Other chronic cystitis without hematuria: Secondary | ICD-10-CM | POA: Diagnosis not present

## 2018-07-15 DIAGNOSIS — Z7409 Other reduced mobility: Secondary | ICD-10-CM | POA: Diagnosis not present

## 2018-07-20 DIAGNOSIS — N3281 Overactive bladder: Secondary | ICD-10-CM | POA: Diagnosis not present

## 2018-07-22 LAB — CUP PACEART REMOTE DEVICE CHECK
Battery Remaining Longevity: 42 mo
Battery Voltage: 2.98 V
Brady Statistic AP VP Percent: 0 %
Brady Statistic AS VP Percent: 100 %
Brady Statistic AS VS Percent: 0 %
Brady Statistic RA Percent Paced: 0 %
Brady Statistic RV Percent Paced: 99.97 %
Date Time Interrogation Session: 20190821212433
Implantable Lead Implant Date: 20150916
Implantable Lead Implant Date: 20150916
Implantable Lead Location: 753860
Implantable Lead Location: 753860
Implantable Lead Model: 5076
Implantable Lead Model: 5076
Lead Channel Impedance Value: 285 Ohm
Lead Channel Impedance Value: 304 Ohm
Lead Channel Impedance Value: 323 Ohm
Lead Channel Impedance Value: 380 Ohm
Lead Channel Impedance Value: 418 Ohm
Lead Channel Impedance Value: 418 Ohm
Lead Channel Impedance Value: 684 Ohm
Lead Channel Impedance Value: 741 Ohm
Lead Channel Pacing Threshold Amplitude: 0.5 V
Lead Channel Pacing Threshold Amplitude: 0.625 V
Lead Channel Pacing Threshold Amplitude: 0.75 V
Lead Channel Pacing Threshold Pulse Width: 0.4 ms
Lead Channel Pacing Threshold Pulse Width: 0.4 ms
Lead Channel Sensing Intrinsic Amplitude: 10.375 mV
Lead Channel Setting Pacing Amplitude: 2 V
Lead Channel Setting Pacing Pulse Width: 0.4 ms
MDC IDC LEAD IMPLANT DT: 20150916
MDC IDC LEAD LOCATION: 753859
MDC IDC MSMT LEADCHNL LV IMPEDANCE VALUE: 684 Ohm
MDC IDC MSMT LEADCHNL RA PACING THRESHOLD PULSEWIDTH: 0.4 ms
MDC IDC MSMT LEADCHNL RA SENSING INTR AMPL: 3.25 mV
MDC IDC MSMT LEADCHNL RA SENSING INTR AMPL: 3.25 mV
MDC IDC MSMT LEADCHNL RV SENSING INTR AMPL: 10.375 mV
MDC IDC PG IMPLANT DT: 20150916
MDC IDC SET LEADCHNL RV PACING AMPLITUDE: 2.5 V
MDC IDC SET LEADCHNL RV PACING PULSEWIDTH: 0.4 ms
MDC IDC SET LEADCHNL RV SENSING SENSITIVITY: 4 mV
MDC IDC STAT BRADY AP VS PERCENT: 0 %

## 2018-07-26 DIAGNOSIS — Z23 Encounter for immunization: Secondary | ICD-10-CM | POA: Diagnosis not present

## 2018-07-26 DIAGNOSIS — F418 Other specified anxiety disorders: Secondary | ICD-10-CM | POA: Diagnosis not present

## 2018-07-26 DIAGNOSIS — M545 Low back pain: Secondary | ICD-10-CM | POA: Diagnosis not present

## 2018-07-26 DIAGNOSIS — R54 Age-related physical debility: Secondary | ICD-10-CM | POA: Diagnosis not present

## 2018-07-26 DIAGNOSIS — Z6822 Body mass index (BMI) 22.0-22.9, adult: Secondary | ICD-10-CM | POA: Diagnosis not present

## 2018-07-26 DIAGNOSIS — E039 Hypothyroidism, unspecified: Secondary | ICD-10-CM | POA: Diagnosis not present

## 2018-07-26 DIAGNOSIS — R1013 Epigastric pain: Secondary | ICD-10-CM | POA: Diagnosis not present

## 2018-07-26 DIAGNOSIS — R109 Unspecified abdominal pain: Secondary | ICD-10-CM | POA: Diagnosis not present

## 2018-07-26 DIAGNOSIS — E559 Vitamin D deficiency, unspecified: Secondary | ICD-10-CM | POA: Diagnosis not present

## 2018-08-06 ENCOUNTER — Encounter: Payer: Self-pay | Admitting: Gastroenterology

## 2018-08-06 ENCOUNTER — Ambulatory Visit (INDEPENDENT_AMBULATORY_CARE_PROVIDER_SITE_OTHER): Payer: Medicare HMO | Admitting: Gastroenterology

## 2018-08-06 ENCOUNTER — Ambulatory Visit (HOSPITAL_BASED_OUTPATIENT_CLINIC_OR_DEPARTMENT_OTHER)
Admission: RE | Admit: 2018-08-06 | Discharge: 2018-08-06 | Disposition: A | Discharge status: Home / Self Care | Payer: Medicare HMO | Source: Ambulatory Visit | Attending: Gastroenterology | Admitting: Gastroenterology

## 2018-08-06 VITALS — BP 110/66 | HR 64 | Ht 60.0 in

## 2018-08-06 DIAGNOSIS — R1030 Lower abdominal pain, unspecified: Secondary | ICD-10-CM

## 2018-08-06 DIAGNOSIS — K5900 Constipation, unspecified: Secondary | ICD-10-CM | POA: Diagnosis not present

## 2018-08-06 DIAGNOSIS — K449 Diaphragmatic hernia without obstruction or gangrene: Secondary | ICD-10-CM | POA: Diagnosis not present

## 2018-08-06 NOTE — Progress Notes (Signed)
Chief Complaint: Abdominal pain  Referring Provider:  Nicholos Johns, MD      ASSESSMENT AND PLAN;   #1. Lower abdominal pain with H/O constipation. Had NCCT at [facility] few months ago (ordered by Dr. Comer Locket)  #2. GERD with HH (EGD 01/2014 moderate hiatal hernia, duodenal diverticulum)  Plan: - X-ray AAS. - Miralax 17g po qd. - Please obtain previous records (Dr Rica Records - blood tests, NCCT from [facility]) - She does not want colonoscopy. I do agree.  She had been scheduled for colonoscopy previously but canceled.  Of note that the patient is on Eliquis and very high risk for CVA. She is also highly allergic to IV contrast. -Continue Dexilant 60 mg p.o. once a day - Discussed in detail with the patient and patient's daughter. HPI:    Nichole Nelson is a 83 y.o. female  Seen at the request of Dr. Rica Records Lower abdominal pain-more or less constant, gets better with defecation With bloating Long-standing history of constipation and has been on MiraLAX every other day.  If she takes MiraLAX every day sometimes would result in diarrhea. Reflux under good control with Dexilant No upper GI symptoms NCCT at [facility] - neg -ordered by Dr. Comer Locket. No melena or hematochezia-patient is on Eliquis Had recent TIA/CVA with slurred speech.  Told never to stop Eliquis. Has been scheduled for colonoscopy in the past but called and canceled. She does understand that there is small but definite chance of missing colorectal neoplasms.   Past Medical History:  Diagnosis Date  . COPD (chronic obstructive pulmonary disease) ([place])   . Hyperlipidemia   . Hypertension   . Thyroid disease     Past Surgical History:  Procedure Laterality Date  . ABDOMINAL HYSTERECTOMY    . AORTIC VALVE REPAIR    . APPENDECTOMY    . CORONARY ARTERY BYPASS GRAFT    . ESOPHAGOGASTRODUODENOSCOPY  01/26/2014   Moderate hiatal hernia. presbyesophagus. No evidence of upper GI bleeding.   Marland Kitchen HERNIA REPAIR    . INSERT / REPLACE /  REMOVE PACEMAKER     [employer]    Family History  Problem Relation Age of Onset  . Stroke Mother   . Kidney failure Mother   . Aneurysm Father     Social History   Tobacco Use  . Smoking status: Never Smoker  . Smokeless tobacco: Never Used  Substance Use Topics  . Alcohol use: No  . Drug use: No    Current Outpatient Medications  Medication Sig Dispense Refill  . apixaban (ELIQUIS) 2.5 MG TABS tablet Take 2.5 mg by mouth 2 (two) times daily.    Marland Kitchen CARTIA XT 120 MG 24 hr capsule Take 1 tablet by mouth daily.    . Cholecalciferol (VITAMIN D3) 2000 units capsule Take 1 capsule by mouth daily.    . clonazePAM (KLONOPIN) 0.5 MG tablet Take 0.5 mg by mouth 2 (two) times daily.    Marland Kitchen dexlansoprazole (DEXILANT) 60 MG capsule Take 60 mg by mouth daily.    Marland Kitchen escitalopram (LEXAPRO) 10 MG tablet Take 10 mg by mouth daily.    . ferrous sulfate 325 (65 FE) MG tablet Take 325 mg by mouth daily.    . fluticasone furoate-vilanterol (BREO ELLIPTA) 100-25 MCG/INH AEPB Inhale 1 puff into the lungs daily.    Marland Kitchen gabapentin (NEURONTIN) 100 MG capsule Take 100 mg by mouth 4 (four) times daily.    Marland Kitchen levothyroxine (SYNTHROID, LEVOTHROID) 88 MCG tablet Take 1 tablet by mouth daily.    Marland Kitchen  mometasone (NASONEX) 50 MCG/ACT nasal spray Place 1 spray into both nostrils daily.    . montelukast (SINGULAIR) 10 MG tablet Take 10 mg by mouth daily.    . Omega-3 Fatty Acids (FISH OIL) 1000 MG CAPS Take 2,000 mg by mouth 2 (two) times daily.    . ondansetron (ZOFRAN) 4 MG tablet Take 4 mg by mouth every 8 (eight) hours as needed for nausea/vomiting.    . pravastatin (PRAVACHOL) 80 MG tablet Take 80 mg by mouth daily.    . traMADol (ULTRAM) 50 MG tablet Take 100 mg by mouth every 8 (eight) hours as needed for pain.     No current facility-administered medications for this visit.     Allergies  Allergen Reactions  . Iodinated Diagnostic Agents Other (See Comments)    stroke  . Antihistamines, Chlorpheniramine-Type  Palpitations  . Meperidine Nausea And Vomiting    Review of Systems:  Constitutional: Denies fever, chills, diaphoresis, appetite change and has fatigue.  HEENT: Denies photophobia, eye pain, redness, hearing loss, ear pain, congestion, sore throat, rhinorrhea, sneezing, mouth sores, neck pain, neck stiffness and tinnitus.   Respiratory: Denies SOB, DOE, cough, chest tightness,  and wheezing.   Cardiovascular: Denies chest pain, palpitations and leg swelling.  Genitourinary: Denies dysuria, urgency, frequency, hematuria, flank pain and difficulty urinating.  Musculoskeletal: Has myalgias, low back pain Skin: No rash.  Neurological: Denies dizziness, seizures, syncope, weakness, light-headedness, numbness and headaches.  Hematological: Denies adenopathy. Easy bruising, personal or family bleeding history  Psychiatric/Behavioral: No anxiety or depression     Physical Exam:    BP 110/66   Pulse 64   Ht 5' (1.524 m)   BMI 22.89 kg/m  There were no vitals filed for this visit. Constitutional:  Well-developed, in no acute distress.  Patient on wheelchair. Psychiatric: Normal mood and affect. Behavior is normal. HEENT: Pupils normal.  Conjunctivae are normal. No scleral icterus. Neck supple.  Cardiovascular: Normal rate, regular rhythm. No edema Pulmonary/chest: Effort normal and breath sounds normal. No wheezing, rales or rhonchi. Abdominal: Soft, nondistended. Nontender. Bowel sounds active throughout. There are no masses palpable. No hepatomegaly. Rectal:  defered Neurological: Alert and oriented to person place and time. Skin: Skin is warm and dry. No rashes noted.  Data Reviewed: I have personally reviewed following labs and imaging studies  CBC: No flowsheet data found.  CMP: CMP Latest Ref Rng & Units 05/21/2017  Glucose 65 - 99 mg/dL 113(H)  BUN 8 - 27 mg/dL 17  Creatinine 0.57 - 1.00 mg/dL 1.15(H)  Sodium 134 - 144 mmol/L 138  Potassium 3.5 - 5.2 mmol/L 5.2  Chloride  96 - 106 mmol/L 100  CO2 20 - 29 mmol/L 24  Calcium 8.7 - 10.3 mg/dL 9.6     Carmell Austria, MD 08/06/2018, 10:49 AM  Cc: Nicholos Johns, MD

## 2018-08-06 NOTE — Patient Instructions (Signed)
If you are age 81 or older, your body mass index should be between 23-30. Your Body mass index is 22.89 kg/m. If this is out of the aforementioned range listed, please consider follow up with your Primary Care Provider.  If you are age 39 or younger, your body mass index should be between 19-25. Your Body mass index is 22.89 kg/m. If this is out of the aformentioned range listed, please consider follow up with your Primary Care Provider.   Your provider has requested that you have an abdominal x ray before leaving today. Please go to the 1st floor to our Radiology department for the test.   Thank you,  Dr. Peyton Bottoms Gupta]

## 2018-08-09 ENCOUNTER — Encounter: Payer: Medicare HMO | Admitting: Cardiology

## 2018-08-14 DIAGNOSIS — Z7409 Other reduced mobility: Secondary | ICD-10-CM | POA: Diagnosis not present

## 2018-08-17 DIAGNOSIS — N3281 Overactive bladder: Secondary | ICD-10-CM | POA: Diagnosis not present

## 2018-08-30 DIAGNOSIS — I10 Essential (primary) hypertension: Secondary | ICD-10-CM | POA: Diagnosis not present

## 2018-08-30 DIAGNOSIS — R54 Age-related physical debility: Secondary | ICD-10-CM | POA: Diagnosis not present

## 2018-08-30 DIAGNOSIS — E559 Vitamin D deficiency, unspecified: Secondary | ICD-10-CM | POA: Diagnosis not present

## 2018-08-30 DIAGNOSIS — E039 Hypothyroidism, unspecified: Secondary | ICD-10-CM | POA: Diagnosis not present

## 2018-08-30 DIAGNOSIS — R109 Unspecified abdominal pain: Secondary | ICD-10-CM | POA: Diagnosis not present

## 2018-08-30 DIAGNOSIS — Z6822 Body mass index (BMI) 22.0-22.9, adult: Secondary | ICD-10-CM | POA: Diagnosis not present

## 2018-08-30 DIAGNOSIS — E782 Mixed hyperlipidemia: Secondary | ICD-10-CM | POA: Diagnosis not present

## 2018-08-30 DIAGNOSIS — F418 Other specified anxiety disorders: Secondary | ICD-10-CM | POA: Diagnosis not present

## 2018-08-30 DIAGNOSIS — R748 Abnormal levels of other serum enzymes: Secondary | ICD-10-CM | POA: Diagnosis not present

## 2018-08-30 DIAGNOSIS — M19011 Primary osteoarthritis, right shoulder: Secondary | ICD-10-CM | POA: Diagnosis not present

## 2018-09-14 DIAGNOSIS — N3281 Overactive bladder: Secondary | ICD-10-CM | POA: Diagnosis not present

## 2018-09-14 DIAGNOSIS — Z7409 Other reduced mobility: Secondary | ICD-10-CM | POA: Diagnosis not present

## 2018-09-15 ENCOUNTER — Telehealth: Payer: Self-pay

## 2018-09-15 ENCOUNTER — Ambulatory Visit (INDEPENDENT_AMBULATORY_CARE_PROVIDER_SITE_OTHER): Payer: Medicare HMO

## 2018-09-15 DIAGNOSIS — Z950 Presence of cardiac pacemaker: Secondary | ICD-10-CM

## 2018-09-15 DIAGNOSIS — I442 Atrioventricular block, complete: Secondary | ICD-10-CM | POA: Diagnosis not present

## 2018-09-15 DIAGNOSIS — I4821 Permanent atrial fibrillation: Secondary | ICD-10-CM

## 2018-09-15 DIAGNOSIS — Z9889 Other specified postprocedural states: Secondary | ICD-10-CM

## 2018-09-15 DIAGNOSIS — I6522 Occlusion and stenosis of left carotid artery: Secondary | ICD-10-CM

## 2018-09-15 DIAGNOSIS — I10 Essential (primary) hypertension: Secondary | ICD-10-CM

## 2018-09-15 NOTE — Telephone Encounter (Signed)
Spoke with pt and reminded pt of remote transmission that is due today. Pt verbalized understanding.   

## 2018-09-16 NOTE — Progress Notes (Signed)
Remote pacemaker transmission.   

## 2018-09-27 ENCOUNTER — Encounter: Payer: Self-pay | Admitting: Gastroenterology

## 2018-09-27 ENCOUNTER — Other Ambulatory Visit (INDEPENDENT_AMBULATORY_CARE_PROVIDER_SITE_OTHER): Payer: Medicare HMO

## 2018-09-27 ENCOUNTER — Ambulatory Visit (INDEPENDENT_AMBULATORY_CARE_PROVIDER_SITE_OTHER): Payer: Medicare HMO | Admitting: Gastroenterology

## 2018-09-27 VITALS — BP 132/74 | HR 76 | Ht 60.0 in

## 2018-09-27 DIAGNOSIS — K5900 Constipation, unspecified: Secondary | ICD-10-CM

## 2018-09-27 DIAGNOSIS — R1030 Lower abdominal pain, unspecified: Secondary | ICD-10-CM | POA: Diagnosis not present

## 2018-09-27 DIAGNOSIS — R1013 Epigastric pain: Secondary | ICD-10-CM

## 2018-09-27 LAB — COMPREHENSIVE METABOLIC PANEL
ALT: 15 U/L (ref 0–35)
AST: 20 U/L (ref 0–37)
Albumin: 4 g/dL (ref 3.5–5.2)
Alkaline Phosphatase: 71 U/L (ref 39–117)
BUN: 19 mg/dL (ref 6–23)
CO2: 25 meq/L (ref 19–32)
Calcium: 9.2 mg/dL (ref 8.4–10.5)
Chloride: 105 mEq/L (ref 96–112)
Creatinine, Ser: 0.99 mg/dL (ref 0.40–1.20)
GFR: 57.2 mL/min — ABNORMAL LOW (ref >60.00)
Glucose, Bld: 87 mg/dL (ref 70–99)
Potassium: 5.1 mEq/L (ref 3.5–5.1)
Sodium: 140 mEq/L (ref 135–145)
Total Bilirubin: 0.5 mg/dL (ref 0.2–1.2)
Total Protein: 6.8 g/dL (ref 6.0–8.3)

## 2018-09-27 LAB — CBC WITH DIFFERENTIAL/PLATELET
Basophils Absolute: 0.1 10*3/uL (ref 0.0–0.1)
Basophils Relative: 1.2 % (ref 0.0–3.0)
Eosinophils Absolute: 0.1 10*3/uL (ref 0.0–0.7)
Eosinophils Relative: 1.4 % (ref 0.0–5.0)
HEMATOCRIT: 38.4 % (ref 36.0–46.0)
HEMOGLOBIN: 12.3 g/dL (ref 12.0–15.0)
Lymphocytes Relative: 21.5 % (ref 12.0–46.0)
Lymphs Abs: 1.8 10*3/uL (ref 0.7–4.0)
MCHC: 32.1 g/dL (ref 30.0–36.0)
MCV: 91 fl (ref 78.0–100.0)
Monocytes Absolute: 0.6 10*3/uL (ref 0.1–1.0)
Monocytes Relative: 7.4 % (ref 3.0–12.0)
NEUTROS ABS: 5.8 10*3/uL (ref 1.4–7.7)
Neutrophils Relative %: 68.5 % (ref 43.0–77.0)
Platelets: 212 10*3/uL (ref 150.0–400.0)
RBC: 4.21 Mil/uL (ref 3.87–5.11)
RDW: 17.8 % — ABNORMAL HIGH (ref 11.5–15.5)
WBC: 8.4 10*3/uL (ref 4.0–10.5)

## 2018-09-27 LAB — AMYLASE: Amylase: 57 U/L (ref 27–131)

## 2018-09-27 LAB — LIPASE: Lipase: 12 U/L (ref 11.0–59.0)

## 2018-09-27 NOTE — Progress Notes (Signed)
Chief Complaint: Abdominal pain  Referring Provider:  Nicholos Johns, MD      ASSESSMENT AND PLAN;   #1.  Lower abdominal pain with H/O constipation (resolved). Had NCCT at [facility] few months ago (ordered by Dr. Comer Locket) #2.  GERD with HH (EGD 01/2014 moderate hiatal hernia, duodenal diverticulum) #3.  Asymptomatic elevation of amylase. #4.  Comorbid conditions including CAD, COPD, HTN, CKD3, chronic A. Fib s/p AV ablation with pacemaker, CVA on Eliquis, advanced age.  Plan: - Check CBC, CMP, amylase and lipase today - Miralax 17g po qd to continue. - I also discussed regarding CT scan abdo/pelvis.  Patient and patient's daughter would like to hold off as she is highly allergic to IV contrast.  She has also been told to avoid MRI contrast due to chronic renal insufficiency. - She does not want colonoscopy. I do agree.  She had been scheduled for colonoscopy previously but canceled.  Of note that the patient is on Eliquis and very high risk for CVA. She is also highly allergic to IV contrast. - Continue Dexilant 60 mg p.o. once a day - Discussed in detail with the patient and patient's daughter. HPI:    Nichole Nelson is a 83 y.o. female  Seen at the request of Dr. Rica Records Found to have elevated amylase Denies having any upper GI symptoms.  No nausea, vomiting, epigastric pain, fever, chills, jaundice or dark urine. Lower abdominal pain-more or less constant, gets better with defecation With bloating Long-standing history of constipation and has been on MiraLAX every other day.  If she takes MiraLAX every day sometimes would result in diarrhea. Reflux under good control with Dexilant [facility]CT at [facility] - neg -ordered by Dr. Comer Locket. No melena or hematochezia-patient is on Eliquis Had recent TIA/CVA with slurred speech.  Told never to stop Eliquis. Has been scheduled for colonoscopy in the past but called and canceled. She does understand that there is small but definite chance of missing  colorectal neoplasms.  She also has history of  AAA -being followed by vascular surgery in [place].   Past Medical History:  Diagnosis Date  . COPD (chronic obstructive pulmonary disease) ([place])   . Hyperlipidemia   . Hypertension   . Thyroid disease     Past Surgical History:  Procedure Laterality Date  . ABDOMINAL HYSTERECTOMY    . AORTIC VALVE REPAIR    . APPENDECTOMY    . CORONARY ARTERY BYPASS GRAFT    . ESOPHAGOGASTRODUODENOSCOPY  01/26/2014   Moderate hiatal hernia. presbyesophagus. No evidence of upper GI bleeding.   Marland Kitchen HERNIA REPAIR    . INSERT / REPLACE / REMOVE PACEMAKER     [employer]    Family History  Problem Relation Age of Onset  . Stroke Mother   . Kidney failure Mother   . Aneurysm Father   . Colon cancer Neg Hx     Social History   Tobacco Use  . Smoking status: Never Smoker  . Smokeless tobacco: Never Used  Substance Use Topics  . Alcohol use: No  . Drug use: No    Current Outpatient Medications  Medication Sig Dispense Refill  . apixaban (ELIQUIS) 2.5 MG TABS tablet Take 2.5 mg by mouth 2 (two) times daily.    Marland Kitchen CARTIA XT 120 MG 24 hr capsule Take 1 tablet by mouth daily.    . Cholecalciferol (VITAMIN D3) 2000 units capsule Take 1 capsule by mouth daily.    . clonazePAM (KLONOPIN) 0.5 MG tablet Take  0.5 mg by mouth 2 (two) times daily.    Marland Kitchen dexlansoprazole (DEXILANT) 60 MG capsule Take 60 mg by mouth daily.    Marland Kitchen escitalopram (LEXAPRO) 10 MG tablet Take 10 mg by mouth daily.    . ferrous sulfate 325 (65 FE) MG tablet Take 325 mg by mouth daily.    . fluticasone furoate-vilanterol (BREO ELLIPTA) 100-25 MCG/INH AEPB Inhale 1 puff into the lungs daily.    Marland Kitchen levothyroxine (SYNTHROID, LEVOTHROID) 88 MCG tablet Take 1 tablet by mouth daily.    . mometasone (NASONEX) 50 MCG/ACT nasal spray Place 1 spray into both nostrils daily.    . montelukast (SINGULAIR) 10 MG tablet Take 10 mg by mouth daily.    . Omega-3 Fatty Acids (FISH OIL) 1000 MG CAPS  Take 2,000 mg by mouth 2 (two) times daily.    . ondansetron (ZOFRAN) 4 MG tablet Take 4 mg by mouth every 8 (eight) hours as needed for nausea/vomiting.    . pravastatin (PRAVACHOL) 80 MG tablet Take 80 mg by mouth daily.    . traMADol (ULTRAM) 50 MG tablet Take 100 mg by mouth every 8 (eight) hours as needed for pain.    Marland Kitchen gabapentin (NEURONTIN) 100 MG capsule Take 100 mg by mouth 4 (four) times daily.     No current facility-administered medications for this visit.     Allergies  Allergen Reactions  . Iodinated Diagnostic Agents Other (See Comments)    stroke  . Antihistamines, Chlorpheniramine-Type Palpitations  . Meperidine Nausea And Vomiting    Review of Systems:  neg except for HPI     Physical Exam:    BP 132/74   Pulse 76   Ht 5' (1.524 m)   SpO2 (!) 76%   BMI 22.89 kg/m  There were no vitals filed for this visit. Constitutional:  Well-developed, in no acute distress.  Patient on wheelchair. Psychiatric: Normal mood and affect. Behavior is normal. HEENT: Pupils normal.  Conjunctivae are normal. No scleral icterus. Neck supple.  Cardiovascular: Normal rate, regular rhythm. No edema Pulmonary/chest: Effort normal and breath sounds normal. No wheezing, rales or rhonchi. Abdominal: Soft, nondistended. Nontender. Bowel sounds active throughout. There are no masses palpable. No hepatomegaly. Rectal:  defered Neurological: Alert and oriented to person place and time. Skin: Skin is warm and dry. No rashes noted.  Data Reviewed: I have personally reviewed following labs and imaging studies  CBC: No flowsheet data found.  CMP: CMP Latest Ref Rng & Units 05/21/2017  Glucose 65 - 99 mg/dL 113(H)  BUN 8 - 27 mg/dL 17  Creatinine 0.57 - 1.00 mg/dL 1.15(H)  Sodium 134 - 144 mmol/L 138  Potassium 3.5 - 5.2 mmol/L 5.2  Chloride 96 - 106 mmol/L 100  CO2 20 - 29 mmol/L 24  Calcium 8.7 - 10.3 mg/dL 9.6  25 minutes spent with the patient today. Greater than 50% was spent  in counseling and coordination of care with the patient    Carmell Austria, MD 09/27/2018, 11:19 AM  Cc: Nicholos Johns, MD

## 2018-09-27 NOTE — Patient Instructions (Signed)
If you are age 81 or older, your body mass index should be between 23-30. Your Body mass index is 22.89 kg/m. If this is out of the aforementioned range listed, please consider follow up with your Primary Care Provider.  If you are age 77 or younger, your body mass index should be between 19-25. Your Body mass index is 22.89 kg/m. If this is out of the aformentioned range listed, please consider follow up with your Primary Care Provider.   Please go to the lab on the 2nd floor [address] before you leave the office today.   Thank you,  Dr. Jackquline Denmark

## 2018-09-29 ENCOUNTER — Encounter: Payer: Medicare HMO | Admitting: Gastroenterology

## 2018-10-01 DIAGNOSIS — N3001 Acute cystitis with hematuria: Secondary | ICD-10-CM | POA: Diagnosis not present

## 2018-10-01 DIAGNOSIS — R300 Dysuria: Secondary | ICD-10-CM | POA: Diagnosis not present

## 2018-10-04 ENCOUNTER — Encounter: Payer: Medicare HMO | Admitting: Cardiology

## 2018-10-11 ENCOUNTER — Ambulatory Visit (INDEPENDENT_AMBULATORY_CARE_PROVIDER_SITE_OTHER): Payer: Medicare HMO | Admitting: Cardiology

## 2018-10-11 ENCOUNTER — Encounter: Payer: Self-pay | Admitting: Cardiology

## 2018-10-11 VITALS — BP 110/62 | HR 70 | Ht 60.0 in | Wt 114.0 lb

## 2018-10-11 DIAGNOSIS — Z950 Presence of cardiac pacemaker: Secondary | ICD-10-CM | POA: Diagnosis not present

## 2018-10-11 DIAGNOSIS — E782 Mixed hyperlipidemia: Secondary | ICD-10-CM

## 2018-10-11 DIAGNOSIS — I4821 Permanent atrial fibrillation: Secondary | ICD-10-CM

## 2018-10-11 DIAGNOSIS — I6522 Occlusion and stenosis of left carotid artery: Secondary | ICD-10-CM

## 2018-10-11 DIAGNOSIS — Z9889 Other specified postprocedural states: Secondary | ICD-10-CM | POA: Diagnosis not present

## 2018-10-11 NOTE — Progress Notes (Signed)
Cardiology Office Note:    Date:  10/11/2018   ID:  Nichole Nelson, DOB 03-Jan-1935, MRN [id number]  PCP:  Nicholos Johns, MD  Cardiologist:  Jenne Campus, MD    Referring MD: Nicholos Johns, MD   No chief complaint on file. Doing well  History of Present Illness:    Nichole Nelson is a 83 y.o. female with chronic atrial fibrillation, status post AV nodal ablation, status post pacemaker.  Hypertension carotid arterial stenosis.  Comes today to office follow-up doing well denies have any chest pain tightness squeezing pressure been chest.  She is very sedentary I advised her to be a little more active.  Past Medical History:  Diagnosis Date  . COPD (chronic obstructive pulmonary disease) ([place])   . Hyperlipidemia   . Hypertension   . Thyroid disease     Past Surgical History:  Procedure Laterality Date  . ABDOMINAL HYSTERECTOMY    . AORTIC VALVE REPAIR    . APPENDECTOMY    . CORONARY ARTERY BYPASS GRAFT    . ESOPHAGOGASTRODUODENOSCOPY  01/26/2014   Moderate hiatal hernia. presbyesophagus. No evidence of upper GI bleeding.   Marland Kitchen HERNIA REPAIR    . INSERT / REPLACE / REMOVE PACEMAKER     Medtronic    Current Medications: No outpatient medications have been marked as taking for the 09/15/18 encounter (Clinical Support) with Wanda Plump, RN.     Allergies:   Iodinated diagnostic agents; Antihistamines, chlorpheniramine-type; and Meperidine   Social History   Socioeconomic History  . Marital status: Widowed    Spouse name: Not on file  . Number of children: Not on file  . Years of education: Not on file  . Highest education level: Not on file  Occupational History  . Not on file  Social Needs  . Financial resource strain: Not on file  . Food insecurity:    Worry: Not on file    Inability: Not on file  . Transportation needs:    Medical: Not on file    Non-medical: Not on file  Tobacco Use  . Smoking status: Never Smoker  . Smokeless tobacco:  Never Used  Substance and Sexual Activity  . Alcohol use: No  . Drug use: No  . Sexual activity: Not on file  Lifestyle  . Physical activity:    Days per week: Not on file    Minutes per session: Not on file  . Stress: Not on file  Relationships  . Social connections:    Talks on phone: Not on file    Gets together: Not on file    Attends religious service: Not on file    Active member of club or organization: Not on file    Attends meetings of clubs or organizations: Not on file    Relationship status: Not on file  Other Topics Concern  . Not on file  Social History Narrative  . Not on file     Family History: The patient's family history includes Aneurysm in her father; Kidney failure in her mother; Stroke in her mother. There is no history of Colon cancer. ROS:   Please see the history of present illness.    All 14 point review of systems negative except as described per history of present illness  EKGs/Labs/Other Studies Reviewed:      Recent Labs: 09/27/2018: ALT 15; BUN 19; Creatinine, Ser 0.99; Hemoglobin 12.3; Platelets 212.0; Potassium 5.1; Sodium 140  Recent Lipid Panel No results found for: CHOL, TRIG,  HDL, CHOLHDL, VLDL, LDLCALC, LDLDIRECT  Physical Exam:    VS:  There were no vitals taken for this visit.    Wt Readings from Last 3 Encounters:  10/11/18 114 lb (51.7 kg)  01/22/18 117 lb 3.2 oz (53.2 kg)  08/26/17 119 lb 1.9 oz (54 kg)     GEN:  Well nourished, well developed in no acute distress HEENT: Normal NECK: No JVD; No carotid bruits LYMPHATICS: No lymphadenopathy CARDIAC: RRR, no murmurs, no rubs, no gallops RESPIRATORY:  Clear to auscultation without rales, wheezing or rhonchi  ABDOMEN: Soft, non-tender, non-distended MUSCULOSKELETAL:  No edema; No deformity  SKIN: Warm and dry LOWER EXTREMITIES: no swelling NEUROLOGIC:  Alert and oriented x 3 PSYCHIATRIC:  Normal affect   ASSESSMENT:    1. AV block, 3rd degree (HCC)   2. Permanent  atrial fibrillation   3. Stenosis of left carotid artery   4. Essential hypertension   5. Pacemaker   6. S/P AV nodal ablation    PLAN:    In order of problems listed above:  1. Chronic atrial fibrillation rate appears to be controlled.  AV nodal ablation, anticoagulated which I will continue. 2. Carotid arterial stenosis time to recheck cardiac ultrasounds which I will schedule him to have. 3. Essential hypertension blood pressure well controlled. 4. Pacemaker present follow-up by our EP team.   Medication Adjustments/Labs and Tests Ordered: Current medicines are reviewed at length with the patient today.  Concerns regarding medicines are outlined above.  No orders of the defined types were placed in this encounter.  Medication changes: No orders of the defined types were placed in this encounter.   Signed, Park Liter, MD, [facility] 10/11/2018 9:45 AM    [place]

## 2018-10-11 NOTE — Patient Instructions (Signed)
Medication Instructions:  Your physician recommends that you continue on your current medications as directed. Please refer to the Current Medication list given to you today.  If you need a refill on your cardiac medications before your next appointment, please call your pharmacy.   Lab work: None ordered If you have labs (blood work) drawn today and your tests are completely normal, you will receive your results only by: Marland Kitchen MyChart Message (if you have MyChart) OR . A paper copy in the mail If you have any lab test that is abnormal or we need to change your treatment, we will call you to review the results.  Testing/Procedures: Your physician has requested that you have a carotid duplex. This test is an ultrasound of the carotid arteries in your neck. It looks at blood flow through these arteries that supply the brain with blood. Allow one hour for this exam. There are no restrictions or special instructions.  Follow-Up: At [facility], you and your health needs are our priority.  As part of our continuing mission to provide you with exceptional heart care, we have created designated Provider Care Teams.  These Care Teams include your primary Cardiologist (physician) and Advanced Practice Providers (APPs -  Physician Assistants and Nurse Practitioners) who all work together to provide you with the care you need, when you need it. You will need a follow up appointment in 6 months.  Please call our office 2 months in advance to schedule this appointment.  You may see  Jenne Campus or another member of our [employer] Provider Team in [place]: Shirlee More, MD . Jyl Heinz, MD  Any Other Special Instructions Will Be Listed Below (If Applicable).

## 2018-10-12 DIAGNOSIS — N3281 Overactive bladder: Secondary | ICD-10-CM | POA: Diagnosis not present

## 2018-10-13 DIAGNOSIS — E782 Mixed hyperlipidemia: Secondary | ICD-10-CM | POA: Diagnosis not present

## 2018-10-13 DIAGNOSIS — N390 Urinary tract infection, site not specified: Secondary | ICD-10-CM | POA: Diagnosis not present

## 2018-10-13 DIAGNOSIS — K219 Gastro-esophageal reflux disease without esophagitis: Secondary | ICD-10-CM | POA: Diagnosis not present

## 2018-10-13 DIAGNOSIS — Z6822 Body mass index (BMI) 22.0-22.9, adult: Secondary | ICD-10-CM | POA: Diagnosis not present

## 2018-10-13 DIAGNOSIS — M545 Low back pain: Secondary | ICD-10-CM | POA: Diagnosis not present

## 2018-10-13 DIAGNOSIS — I714 Abdominal aortic aneurysm, without rupture: Secondary | ICD-10-CM | POA: Diagnosis not present

## 2018-10-13 DIAGNOSIS — Z79899 Other long term (current) drug therapy: Secondary | ICD-10-CM | POA: Diagnosis not present

## 2018-10-13 DIAGNOSIS — E559 Vitamin D deficiency, unspecified: Secondary | ICD-10-CM | POA: Diagnosis not present

## 2018-10-13 NOTE — Progress Notes (Signed)
Cardiology Office Note:    Date:  10/13/2018   ID:  Nichole Nelson, DOB 05-09-1935, MRN [id number]  PCP:  Nicholos Johns, MD  Cardiologist:  Jenne Campus, MD    Referring MD: Nicholos Johns, MD   Chief Complaint  Patient presents with  . Hypertension  Doing well  History of Present Illness:    Nichole Nelson is a 83 y.o. female with permanent atrial fibrillation, pacemaker, status post AV nodal ablation, dyslipidemia comes today to office for follow-up overall doing fine denies have any chest pain tightness squeezing pressure burning chest.  Past Medical History:  Diagnosis Date  . COPD (chronic obstructive pulmonary disease) ([place])   . Hyperlipidemia   . Hypertension   . Thyroid disease     Past Surgical History:  Procedure Laterality Date  . ABDOMINAL HYSTERECTOMY    . AORTIC VALVE REPAIR    . APPENDECTOMY    . CORONARY ARTERY BYPASS GRAFT    . ESOPHAGOGASTRODUODENOSCOPY  01/26/2014   Moderate hiatal hernia. presbyesophagus. No evidence of upper GI bleeding.   Marland Kitchen HERNIA REPAIR    . INSERT / REPLACE / REMOVE PACEMAKER     Medtronic    Current Medications: Current Meds  Medication Sig  . apixaban (ELIQUIS) 2.5 MG TABS tablet Take 2.5 mg by mouth 2 (two) times daily.  Marland Kitchen CARTIA XT 120 MG 24 hr capsule Take 1 tablet by mouth daily.  . Cholecalciferol (VITAMIN D3) 2000 units capsule Take 1 capsule by mouth daily.  . clonazePAM (KLONOPIN) 0.5 MG tablet Take 0.5 mg by mouth 2 (two) times daily.  Marland Kitchen dexlansoprazole (DEXILANT) 60 MG capsule Take 60 mg by mouth daily.  Marland Kitchen escitalopram (LEXAPRO) 10 MG tablet Take 10 mg by mouth daily.  . ferrous sulfate 325 (65 FE) MG tablet Take 325 mg by mouth daily.  . fluticasone furoate-vilanterol (BREO ELLIPTA) 100-25 MCG/INH AEPB Inhale 1 puff into the lungs daily.  Marland Kitchen levothyroxine (SYNTHROID, LEVOTHROID) 88 MCG tablet Take 1 tablet by mouth daily.  . mometasone (NASONEX) 50 MCG/ACT nasal spray Place 1 spray into both  nostrils daily.  . montelukast (SINGULAIR) 10 MG tablet Take 10 mg by mouth daily.  . Omega-3 Fatty Acids (FISH OIL) 1000 MG CAPS Take 2,000 mg by mouth 2 (two) times daily.  . ondansetron (ZOFRAN) 4 MG tablet Take 4 mg by mouth every 8 (eight) hours as needed for nausea/vomiting.  . pravastatin (PRAVACHOL) 80 MG tablet Take 80 mg by mouth daily.  . traMADol (ULTRAM) 50 MG tablet Take 100 mg by mouth every 8 (eight) hours as needed for pain.     Allergies:   Iodinated diagnostic agents; Antihistamines, chlorpheniramine-type; and Meperidine   Social History   Socioeconomic History  . Marital status: Widowed    Spouse name: Not on file  . Number of children: Not on file  . Years of education: Not on file  . Highest education level: Not on file  Occupational History  . Not on file  Social Needs  . Financial resource strain: Not on file  . Food insecurity:    Worry: Not on file    Inability: Not on file  . Transportation needs:    Medical: Not on file    Non-medical: Not on file  Tobacco Use  . Smoking status: Never Smoker  . Smokeless tobacco: Never Used  Substance and Sexual Activity  . Alcohol use: No  . Drug use: No  . Sexual activity: Not on file  Lifestyle  .  Physical activity:    Days per week: Not on file    Minutes per session: Not on file  . Stress: Not on file  Relationships  . Social connections:    Talks on phone: Not on file    Gets together: Not on file    Attends religious service: Not on file    Active member of club or organization: Not on file    Attends meetings of clubs or organizations: Not on file    Relationship status: Not on file  Other Topics Concern  . Not on file  Social History Narrative  . Not on file     Family History: The patient's family history includes Aneurysm in her father; Kidney failure in her mother; Stroke in her mother. There is no history of Colon cancer. ROS:   Please see the history of present illness.    All 14  point review of systems negative except as described per history of present illness  EKGs/Labs/Other Studies Reviewed:      Recent Labs: 09/27/2018: ALT 15; BUN 19; Creatinine, Ser 0.99; Hemoglobin 12.3; Platelets 212.0; Potassium 5.1; Sodium 140  Recent Lipid Panel No results found for: CHOL, TRIG, HDL, CHOLHDL, VLDL, LDLCALC, LDLDIRECT  Physical Exam:    VS:  BP 110/62   Pulse 70   Ht 5' (1.524 m)   Wt 114 lb (51.7 kg)   SpO2 98%   BMI 22.26 kg/m     Wt Readings from Last 3 Encounters:  10/11/18 114 lb (51.7 kg)  01/22/18 117 lb 3.2 oz (53.2 kg)  08/26/17 119 lb 1.9 oz (54 kg)     GEN:  Well nourished, well developed in no acute distress HEENT: Normal NECK: No JVD; No carotid bruits LYMPHATICS: No lymphadenopathy CARDIAC: RRR, no murmurs, no rubs, no gallops RESPIRATORY:  Clear to auscultation without rales, wheezing or rhonchi  ABDOMEN: Soft, non-tender, non-distended MUSCULOSKELETAL:  No edema; No deformity  SKIN: Warm and dry LOWER EXTREMITIES: no swelling NEUROLOGIC:  Alert and oriented x 3 PSYCHIATRIC:  Normal affect   ASSESSMENT:    1. Stenosis of left carotid artery   2. Permanent atrial fibrillation   3. Pacemaker   4. S/P AV nodal ablation   5. Mixed hyperlipidemia    PLAN:    In order of problems listed above:  1. Cardiac arterial stenosis not critical we will continue present management. 2. Permanent atrial fibrillation anticoagulated which I will continue.  Rate controlled. 3. Pacemaker will make arrangements for interrogation of the device. 4. Status post AV nodal ablation.  Noted. 5. Mixed dyslipidemia on statin which I will continue.   Medication Adjustments/Labs and Tests Ordered: Current medicines are reviewed at length with the patient today.  Concerns regarding medicines are outlined above.  No orders of the defined types were placed in this encounter.  Medication changes: No orders of the defined types were placed in this  encounter.   Signed, Park Liter, MD, [facility] 10/13/2018 9:50 AM    [place]

## 2018-10-14 DIAGNOSIS — Z7409 Other reduced mobility: Secondary | ICD-10-CM | POA: Diagnosis not present

## 2018-10-30 ENCOUNTER — Encounter: Payer: Self-pay | Admitting: Gastroenterology

## 2018-11-01 DIAGNOSIS — J449 Chronic obstructive pulmonary disease, unspecified: Secondary | ICD-10-CM | POA: Diagnosis not present

## 2018-11-10 LAB — CUP PACEART REMOTE DEVICE CHECK
Battery Remaining Longevity: 42 mo
Battery Voltage: 2.97 V
Brady Statistic AP VP Percent: 0 %
Brady Statistic AP VS Percent: 0 %
Brady Statistic AS VP Percent: 99.91 %
Brady Statistic AS VS Percent: 0.09 %
Brady Statistic RA Percent Paced: 0 %
Brady Statistic RV Percent Paced: 99.94 %
Date Time Interrogation Session: 20191121003247
Implantable Lead Implant Date: 20150916
Implantable Lead Implant Date: 20150916
Implantable Lead Implant Date: 20150916
Implantable Lead Location: 753860
Implantable Lead Location: 753860
Implantable Lead Model: 5076
Implantable Lead Model: 5076
Implantable Lead Model: 5076
Implantable Pulse Generator Implant Date: 20150916
Lead Channel Impedance Value: 304 Ohm
Lead Channel Impedance Value: 399 Ohm
Lead Channel Impedance Value: 418 Ohm
Lead Channel Impedance Value: 437 Ohm
Lead Channel Impedance Value: 646 Ohm
Lead Channel Impedance Value: 646 Ohm
Lead Channel Pacing Threshold Amplitude: 0.625 V
Lead Channel Pacing Threshold Amplitude: 0.625 V
Lead Channel Pacing Threshold Amplitude: 0.75 V
Lead Channel Pacing Threshold Pulse Width: 0.4 ms
Lead Channel Pacing Threshold Pulse Width: 0.4 ms
Lead Channel Pacing Threshold Pulse Width: 0.4 ms
Lead Channel Sensing Intrinsic Amplitude: 3.75 mV
Lead Channel Sensing Intrinsic Amplitude: 3.75 mV
Lead Channel Sensing Intrinsic Amplitude: 5.25 mV
Lead Channel Sensing Intrinsic Amplitude: 5.25 mV
Lead Channel Setting Pacing Amplitude: 2 V
Lead Channel Setting Pacing Amplitude: 2.5 V
Lead Channel Setting Pacing Pulse Width: 0.4 ms
Lead Channel Setting Pacing Pulse Width: 0.4 ms
Lead Channel Setting Sensing Sensitivity: 4 mV
MDC IDC LEAD LOCATION: 753859
MDC IDC MSMT LEADCHNL LV IMPEDANCE VALUE: 266 Ohm
MDC IDC MSMT LEADCHNL LV IMPEDANCE VALUE: 703 Ohm
MDC IDC MSMT LEADCHNL RA IMPEDANCE VALUE: 323 Ohm

## 2018-11-11 DIAGNOSIS — R110 Nausea: Secondary | ICD-10-CM | POA: Diagnosis not present

## 2018-11-11 DIAGNOSIS — E039 Hypothyroidism, unspecified: Secondary | ICD-10-CM | POA: Diagnosis not present

## 2018-11-11 DIAGNOSIS — M545 Low back pain: Secondary | ICD-10-CM | POA: Diagnosis not present

## 2018-11-11 DIAGNOSIS — Z6822 Body mass index (BMI) 22.0-22.9, adult: Secondary | ICD-10-CM | POA: Diagnosis not present

## 2018-11-11 DIAGNOSIS — I10 Essential (primary) hypertension: Secondary | ICD-10-CM | POA: Diagnosis not present

## 2018-11-11 DIAGNOSIS — R262 Difficulty in walking, not elsewhere classified: Secondary | ICD-10-CM | POA: Diagnosis not present

## 2018-11-12 DIAGNOSIS — M19011 Primary osteoarthritis, right shoulder: Secondary | ICD-10-CM | POA: Diagnosis not present

## 2018-11-12 DIAGNOSIS — I4891 Unspecified atrial fibrillation: Secondary | ICD-10-CM | POA: Diagnosis not present

## 2018-11-12 DIAGNOSIS — J449 Chronic obstructive pulmonary disease, unspecified: Secondary | ICD-10-CM | POA: Diagnosis not present

## 2018-11-12 DIAGNOSIS — I714 Abdominal aortic aneurysm, without rupture: Secondary | ICD-10-CM | POA: Diagnosis not present

## 2018-11-12 DIAGNOSIS — I10 Essential (primary) hypertension: Secondary | ICD-10-CM | POA: Diagnosis not present

## 2018-11-12 DIAGNOSIS — M545 Low back pain: Secondary | ICD-10-CM | POA: Diagnosis not present

## 2018-11-12 DIAGNOSIS — N3281 Overactive bladder: Secondary | ICD-10-CM | POA: Diagnosis not present

## 2018-11-12 DIAGNOSIS — I2510 Atherosclerotic heart disease of native coronary artery without angina pectoris: Secondary | ICD-10-CM | POA: Diagnosis not present

## 2018-11-12 DIAGNOSIS — G250 Essential tremor: Secondary | ICD-10-CM | POA: Diagnosis not present

## 2018-11-12 DIAGNOSIS — G2581 Restless legs syndrome: Secondary | ICD-10-CM | POA: Diagnosis not present

## 2018-11-14 DIAGNOSIS — Z7409 Other reduced mobility: Secondary | ICD-10-CM | POA: Diagnosis not present

## 2018-11-23 DIAGNOSIS — M19011 Primary osteoarthritis, right shoulder: Secondary | ICD-10-CM | POA: Diagnosis not present

## 2018-11-23 DIAGNOSIS — I4891 Unspecified atrial fibrillation: Secondary | ICD-10-CM | POA: Diagnosis not present

## 2018-11-23 DIAGNOSIS — I2510 Atherosclerotic heart disease of native coronary artery without angina pectoris: Secondary | ICD-10-CM | POA: Diagnosis not present

## 2018-11-23 DIAGNOSIS — J449 Chronic obstructive pulmonary disease, unspecified: Secondary | ICD-10-CM | POA: Diagnosis not present

## 2018-11-23 DIAGNOSIS — G2581 Restless legs syndrome: Secondary | ICD-10-CM | POA: Diagnosis not present

## 2018-11-23 DIAGNOSIS — I10 Essential (primary) hypertension: Secondary | ICD-10-CM | POA: Diagnosis not present

## 2018-11-23 DIAGNOSIS — I714 Abdominal aortic aneurysm, without rupture: Secondary | ICD-10-CM | POA: Diagnosis not present

## 2018-11-23 DIAGNOSIS — G250 Essential tremor: Secondary | ICD-10-CM | POA: Diagnosis not present

## 2018-11-23 DIAGNOSIS — M545 Low back pain: Secondary | ICD-10-CM | POA: Diagnosis not present

## 2018-11-26 DIAGNOSIS — I714 Abdominal aortic aneurysm, without rupture: Secondary | ICD-10-CM | POA: Diagnosis not present

## 2018-11-26 DIAGNOSIS — M19011 Primary osteoarthritis, right shoulder: Secondary | ICD-10-CM | POA: Diagnosis not present

## 2018-11-26 DIAGNOSIS — M545 Low back pain: Secondary | ICD-10-CM | POA: Diagnosis not present

## 2018-11-26 DIAGNOSIS — G2581 Restless legs syndrome: Secondary | ICD-10-CM | POA: Diagnosis not present

## 2018-11-26 DIAGNOSIS — I4891 Unspecified atrial fibrillation: Secondary | ICD-10-CM | POA: Diagnosis not present

## 2018-11-26 DIAGNOSIS — J449 Chronic obstructive pulmonary disease, unspecified: Secondary | ICD-10-CM | POA: Diagnosis not present

## 2018-11-26 DIAGNOSIS — I10 Essential (primary) hypertension: Secondary | ICD-10-CM | POA: Diagnosis not present

## 2018-11-26 DIAGNOSIS — I2510 Atherosclerotic heart disease of native coronary artery without angina pectoris: Secondary | ICD-10-CM | POA: Diagnosis not present

## 2018-11-26 DIAGNOSIS — G250 Essential tremor: Secondary | ICD-10-CM | POA: Diagnosis not present

## 2018-11-29 ENCOUNTER — Encounter: Payer: Medicare HMO | Admitting: Cardiology

## 2018-12-01 DIAGNOSIS — G250 Essential tremor: Secondary | ICD-10-CM | POA: Diagnosis not present

## 2018-12-01 DIAGNOSIS — I714 Abdominal aortic aneurysm, without rupture: Secondary | ICD-10-CM | POA: Diagnosis not present

## 2018-12-01 DIAGNOSIS — M545 Low back pain: Secondary | ICD-10-CM | POA: Diagnosis not present

## 2018-12-01 DIAGNOSIS — I10 Essential (primary) hypertension: Secondary | ICD-10-CM | POA: Diagnosis not present

## 2018-12-01 DIAGNOSIS — I2510 Atherosclerotic heart disease of native coronary artery without angina pectoris: Secondary | ICD-10-CM | POA: Diagnosis not present

## 2018-12-01 DIAGNOSIS — G2581 Restless legs syndrome: Secondary | ICD-10-CM | POA: Diagnosis not present

## 2018-12-01 DIAGNOSIS — I4891 Unspecified atrial fibrillation: Secondary | ICD-10-CM | POA: Diagnosis not present

## 2018-12-01 DIAGNOSIS — M19011 Primary osteoarthritis, right shoulder: Secondary | ICD-10-CM | POA: Diagnosis not present

## 2018-12-01 DIAGNOSIS — J449 Chronic obstructive pulmonary disease, unspecified: Secondary | ICD-10-CM | POA: Diagnosis not present

## 2018-12-03 DIAGNOSIS — I714 Abdominal aortic aneurysm, without rupture: Secondary | ICD-10-CM | POA: Diagnosis not present

## 2018-12-03 DIAGNOSIS — M545 Low back pain: Secondary | ICD-10-CM | POA: Diagnosis not present

## 2018-12-03 DIAGNOSIS — I4891 Unspecified atrial fibrillation: Secondary | ICD-10-CM | POA: Diagnosis not present

## 2018-12-03 DIAGNOSIS — G250 Essential tremor: Secondary | ICD-10-CM | POA: Diagnosis not present

## 2018-12-03 DIAGNOSIS — I2510 Atherosclerotic heart disease of native coronary artery without angina pectoris: Secondary | ICD-10-CM | POA: Diagnosis not present

## 2018-12-03 DIAGNOSIS — J449 Chronic obstructive pulmonary disease, unspecified: Secondary | ICD-10-CM | POA: Diagnosis not present

## 2018-12-03 DIAGNOSIS — M19011 Primary osteoarthritis, right shoulder: Secondary | ICD-10-CM | POA: Diagnosis not present

## 2018-12-03 DIAGNOSIS — G2581 Restless legs syndrome: Secondary | ICD-10-CM | POA: Diagnosis not present

## 2018-12-03 DIAGNOSIS — I10 Essential (primary) hypertension: Secondary | ICD-10-CM | POA: Diagnosis not present

## 2018-12-09 DIAGNOSIS — G2581 Restless legs syndrome: Secondary | ICD-10-CM | POA: Diagnosis not present

## 2018-12-09 DIAGNOSIS — I4891 Unspecified atrial fibrillation: Secondary | ICD-10-CM | POA: Diagnosis not present

## 2018-12-09 DIAGNOSIS — M545 Low back pain: Secondary | ICD-10-CM | POA: Diagnosis not present

## 2018-12-09 DIAGNOSIS — J449 Chronic obstructive pulmonary disease, unspecified: Secondary | ICD-10-CM | POA: Diagnosis not present

## 2018-12-09 DIAGNOSIS — I10 Essential (primary) hypertension: Secondary | ICD-10-CM | POA: Diagnosis not present

## 2018-12-09 DIAGNOSIS — I2510 Atherosclerotic heart disease of native coronary artery without angina pectoris: Secondary | ICD-10-CM | POA: Diagnosis not present

## 2018-12-09 DIAGNOSIS — M19011 Primary osteoarthritis, right shoulder: Secondary | ICD-10-CM | POA: Diagnosis not present

## 2018-12-09 DIAGNOSIS — I714 Abdominal aortic aneurysm, without rupture: Secondary | ICD-10-CM | POA: Diagnosis not present

## 2018-12-09 DIAGNOSIS — G250 Essential tremor: Secondary | ICD-10-CM | POA: Diagnosis not present

## 2018-12-14 DIAGNOSIS — N3281 Overactive bladder: Secondary | ICD-10-CM | POA: Diagnosis not present

## 2018-12-15 ENCOUNTER — Ambulatory Visit (INDEPENDENT_AMBULATORY_CARE_PROVIDER_SITE_OTHER): Payer: Medicare HMO

## 2018-12-15 DIAGNOSIS — I442 Atrioventricular block, complete: Secondary | ICD-10-CM

## 2018-12-15 DIAGNOSIS — Z7409 Other reduced mobility: Secondary | ICD-10-CM | POA: Diagnosis not present

## 2018-12-15 DIAGNOSIS — I4821 Permanent atrial fibrillation: Secondary | ICD-10-CM

## 2018-12-16 ENCOUNTER — Telehealth: Payer: Self-pay

## 2018-12-16 NOTE — Telephone Encounter (Signed)
Spoke with patient to remind of missed remote transmission 

## 2018-12-17 DIAGNOSIS — S22060A Wedge compression fracture of T7-T8 vertebra, initial encounter for closed fracture: Secondary | ICD-10-CM | POA: Diagnosis not present

## 2018-12-17 DIAGNOSIS — X58XXXA Exposure to other specified factors, initial encounter: Secondary | ICD-10-CM | POA: Diagnosis not present

## 2018-12-17 DIAGNOSIS — M546 Pain in thoracic spine: Secondary | ICD-10-CM | POA: Diagnosis not present

## 2018-12-17 DIAGNOSIS — R32 Unspecified urinary incontinence: Secondary | ICD-10-CM | POA: Diagnosis not present

## 2018-12-17 DIAGNOSIS — J90 Pleural effusion, not elsewhere classified: Secondary | ICD-10-CM | POA: Diagnosis not present

## 2018-12-17 DIAGNOSIS — M545 Low back pain: Secondary | ICD-10-CM | POA: Diagnosis not present

## 2018-12-17 DIAGNOSIS — R52 Pain, unspecified: Secondary | ICD-10-CM | POA: Diagnosis not present

## 2018-12-17 DIAGNOSIS — R5381 Other malaise: Secondary | ICD-10-CM | POA: Diagnosis not present

## 2018-12-17 DIAGNOSIS — Z01810 Encounter for preprocedural cardiovascular examination: Secondary | ICD-10-CM | POA: Diagnosis not present

## 2018-12-17 DIAGNOSIS — R0781 Pleurodynia: Secondary | ICD-10-CM | POA: Diagnosis not present

## 2018-12-17 DIAGNOSIS — Y998 Other external cause status: Secondary | ICD-10-CM | POA: Diagnosis not present

## 2018-12-17 LAB — CUP PACEART REMOTE DEVICE CHECK
Battery Remaining Longevity: 40 mo
Battery Voltage: 2.96 V
Brady Statistic AP VP Percent: 0 %
Brady Statistic AP VS Percent: 0 %
Brady Statistic AS VP Percent: 99.71 %
Brady Statistic AS VS Percent: 0.29 %
Brady Statistic RA Percent Paced: 0 %
Brady Statistic RV Percent Paced: 99.85 %
Date Time Interrogation Session: 20200221042327
Implantable Lead Implant Date: 20150916
Implantable Lead Implant Date: 20150916
Implantable Lead Implant Date: 20150916
Implantable Lead Location: 753859
Implantable Lead Location: 753860
Implantable Lead Location: 753860
Implantable Lead Model: 5076
Implantable Lead Model: 5076
Implantable Lead Model: 5076
Implantable Pulse Generator Implant Date: 20150916
Lead Channel Impedance Value: 247 Ohm
Lead Channel Impedance Value: 285 Ohm
Lead Channel Impedance Value: 323 Ohm
Lead Channel Impedance Value: 399 Ohm
Lead Channel Impedance Value: 437 Ohm
Lead Channel Impedance Value: 513 Ohm
Lead Channel Impedance Value: 532 Ohm
Lead Channel Impedance Value: 570 Ohm
Lead Channel Pacing Threshold Amplitude: 0.625 V
Lead Channel Pacing Threshold Amplitude: 0.75 V
Lead Channel Pacing Threshold Amplitude: 0.875 V
Lead Channel Pacing Threshold Pulse Width: 0.4 ms
Lead Channel Pacing Threshold Pulse Width: 0.4 ms
Lead Channel Pacing Threshold Pulse Width: 0.4 ms
Lead Channel Sensing Intrinsic Amplitude: 3.75 mV
Lead Channel Sensing Intrinsic Amplitude: 3.75 mV
Lead Channel Sensing Intrinsic Amplitude: 6.5 mV
Lead Channel Sensing Intrinsic Amplitude: 6.5 mV
Lead Channel Setting Pacing Amplitude: 2 V
Lead Channel Setting Pacing Amplitude: 2.5 V
Lead Channel Setting Pacing Pulse Width: 0.4 ms
Lead Channel Setting Pacing Pulse Width: 0.4 ms
MDC IDC MSMT LEADCHNL RA IMPEDANCE VALUE: 418 Ohm
MDC IDC SET LEADCHNL RV SENSING SENSITIVITY: 4 mV

## 2018-12-20 DIAGNOSIS — M549 Dorsalgia, unspecified: Secondary | ICD-10-CM | POA: Diagnosis not present

## 2018-12-20 DIAGNOSIS — Z6822 Body mass index (BMI) 22.0-22.9, adult: Secondary | ICD-10-CM | POA: Diagnosis not present

## 2018-12-20 DIAGNOSIS — Z09 Encounter for follow-up examination after completed treatment for conditions other than malignant neoplasm: Secondary | ICD-10-CM | POA: Diagnosis not present

## 2018-12-22 ENCOUNTER — Telehealth: Payer: Self-pay

## 2018-12-22 MED ORDER — FUROSEMIDE 20 MG PO TABS: 20.0000 mg | ORAL_TABLET | Freq: Every day | ORAL | 0 refills | Status: DC

## 2018-12-22 NOTE — Telephone Encounter (Signed)
Pt verbalized understanding to take lasix 20mg  a day for 4 days and will call in a few days if she has any problems.

## 2018-12-22 NOTE — Telephone Encounter (Signed)
-----   Message from [name], MD sent at 12/21/2018  9:55 AM EST ----- Abnormal device interrogation reviewed.  Lead parameters and battery status stable.  OptiVol elevated.  Needs 20 mg of Lasix daily for 4 days

## 2018-12-23 NOTE — Progress Notes (Signed)
Remote pacemaker transmission.   

## 2018-12-24 ENCOUNTER — Encounter: Payer: Self-pay | Admitting: Cardiology

## 2018-12-24 NOTE — Progress Notes (Signed)
Remote letter

## 2018-12-28 DIAGNOSIS — S22080A Wedge compression fracture of T11-T12 vertebra, initial encounter for closed fracture: Secondary | ICD-10-CM | POA: Diagnosis not present

## 2019-01-05 DIAGNOSIS — I2510 Atherosclerotic heart disease of native coronary artery without angina pectoris: Secondary | ICD-10-CM | POA: Diagnosis not present

## 2019-01-05 DIAGNOSIS — I4891 Unspecified atrial fibrillation: Secondary | ICD-10-CM | POA: Diagnosis not present

## 2019-01-05 DIAGNOSIS — J449 Chronic obstructive pulmonary disease, unspecified: Secondary | ICD-10-CM | POA: Diagnosis not present

## 2019-01-05 DIAGNOSIS — G2581 Restless legs syndrome: Secondary | ICD-10-CM | POA: Diagnosis not present

## 2019-01-05 DIAGNOSIS — I10 Essential (primary) hypertension: Secondary | ICD-10-CM | POA: Diagnosis not present

## 2019-01-05 DIAGNOSIS — M19011 Primary osteoarthritis, right shoulder: Secondary | ICD-10-CM | POA: Diagnosis not present

## 2019-01-05 DIAGNOSIS — I714 Abdominal aortic aneurysm, without rupture: Secondary | ICD-10-CM | POA: Diagnosis not present

## 2019-01-05 DIAGNOSIS — M545 Low back pain: Secondary | ICD-10-CM | POA: Diagnosis not present

## 2019-01-05 DIAGNOSIS — G250 Essential tremor: Secondary | ICD-10-CM | POA: Diagnosis not present

## 2019-01-10 DIAGNOSIS — J449 Chronic obstructive pulmonary disease, unspecified: Secondary | ICD-10-CM | POA: Diagnosis not present

## 2019-01-10 DIAGNOSIS — I252 Old myocardial infarction: Secondary | ICD-10-CM | POA: Diagnosis not present

## 2019-01-10 DIAGNOSIS — E782 Mixed hyperlipidemia: Secondary | ICD-10-CM | POA: Diagnosis not present

## 2019-01-10 DIAGNOSIS — F419 Anxiety disorder, unspecified: Secondary | ICD-10-CM | POA: Diagnosis not present

## 2019-01-10 DIAGNOSIS — I6522 Occlusion and stenosis of left carotid artery: Secondary | ICD-10-CM | POA: Diagnosis not present

## 2019-01-10 DIAGNOSIS — I4821 Permanent atrial fibrillation: Secondary | ICD-10-CM | POA: Diagnosis not present

## 2019-01-10 DIAGNOSIS — I10 Essential (primary) hypertension: Secondary | ICD-10-CM | POA: Diagnosis not present

## 2019-01-10 DIAGNOSIS — S22060A Wedge compression fracture of T7-T8 vertebra, initial encounter for closed fracture: Secondary | ICD-10-CM | POA: Diagnosis not present

## 2019-01-10 DIAGNOSIS — I2510 Atherosclerotic heart disease of native coronary artery without angina pectoris: Secondary | ICD-10-CM | POA: Diagnosis not present

## 2019-01-10 DIAGNOSIS — E079 Disorder of thyroid, unspecified: Secondary | ICD-10-CM | POA: Diagnosis not present

## 2019-01-11 DIAGNOSIS — N3281 Overactive bladder: Secondary | ICD-10-CM | POA: Diagnosis not present

## 2019-01-13 DIAGNOSIS — Z749 Problem related to care provider dependency, unspecified: Secondary | ICD-10-CM | POA: Diagnosis not present

## 2019-01-17 DIAGNOSIS — M549 Dorsalgia, unspecified: Secondary | ICD-10-CM | POA: Diagnosis not present

## 2019-01-17 DIAGNOSIS — J449 Chronic obstructive pulmonary disease, unspecified: Secondary | ICD-10-CM | POA: Diagnosis not present

## 2019-01-17 DIAGNOSIS — R262 Difficulty in walking, not elsewhere classified: Secondary | ICD-10-CM | POA: Diagnosis not present

## 2019-01-17 DIAGNOSIS — K219 Gastro-esophageal reflux disease without esophagitis: Secondary | ICD-10-CM | POA: Diagnosis not present

## 2019-01-17 DIAGNOSIS — I10 Essential (primary) hypertension: Secondary | ICD-10-CM | POA: Diagnosis not present

## 2019-01-27 ENCOUNTER — Telehealth: Payer: Self-pay | Admitting: *Deleted

## 2019-01-27 NOTE — Telephone Encounter (Signed)
Called patient to let them know due to recent [place] and Health Department Protocols, we are not seeing patients in the office. We are instead seeing if they would like to schedule this appointment as a Research scientist (medical) or Laptop. Unable to reach patient. No VM, phone hangs up after continuously ringing.    Marland Kitchen

## 2019-01-28 DIAGNOSIS — M549 Dorsalgia, unspecified: Secondary | ICD-10-CM | POA: Diagnosis not present

## 2019-01-28 DIAGNOSIS — N390 Urinary tract infection, site not specified: Secondary | ICD-10-CM | POA: Diagnosis not present

## 2019-02-01 NOTE — Telephone Encounter (Signed)
Called patient back. Patient currently only has a house phone line and does not have access to an [place], or computer/tablet.  She has no complaints. No swelling, no CP or chest discomfort, no SHOB, no fatigue.  Patient is already established with WC and will send in a transmission if she needs to.

## 2019-02-03 NOTE — Telephone Encounter (Signed)
Informed pt that we will do a telephone visit with her on 4/13.  Explained that she is overdue for follow up and it would be beneficial to at least have a verbal conversation w/ physician about how she is doing. Pt agreeable to plan and understands he will call her around 10 am Monday.

## 2019-02-07 ENCOUNTER — Telehealth (INDEPENDENT_AMBULATORY_CARE_PROVIDER_SITE_OTHER): Payer: Medicare HMO | Admitting: Cardiology

## 2019-02-07 ENCOUNTER — Other Ambulatory Visit: Payer: Self-pay

## 2019-02-07 ENCOUNTER — Telehealth: Payer: Self-pay | Admitting: *Deleted

## 2019-02-07 DIAGNOSIS — I4821 Permanent atrial fibrillation: Secondary | ICD-10-CM

## 2019-02-07 NOTE — Progress Notes (Signed)
Electrophysiology TeleHealth Note   Due to national recommendations of social distancing due to COVID 19, an audio/video telehealth visit is felt to be most appropriate for this patient at this time.  Consent for today's visit documented in epic.  Date:  02/07/2019   ID:  Nichole Nelson, DOB April 17, 1935, MRN [id number]  Location: patient's home  Provider location: [address] [place]  Evaluation Performed: Follow-up visit  PCP:  Nicholos Johns, MD  Cardiologist: Agustin Cree Electrophysiologist:  Dr Curt Bears  Chief Complaint: Pacemaker  History of Present Illness:    Nichole Nelson is a 84 y.o. female who presents via audio/video conferencing for a telehealth visit today.  Since last being seen in our clinic, the patient reports doing very well.  Today, she denies symptoms of palpitations, chest pain, shortness of breath,  lower extremity edema, dizziness, presyncope, or syncope.  The patient is otherwise without complaint today.  The patient denies symptoms of fevers, chills, cough, or new SOB worrisome for COVID 19.  Today, denies symptoms of palpitations, chest pain, shortness of breath, orthopnea, PND, lower extremity edema, claudication, dizziness, presyncope, syncope, bleeding, or neurologic sequela. The patient is tolerating medications without difficulties.  She has a history of permanent atrial fibrillation and is status post AV node ablation and Medtronic CRT-P.  She is feeling well.  She is able to do all of her daily activities.  She is not going out due to the coronavirus pandemic.  Otherwise she has no complaints.  Past Medical History:  Diagnosis Date  . COPD (chronic obstructive pulmonary disease) ([place])   . Hyperlipidemia   . Hypertension   . Thyroid disease     Past Surgical History:  Procedure Laterality Date  . ABDOMINAL HYSTERECTOMY    . AORTIC VALVE REPAIR    . APPENDECTOMY    . CORONARY ARTERY BYPASS GRAFT    .  ESOPHAGOGASTRODUODENOSCOPY  01/26/2014   Moderate hiatal hernia. presbyesophagus. No evidence of upper GI bleeding.   Marland Kitchen HERNIA REPAIR    . INSERT / REPLACE / REMOVE PACEMAKER     Medtronic    Current Outpatient Medications  Medication Sig Dispense Refill  . apixaban (ELIQUIS) 2.5 MG TABS tablet Take 2.5 mg by mouth 2 (two) times daily.    Marland Kitchen CARTIA XT 120 MG 24 hr capsule Take 1 tablet by mouth daily.    . Cholecalciferol (VITAMIN D3) 2000 units capsule Take 1 capsule by mouth daily.    . clonazePAM (KLONOPIN) 0.5 MG tablet Take 0.5 mg by mouth 2 (two) times daily.    Marland Kitchen dexlansoprazole (DEXILANT) 60 MG capsule Take 60 mg by mouth daily.    Marland Kitchen escitalopram (LEXAPRO) 10 MG tablet Take 10 mg by mouth daily.    . ferrous sulfate 325 (65 FE) MG tablet Take 325 mg by mouth daily.    . fluticasone furoate-vilanterol (BREO ELLIPTA) 100-25 MCG/INH AEPB Inhale 1 puff into the lungs daily.    Marland Kitchen levothyroxine (SYNTHROID, LEVOTHROID) 88 MCG tablet Take 1 tablet by mouth daily.    . mometasone (NASONEX) 50 MCG/ACT nasal spray Place 1 spray into both nostrils daily.    . montelukast (SINGULAIR) 10 MG tablet Take 10 mg by mouth daily.    . Omega-3 Fatty Acids (FISH OIL) 1000 MG CAPS Take 2,000 mg by mouth 2 (two) times daily.    . ondansetron (ZOFRAN) 4 MG tablet Take 4 mg by mouth every 8 (eight) hours as needed for nausea/vomiting.    Marland Kitchen  pravastatin (PRAVACHOL) 80 MG tablet Take 80 mg by mouth daily.    . traMADol (ULTRAM) 50 MG tablet Take 100 mg by mouth every 8 (eight) hours as needed for pain.    . furosemide (LASIX) 20 MG tablet Take 1 tablet (20 mg total) by mouth daily for 4 days. 5 tablet 0  . gabapentin (NEURONTIN) 100 MG capsule Take 100 mg by mouth 4 (four) times daily.     No current facility-administered medications for this visit.     Allergies:   Iodinated diagnostic agents; Antihistamines, chlorpheniramine-type; and Meperidine   Social History:  The patient  reports that she has never  smoked. She has never used smokeless tobacco. She reports that she does not drink alcohol or use drugs.   Family History:  The patient's  family history includes Aneurysm in her father; Kidney failure in her mother; Stroke in her mother.   ROS:  Please see the history of present illness.   All other systems are personally reviewed and negative.    Exam:    Vital Signs:  BP 130/72   Pulse 70   Wt 115 lb (52.2 kg)   BMI 22.46 kg/m   Over the phone, no acute distress, no obvious shortness of breath.   Labs/Other Tests and Data Reviewed:    Recent Labs: 09/27/2018: ALT 15; BUN 19; Creatinine, Ser 0.99; Hemoglobin 12.3; Platelets 212.0; Potassium 5.1; Sodium 140   Wt Readings from Last 3 Encounters:  02/07/19 115 lb (52.2 kg)  10/11/18 114 lb (51.7 kg)  01/22/18 117 lb 3.2 oz (53.2 kg)     Other studies personally reviewed: Additional studies/ records that were reviewed today include: Epic notes    Last device remote is reviewed from [facility] PDF dated 12/17/18 which reveals normal device function, no arrhythmias    ASSESSMENT & PLAN:    1.  Permanent atrial fibrillation: Status post AV node ablation and implant of a Medtronic CRT-P.  Currently on Eliquis.  Chads2VASc 4.  Device functioning appropriately.  No changes.  2.  Hypertension: Currently well controlled.  3.  Hyperlipidemia: Continue pravastatin per primary cardiology.  4.  Status post AV node ablation: Status post Medtronic CRT-P.  Device set at VVIR.  COVID 19 screen The patient denies symptoms of COVID 19 at this time.  The importance of social distancing was discussed today.  Follow-up: 1 year Next remote: 03/16/19  Current medicines are reviewed at length with the patient today.   The patient does not have concerns regarding her medicines.  The following changes were made today:  none  Labs/ tests ordered today include:  No orders of the defined types were placed in this encounter.  This visit was  performed over the phone.  The patient currently does not have a cell phone or computer.  The visit and thus could not be performed over a video.  Patient Risk:  after full review of this patients clinical status, I feel that they are at moderate risk at this time.  Today, I have spent 12 minutes with the patient with telehealth technology discussing atrial fibrillation, pacemaker.    Signed, [name], MD  02/07/2019 9:52 AM     [facility] [address] [place] [address] [phone] (office) [phone] (fax)

## 2019-02-07 NOTE — Telephone Encounter (Signed)
Virtual Visit Pre-Appointment Phone Call  Steps For Call:  1. Confirm consent - "In the setting of the current Covid19 crisis, you are scheduled for a (phone or video) visit with your provider on (date) at (time).  Just as we do with many in-office visits, in order for you to participate in this visit, we must obtain consent.  If you'd like, I can send this to your mychart (if signed up) or email for you to review.  Otherwise, I can obtain your verbal consent now.  All virtual visits are billed to your insurance company just like a normal visit would be.  By agreeing to a virtual visit, we'd like you to understand that the technology does not allow for your provider to perform an examination, and thus may limit your provider's ability to fully assess your condition.  Finally, though the technology is pretty good, we cannot assure that it will always work on either your or our end, and in the setting of a video visit, we may have to convert it to a phone-only visit.  In either situation, we cannot ensure that we have a secure connection.  Are you willing to proceed?"  2. Give patient instructions for WebEx download to smartphone as below if video visit  3. Advise patient to be prepared with any vital sign or heart rhythm information, their current medicines, and a piece of paper and pen handy for any instructions they may receive the day of their visit  4. Inform patient they will receive a phone call 15 minutes prior to their appointment time (may be from unknown caller ID) so they should be prepared to answer  5. Confirm that appointment type is correct in Epic appointment notes (video vs telephone)    TELEPHONE CALL NOTE  Nichole Nelson has been deemed a candidate for a follow-up tele-health visit to limit community exposure during the Covid-19 pandemic. I spoke with the patient via phone to ensure availability of phone/video source, confirm preferred email & phone number, and  discuss instructions and expectations.  I reminded Nichole Nelson to be prepared with any vital sign and/or heart rhythm information that could potentially be obtained via home monitoring, at the time of her visit. I reminded Nichole Nelson to expect a phone call at the time of her visit if her visit.  Did the patient verbally acknowledge consent to treatment? YES  Stanton Kidney, RN 02/07/2019 8:30 AM   DOWNLOADING THE WEBEX SOFTWARE TO SMARTPHONE  - If Apple, go to [employer] and type in WebEx in the search bar. [place] [employer], the blue/green circle. The app is free but as with any other app downloads, their phone may require them to verify saved payment information or Apple password. The patient does NOT have to create an account.  - If Android, ask patient to go to [employer] and type in WebEx in the search bar. [place] [employer], the blue/green circle. The app is free but as with any other app downloads, their phone may require them to verify saved payment information or Android password. The patient does NOT have to create an account.   CONSENT FOR TELE-HEALTH VISIT - PLEASE REVIEW  I hereby voluntarily request, consent and authorize [facility] and its employed or contracted physicians, physician assistants, nurse practitioners or other licensed health care professionals (the Practitioner), to provide me with telemedicine health care services (the "Services") as deemed necessary by the treating Practitioner.  I acknowledge and consent to receive the Services by the Practitioner via telemedicine. I understand that the telemedicine visit will involve communicating with the Practitioner through live audiovisual communication technology and the disclosure of certain medical information by electronic transmission. I acknowledge that I have been given the opportunity to request an in-person assessment or other available alternative prior to the  telemedicine visit and am voluntarily participating in the telemedicine visit.  I understand that I have the right to withhold or withdraw my consent to the use of telemedicine in the course of my care at any time, without affecting my right to future care or treatment, and that the Practitioner or I may terminate the telemedicine visit at any time. I understand that I have the right to inspect all information obtained and/or recorded in the course of the telemedicine visit and may receive copies of available information for a reasonable fee.  I understand that some of the potential risks of receiving the Services via telemedicine include:  Marland Kitchen Delay or interruption in medical evaluation due to technological equipment failure or disruption; . Information transmitted may not be sufficient (e.g. poor resolution of images) to allow for appropriate medical decision making by the Practitioner; and/or  . In rare instances, security protocols could fail, causing a breach of personal health information.  Furthermore, I acknowledge that it is my responsibility to provide information about my medical history, conditions and care that is complete and accurate to the best of my ability. I acknowledge that Practitioner's advice, recommendations, and/or decision may be based on factors not within their control, such as incomplete or inaccurate data provided by me or distortions of diagnostic images or specimens that may result from electronic transmissions. I understand that the practice of medicine is not an exact science and that Practitioner makes no warranties or guarantees regarding treatment outcomes. I acknowledge that I will receive a copy of this consent concurrently upon execution via email to the email address I last provided but may also request a printed copy by calling the office of [facility].    I understand that my insurance will be billed for this visit.   I have read or had this consent read to me.  . I understand the contents of this consent, which adequately explains the benefits and risks of the Services being provided via telemedicine.  . I have been provided ample opportunity to ask questions regarding this consent and the Services and have had my questions answered to my satisfaction. . I give my informed consent for the services to be provided through the use of telemedicine in my medical care  By participating in this telemedicine visit I agree to the above.

## 2019-02-10 DIAGNOSIS — I10 Essential (primary) hypertension: Secondary | ICD-10-CM | POA: Diagnosis not present

## 2019-02-10 DIAGNOSIS — K219 Gastro-esophageal reflux disease without esophagitis: Secondary | ICD-10-CM | POA: Diagnosis not present

## 2019-02-10 DIAGNOSIS — E559 Vitamin D deficiency, unspecified: Secondary | ICD-10-CM | POA: Diagnosis not present

## 2019-02-10 DIAGNOSIS — G2581 Restless legs syndrome: Secondary | ICD-10-CM | POA: Diagnosis not present

## 2019-02-10 DIAGNOSIS — G250 Essential tremor: Secondary | ICD-10-CM | POA: Diagnosis not present

## 2019-02-10 DIAGNOSIS — M545 Low back pain: Secondary | ICD-10-CM | POA: Diagnosis not present

## 2019-02-10 DIAGNOSIS — K5909 Other constipation: Secondary | ICD-10-CM | POA: Diagnosis not present

## 2019-02-11 DIAGNOSIS — N3281 Overactive bladder: Secondary | ICD-10-CM | POA: Diagnosis not present

## 2019-02-13 DIAGNOSIS — Z749 Problem related to care provider dependency, unspecified: Secondary | ICD-10-CM | POA: Diagnosis not present

## 2019-03-10 DIAGNOSIS — G2581 Restless legs syndrome: Secondary | ICD-10-CM | POA: Diagnosis not present

## 2019-03-10 DIAGNOSIS — M810 Age-related osteoporosis without current pathological fracture: Secondary | ICD-10-CM | POA: Diagnosis not present

## 2019-03-10 DIAGNOSIS — M545 Low back pain: Secondary | ICD-10-CM | POA: Diagnosis not present

## 2019-03-10 DIAGNOSIS — Z1231 Encounter for screening mammogram for malignant neoplasm of breast: Secondary | ICD-10-CM | POA: Diagnosis not present

## 2019-03-10 DIAGNOSIS — R531 Weakness: Secondary | ICD-10-CM | POA: Diagnosis not present

## 2019-03-14 DIAGNOSIS — N3281 Overactive bladder: Secondary | ICD-10-CM | POA: Diagnosis not present

## 2019-03-15 DIAGNOSIS — Z749 Problem related to care provider dependency, unspecified: Secondary | ICD-10-CM | POA: Diagnosis not present

## 2019-03-16 ENCOUNTER — Ambulatory Visit (INDEPENDENT_AMBULATORY_CARE_PROVIDER_SITE_OTHER): Payer: Medicare HMO | Admitting: *Deleted

## 2019-03-16 DIAGNOSIS — I442 Atrioventricular block, complete: Secondary | ICD-10-CM

## 2019-03-17 LAB — CUP PACEART REMOTE DEVICE CHECK
Battery Remaining Longevity: 35 mo
Battery Voltage: 2.95 V
Brady Statistic AP VP Percent: 0 %
Brady Statistic AP VS Percent: 0 %
Brady Statistic AS VP Percent: 99.45 %
Brady Statistic AS VS Percent: 0.55 %
Brady Statistic RA Percent Paced: 0 %
Brady Statistic RV Percent Paced: 99.79 %
Date Time Interrogation Session: 20200521041347
Implantable Lead Implant Date: 20150916
Implantable Lead Implant Date: 20150916
Implantable Lead Implant Date: 20150916
Implantable Lead Location: 753859
Implantable Lead Location: 753860
Implantable Lead Location: 753860
Implantable Lead Model: 5076
Implantable Lead Model: 5076
Implantable Lead Model: 5076
Implantable Pulse Generator Implant Date: 20150916
Lead Channel Impedance Value: 228 Ohm
Lead Channel Impedance Value: 285 Ohm
Lead Channel Impedance Value: 323 Ohm
Lead Channel Impedance Value: 361 Ohm
Lead Channel Impedance Value: 399 Ohm
Lead Channel Impedance Value: 418 Ohm
Lead Channel Impedance Value: 513 Ohm
Lead Channel Impedance Value: 570 Ohm
Lead Channel Impedance Value: 608 Ohm
Lead Channel Pacing Threshold Amplitude: 0.625 V
Lead Channel Pacing Threshold Amplitude: 0.75 V
Lead Channel Pacing Threshold Amplitude: 0.875 V
Lead Channel Pacing Threshold Pulse Width: 0.4 ms
Lead Channel Pacing Threshold Pulse Width: 0.4 ms
Lead Channel Pacing Threshold Pulse Width: 0.4 ms
Lead Channel Sensing Intrinsic Amplitude: 3.75 mV
Lead Channel Sensing Intrinsic Amplitude: 3.75 mV
Lead Channel Sensing Intrinsic Amplitude: 6.625 mV
Lead Channel Sensing Intrinsic Amplitude: 6.625 mV
Lead Channel Setting Pacing Amplitude: 2 V
Lead Channel Setting Pacing Amplitude: 2.5 V
Lead Channel Setting Pacing Pulse Width: 0.4 ms
Lead Channel Setting Pacing Pulse Width: 0.4 ms
Lead Channel Setting Sensing Sensitivity: 4 mV

## 2019-03-25 ENCOUNTER — Encounter: Payer: Self-pay | Admitting: Cardiology

## 2019-03-25 NOTE — Progress Notes (Signed)
Remote pacemaker transmission.   

## 2019-03-30 ENCOUNTER — Telehealth: Payer: Self-pay | Admitting: *Deleted

## 2019-03-30 MED ORDER — FUROSEMIDE 20 MG PO TABS: 20.0000 mg | ORAL_TABLET | Freq: Every day | ORAL | 0 refills | Status: DC

## 2019-03-30 NOTE — Telephone Encounter (Signed)
Pt and son Lauretta Chester was informed of optivol readings and told to take Lasix 20 mg daily x 7 days, script was sent fro 30 tablets but to only take for 7 days, also a note went to [place] for [facility] clinic follow up. Son/ Chet stated that the battery was not holding on the optivol transmitter, he was told to call number on box for service, if unable to locate he was told to call device clinic for further assistance. Pt/ son verbalized understanding.

## 2019-03-30 NOTE — Telephone Encounter (Signed)
Received ICM referral and transmission scheduled for 04/05/2019.  Will provide ICM intro at time of remote transmission follow up.

## 2019-03-30 NOTE — Telephone Encounter (Signed)
-----   Message from [name], MD sent at 03/24/2019 10:18 AM EDT ----- Abnormal device interrogation reviewed.  Lead parameters and battery status stable.  optivol elevated. Start lasix 20 mg daily for the next 7 days. Enroll in [facility] clinic.

## 2019-04-05 ENCOUNTER — Telehealth: Payer: Self-pay

## 2019-04-05 NOTE — Telephone Encounter (Signed)
Referred to [facility] clinic by Dr Curt Bears.   Spoke with patient and she preferred I speak with her son, Harrie Jeans Juanda Crumble).  ICM intro given agreeable to monthly follow up.  Explained Dr Curt Bears prescribed Lasix 03/30/2019 for patient to take x 7 days and would like a new remote transmission report sent to check if the medication was effective for eliminating fluid retention.  He reported they forgot to pick up the prescription until today.  He will start patient on the daily dose x 7 days tomorrow.  He reports the monitor shows low battery and had difficulty sending last transmission.  Provided [employer] support number [phone] and advised to call to get assistance with monitor so report can be sent next week for follow up.  Provided ICM number and encouraged to call if patient has any problems with taking Lasix or symptoms.  [employer] remote transmission scheduled for 04/12/2019.

## 2019-04-07 DIAGNOSIS — F418 Other specified anxiety disorders: Secondary | ICD-10-CM | POA: Diagnosis not present

## 2019-04-07 DIAGNOSIS — I10 Essential (primary) hypertension: Secondary | ICD-10-CM | POA: Diagnosis not present

## 2019-04-07 DIAGNOSIS — M545 Low back pain: Secondary | ICD-10-CM | POA: Diagnosis not present

## 2019-04-07 DIAGNOSIS — G2581 Restless legs syndrome: Secondary | ICD-10-CM | POA: Diagnosis not present

## 2019-04-07 DIAGNOSIS — Z6822 Body mass index (BMI) 22.0-22.9, adult: Secondary | ICD-10-CM | POA: Diagnosis not present

## 2019-04-07 DIAGNOSIS — R110 Nausea: Secondary | ICD-10-CM | POA: Diagnosis not present

## 2019-04-15 ENCOUNTER — Telehealth: Payer: Self-pay

## 2019-04-15 DIAGNOSIS — Z749 Problem related to care provider dependency, unspecified: Secondary | ICD-10-CM | POA: Diagnosis not present

## 2019-04-15 NOTE — Telephone Encounter (Signed)
ICM call to patient to follow up on remote transmission sent 04/12/2019.  Spoke with patient and son.     Optivol thoracic impedance shows no improvement after taking 7 days of Furosemide.  Thoracic impedance remains decreased suggesting ongoing possible fluid accumulation.  She does not weight at home.  She denies shortness of breath but does have some swelling of the feet and describes them as "not bad".  She says she feels okay.    Advised will send copy to Dr Curt Bears to review and recommendations if needed.     Will recheck fluid levels on 04/20/2019.    04/12/2019 Optivol Report  OBSERVATIONS (3)  Possible fluid accumulation: exceeded OptiVol Threshold, 22-Oct-2018 -- ongoing.  Patient Activity less than 1 hr/day for 60 weeks.  3 month Trend    1 year trend

## 2019-04-18 DIAGNOSIS — N3281 Overactive bladder: Secondary | ICD-10-CM | POA: Diagnosis not present

## 2019-04-19 NOTE — Telephone Encounter (Signed)
Continue lasix for another 7 days. If this does not improve, Nichole Nelson need app appointment for follow up.

## 2019-04-19 NOTE — Telephone Encounter (Signed)
ICM follow up call to patient and son.  Advised Dr Curt Bears recommended she take another 7 days of 20 mg Furosemide daily.  Son said he will give 1st dose today. Patient reports she is feeling fine and denied fluid symptoms that were reviewed. She does not weigh at home.  Advised to limit salt intake.  ICM remote transmission rescheduled from 6/24 to 6/30 to recheck fluid levels.

## 2019-04-26 ENCOUNTER — Ambulatory Visit (INDEPENDENT_AMBULATORY_CARE_PROVIDER_SITE_OTHER): Payer: Medicare HMO

## 2019-04-26 ENCOUNTER — Telehealth: Payer: Self-pay | Admitting: *Deleted

## 2019-04-26 ENCOUNTER — Telehealth: Payer: Self-pay

## 2019-04-26 DIAGNOSIS — I4819 Other persistent atrial fibrillation: Secondary | ICD-10-CM

## 2019-04-26 DIAGNOSIS — Z950 Presence of cardiac pacemaker: Secondary | ICD-10-CM

## 2019-04-26 NOTE — Telephone Encounter (Signed)
ICM follow up call to patient and son.  She is asymptomatic and denies fluid symptoms.  She says she follow a low salt diet and avoids foods that are high in salt.  Eats take out food maybe 2-3 times a month.  She drinks < 64 oz fluid daily.      Optivol thoracic impedance continues to suggest ongoing fluid accumulation since December but much improved after taking 14 days of Lasix as prescribed.    Prescribed: Furosemide 20 mg 1 tablet for total of 14 days.   Recommendations:  Advised Dr Curt Bears will review report and make the determination if she should continue with taking Lasix long term.    Follow-up plan: ICM clinic phone appointment on 05/03/2019 to recheck fluid levels.     Copy of ICM check sent to Dr. Curt Bears.   3 month ICM trend: 04/26/2019    1 Year ICM trend:

## 2019-04-26 NOTE — Progress Notes (Signed)
EPIC Encounter for ICM Monitoring  Patient Name: Nichole Nelson is a 84 y.o. female Date: 04/26/2019 Primary Care Physican: Nicholos Johns, MD Primary Cardiologist: Agustin Cree Electrophysiologist: Curt Bears Bi-V Pacing:   99.9%  Last Weight: Does not weigh at home       Heart Failure questions reviewed with patient and son.  Pt asymptomatic.  She denies fluid symptoms. Discussed diet and reports she follows a low salt diet.  She drinks < 64 oz fluid daily.      Optivol thoracic impedance continues to be suggestive of ongoing fluid accumulation since December but much improved after taking 14 days of Lasix as prescribed.    Prescribed: Furosemide 20 mg 1 tablet for total of 14 days.   Recommendations:  Advised Dr Curt Bears will make the determination if she should continue with taking Lasix long term.    Follow-up plan: ICM clinic phone appointment on 05/03/2019 to recheck fluid levels.     Copy of ICM check and phone note sent to Dr. Curt Bears.   3 month ICM trend: 04/26/2019    1 Year ICM trend:       Rosalene Billings, RN 04/26/2019 12:53 PM

## 2019-04-26 NOTE — Telephone Encounter (Signed)
Spoke with patient.  Advised to send a remote transmission from home monitor for Dr Curt Bears to review.  Explained I will call back after his reviews report and recommendations if she should continue to take the Lasix.  She said she will have her son send the report.

## 2019-04-26 NOTE — Telephone Encounter (Signed)
Son wants to know if pt needs to stay on fluid pill that pt has been taking for 7 days. She has finished the 7 days and someone was supposed to call. Please advise

## 2019-04-27 NOTE — Telephone Encounter (Signed)
Received: Today Message Contents  Nichole Haw, MD  Short, Laurie Panda, RN        Continue lasix for the next 4 days. Will need BMP in 1 week to eval kidney function.

## 2019-04-27 NOTE — Progress Notes (Signed)
Call to patient and advised Dr Curt Bears ordered another 4 days of Lasix 20 mg 1 tablet daily.  Also will need labs drawn 05/04/2019.  The closest [facility] is in [place].  Advised will fax the lab order to [facility] at [place]. [address] and she would like to make an appt to call that facility.  She said that would be fine.

## 2019-04-27 NOTE — Telephone Encounter (Signed)
       Call to patient and advised Dr Curt Bears ordered another 4 days of Lasix 20 mg 1 tablet daily.  Also will need labs drawn 05/04/2019.  The closest [facility] is in [place].  Advised will fax the lab order to [facility] at [place]. [address] and she would like to make an appt to call that facility.  She said that would be fine.

## 2019-04-27 NOTE — Progress Notes (Signed)
Call to [facility] and confirmed fax number and BMET order faxed.

## 2019-04-27 NOTE — Progress Notes (Signed)
Received: Today Message Contents  Constance Haw, MD  Short, Laurie Panda, RN        Continue lasix for the next 4 days. Will need BMP in 1 week to eval kidney function.

## 2019-05-02 ENCOUNTER — Other Ambulatory Visit: Payer: Self-pay

## 2019-05-02 DIAGNOSIS — I4819 Other persistent atrial fibrillation: Secondary | ICD-10-CM

## 2019-05-02 DIAGNOSIS — Z950 Presence of cardiac pacemaker: Secondary | ICD-10-CM

## 2019-05-02 LAB — BASIC METABOLIC PANEL
BUN/Creatinine Ratio: 16 (ref 12–28)
BUN: 20 mg/dL (ref 8–27)
CO2: 21 mmol/L (ref 20–29)
Calcium: 9.5 mg/dL (ref 8.7–10.3)
Chloride: 96 mmol/L (ref 96–106)
Creatinine, Ser: 1.26 mg/dL — ABNORMAL HIGH (ref 0.57–1.00)
GFR calc Af Amer: 46 mL/min/{1.73_m2} — ABNORMAL LOW (ref >59)
GFR calc non Af Amer: 40 mL/min/{1.73_m2} — ABNORMAL LOW (ref >59)
Glucose: 104 mg/dL — ABNORMAL HIGH (ref 65–99)
Potassium: 4.6 mmol/L (ref 3.5–5.2)
Sodium: 134 mmol/L (ref 134–144)

## 2019-05-04 NOTE — Progress Notes (Signed)
No follow up remote transmission received for 05/03/2019

## 2019-05-05 DIAGNOSIS — M545 Low back pain: Secondary | ICD-10-CM | POA: Diagnosis not present

## 2019-05-05 DIAGNOSIS — F4321 Adjustment disorder with depressed mood: Secondary | ICD-10-CM | POA: Diagnosis not present

## 2019-05-05 DIAGNOSIS — R110 Nausea: Secondary | ICD-10-CM | POA: Diagnosis not present

## 2019-05-05 DIAGNOSIS — N390 Urinary tract infection, site not specified: Secondary | ICD-10-CM | POA: Diagnosis not present

## 2019-05-05 DIAGNOSIS — G2581 Restless legs syndrome: Secondary | ICD-10-CM | POA: Diagnosis not present

## 2019-05-05 DIAGNOSIS — I4891 Unspecified atrial fibrillation: Secondary | ICD-10-CM | POA: Diagnosis not present

## 2019-05-11 ENCOUNTER — Telehealth: Payer: Self-pay

## 2019-05-11 NOTE — Telephone Encounter (Signed)
ICM follow up call to patient and son.  Patient said she is feeling better and denies fluid symptoms.  Son said she had a UTI in the last couple of weeks but that has resolved.  He said she is doing well at this time. Requested a remote transmission be sent to recheck fluid levels after the completion of taking temporary lasix.  He said he would send it now.  Advised I would send a copy to Dr Curt Bears for review and if any further recommendations will give them a call back.

## 2019-05-11 NOTE — Telephone Encounter (Signed)
Optivol thoracic impedance suggests improvement when taking Furosemide 20 mg for the extra 4 days from 7/2-7/5 but became worse starting 7/6.   Patient has taken short term Furosemide dosages for 14 days plus additional 4 days since 04/06/2019.  Currently not prescribed long term dosage.  05/02/2019 Creatinine 1.26, BUN 20, Potassium 4.6, Sodium 134, GFR 40-46 09/27/2018 Creatinine 0.99, BUN 19, Potassium 5.1, Sodium 140, GFR 57.20  Routed to Dr Curt Bears for review and if any recommendations will call her back.    05/11/2019 Optivol Report:   3 Month Trend:    1 year trend:

## 2019-05-12 NOTE — Telephone Encounter (Signed)
Lets have her take lasix 20 mg daily for 1 week then recheck.

## 2019-05-13 DIAGNOSIS — Z139 Encounter for screening, unspecified: Secondary | ICD-10-CM | POA: Diagnosis not present

## 2019-05-13 DIAGNOSIS — Z9181 History of falling: Secondary | ICD-10-CM | POA: Diagnosis not present

## 2019-05-13 DIAGNOSIS — Z1331 Encounter for screening for depression: Secondary | ICD-10-CM | POA: Diagnosis not present

## 2019-05-13 DIAGNOSIS — E785 Hyperlipidemia, unspecified: Secondary | ICD-10-CM | POA: Diagnosis not present

## 2019-05-13 DIAGNOSIS — Z0000 Encounter for general adult medical examination without abnormal findings: Secondary | ICD-10-CM | POA: Diagnosis not present

## 2019-05-13 NOTE — Telephone Encounter (Signed)
Attempted call to patient and left message for return call with ICM number.  Next remote scheduled for 05/23/2019.

## 2019-05-15 DIAGNOSIS — Z749 Problem related to care provider dependency, unspecified: Secondary | ICD-10-CM | POA: Diagnosis not present

## 2019-05-16 DIAGNOSIS — N3281 Overactive bladder: Secondary | ICD-10-CM | POA: Diagnosis not present

## 2019-05-16 NOTE — Telephone Encounter (Signed)
Call to patient.  Advised Dr Curt Bears recommended she take another 7 days of Furosemide 20 mg daily.  Reviewed transmission and explained the fluid accumulation had improved when taking the Furosemide but once it was stopped there was possible worsening of the fluid retention.  Advised to check food labels to determine if she is getting more than 2000 mg daily which is the recommended amount.  She verbalized understanding.  She denies any fluid symptoms and does not weight at home.  Encouraged to call if she experiences any fluid symptoms.  Next remote transmission follow up is 05/23/2019.

## 2019-05-23 ENCOUNTER — Telehealth: Payer: Self-pay

## 2019-05-23 NOTE — Telephone Encounter (Signed)
ICM follow up call to patient. Patient has been taking intermittent Furosemide dosage as ordered by Dr Curt Bears since June.  She is feeling fine today and denies any fluid symptoms.  She has taken the Furosemide 20 mg 1 tablet x 7 days.  Requested she send remote transmission for Dr Curt Bears to review.  She will send a transmission tomorrow.

## 2019-05-25 NOTE — Telephone Encounter (Signed)
Received remote transmission.  Optivol thoracic impedance shows improvement after a repeat of Furosemide 20 mg x 7 days.  Patient has denied fluid symptoms.    Will send copy to Dr Curt Bears for review.    3 month Trend:    1 year trend

## 2019-05-31 ENCOUNTER — Telehealth: Payer: Self-pay

## 2019-05-31 NOTE — Telephone Encounter (Signed)
Spoke with patient for ICM follow up.  Patient has intermittently been taking Furosemide as ordered by Dr Curt Bears since June.  She reports feeling fine and denies any fluid symptoms such as SOB, swelling or weight gain.  Requested she send remote transmission today for Dr Curt Bears to review.  She will send a transmission either later today or tomorrow.  Furosemide 20 mg daily taken for 7 days 7/20 - 7/27.

## 2019-06-01 NOTE — Telephone Encounter (Signed)
Remote Transmission Received 06/01/2019.  Sent to Dr Curt Bears for review.    No significant change in thoracic impedance since patient has been taking Furosemide for 4-7 day intervals since June.    She is asymptomatic.  91 day remote transmission due 06/15/2019.  3 month Optivol Trend:    1 year trend

## 2019-06-01 NOTE — Telephone Encounter (Signed)
EF is normal, lets jsut have her follow up in clinic at her normal time to discuss if she is asymptomatic.

## 2019-06-02 DIAGNOSIS — M545 Low back pain: Secondary | ICD-10-CM | POA: Diagnosis not present

## 2019-06-02 DIAGNOSIS — E559 Vitamin D deficiency, unspecified: Secondary | ICD-10-CM | POA: Diagnosis not present

## 2019-06-02 DIAGNOSIS — Z6822 Body mass index (BMI) 22.0-22.9, adult: Secondary | ICD-10-CM | POA: Diagnosis not present

## 2019-06-02 DIAGNOSIS — R739 Hyperglycemia, unspecified: Secondary | ICD-10-CM | POA: Diagnosis not present

## 2019-06-02 DIAGNOSIS — E785 Hyperlipidemia, unspecified: Secondary | ICD-10-CM | POA: Diagnosis not present

## 2019-06-02 DIAGNOSIS — Z79899 Other long term (current) drug therapy: Secondary | ICD-10-CM | POA: Diagnosis not present

## 2019-06-02 DIAGNOSIS — G2581 Restless legs syndrome: Secondary | ICD-10-CM | POA: Diagnosis not present

## 2019-06-02 DIAGNOSIS — I10 Essential (primary) hypertension: Secondary | ICD-10-CM | POA: Diagnosis not present

## 2019-06-06 DIAGNOSIS — Z79899 Other long term (current) drug therapy: Secondary | ICD-10-CM | POA: Diagnosis not present

## 2019-06-06 DIAGNOSIS — R739 Hyperglycemia, unspecified: Secondary | ICD-10-CM | POA: Diagnosis not present

## 2019-06-06 DIAGNOSIS — D649 Anemia, unspecified: Secondary | ICD-10-CM | POA: Diagnosis not present

## 2019-06-06 DIAGNOSIS — E785 Hyperlipidemia, unspecified: Secondary | ICD-10-CM | POA: Diagnosis not present

## 2019-06-06 DIAGNOSIS — E559 Vitamin D deficiency, unspecified: Secondary | ICD-10-CM | POA: Diagnosis not present

## 2019-06-08 NOTE — Telephone Encounter (Signed)
Spoke with patient.  Advised Dr Curt Bears encouraged her to call if she starts to have fluid symptoms.  She said she is feeling fine at this time.  Will continue remote transmission monitoring every 3 months.

## 2019-06-15 ENCOUNTER — Ambulatory Visit (INDEPENDENT_AMBULATORY_CARE_PROVIDER_SITE_OTHER): Payer: Medicare HMO | Admitting: *Deleted

## 2019-06-15 ENCOUNTER — Encounter: Payer: Self-pay | Admitting: Gastroenterology

## 2019-06-15 DIAGNOSIS — I442 Atrioventricular block, complete: Secondary | ICD-10-CM | POA: Diagnosis not present

## 2019-06-16 DIAGNOSIS — D509 Iron deficiency anemia, unspecified: Secondary | ICD-10-CM | POA: Insufficient documentation

## 2019-06-16 HISTORY — DX: Iron deficiency anemia, unspecified: D50.9

## 2019-06-16 LAB — CUP PACEART REMOTE DEVICE CHECK
Battery Remaining Longevity: 33 mo
Battery Voltage: 2.95 V
Brady Statistic AP VP Percent: 0 %
Brady Statistic AP VS Percent: 0 %
Brady Statistic AS VP Percent: 100 %
Brady Statistic AS VS Percent: 0 %
Brady Statistic RA Percent Paced: 0 %
Brady Statistic RV Percent Paced: 99.97 %
Date Time Interrogation Session: 20200820115146
Implantable Lead Implant Date: 20150916
Implantable Lead Implant Date: 20150916
Implantable Lead Implant Date: 20150916
Implantable Lead Location: 753859
Implantable Lead Location: 753860
Implantable Lead Location: 753860
Implantable Lead Model: 5076
Implantable Lead Model: 5076
Implantable Lead Model: 5076
Implantable Pulse Generator Implant Date: 20150916
Lead Channel Impedance Value: 228 Ohm
Lead Channel Impedance Value: 285 Ohm
Lead Channel Impedance Value: 418 Ohm
Lead Channel Impedance Value: 418 Ohm
Lead Channel Impedance Value: 456 Ohm
Lead Channel Impedance Value: 475 Ohm
Lead Channel Impedance Value: 532 Ohm
Lead Channel Impedance Value: 532 Ohm
Lead Channel Pacing Threshold Amplitude: 0.75 V
Lead Channel Pacing Threshold Amplitude: 1 V
Lead Channel Pacing Threshold Pulse Width: 0.4 ms
Lead Channel Pacing Threshold Pulse Width: 0.4 ms
Lead Channel Sensing Intrinsic Amplitude: 3.75 mV
Lead Channel Sensing Intrinsic Amplitude: 6.625 mV
Lead Channel Setting Pacing Amplitude: 2 V
Lead Channel Setting Pacing Amplitude: 2.5 V
Lead Channel Setting Pacing Pulse Width: 0.4 ms
Lead Channel Setting Pacing Pulse Width: 0.4 ms
Lead Channel Setting Sensing Sensitivity: 4 mV

## 2019-06-17 DIAGNOSIS — N3281 Overactive bladder: Secondary | ICD-10-CM | POA: Diagnosis not present

## 2019-06-21 DIAGNOSIS — D509 Iron deficiency anemia, unspecified: Secondary | ICD-10-CM | POA: Diagnosis not present

## 2019-06-23 ENCOUNTER — Encounter: Payer: Self-pay | Admitting: Cardiology

## 2019-06-23 NOTE — Progress Notes (Signed)
Remote pacemaker transmission.   

## 2019-06-24 ENCOUNTER — Ambulatory Visit: Payer: Medicare HMO | Admitting: Gastroenterology

## 2019-06-27 DIAGNOSIS — Z6822 Body mass index (BMI) 22.0-22.9, adult: Secondary | ICD-10-CM | POA: Diagnosis not present

## 2019-06-27 DIAGNOSIS — G2581 Restless legs syndrome: Secondary | ICD-10-CM | POA: Diagnosis not present

## 2019-06-27 DIAGNOSIS — D509 Iron deficiency anemia, unspecified: Secondary | ICD-10-CM | POA: Diagnosis not present

## 2019-06-27 DIAGNOSIS — M545 Low back pain: Secondary | ICD-10-CM | POA: Diagnosis not present

## 2019-07-18 DIAGNOSIS — N3281 Overactive bladder: Secondary | ICD-10-CM | POA: Diagnosis not present

## 2019-07-27 DIAGNOSIS — E039 Hypothyroidism, unspecified: Secondary | ICD-10-CM | POA: Diagnosis not present

## 2019-07-27 DIAGNOSIS — M159 Polyosteoarthritis, unspecified: Secondary | ICD-10-CM | POA: Diagnosis not present

## 2019-07-27 DIAGNOSIS — I10 Essential (primary) hypertension: Secondary | ICD-10-CM | POA: Diagnosis not present

## 2019-07-27 DIAGNOSIS — E785 Hyperlipidemia, unspecified: Secondary | ICD-10-CM | POA: Diagnosis not present

## 2019-07-27 DIAGNOSIS — M545 Low back pain: Secondary | ICD-10-CM | POA: Diagnosis not present

## 2019-07-27 DIAGNOSIS — G2581 Restless legs syndrome: Secondary | ICD-10-CM | POA: Diagnosis not present

## 2019-08-10 DIAGNOSIS — Z23 Encounter for immunization: Secondary | ICD-10-CM | POA: Diagnosis not present

## 2019-08-17 DIAGNOSIS — N3281 Overactive bladder: Secondary | ICD-10-CM | POA: Diagnosis not present

## 2019-08-22 DIAGNOSIS — N390 Urinary tract infection, site not specified: Secondary | ICD-10-CM | POA: Diagnosis not present

## 2019-08-29 DIAGNOSIS — I4891 Unspecified atrial fibrillation: Secondary | ICD-10-CM | POA: Diagnosis not present

## 2019-08-29 DIAGNOSIS — G2581 Restless legs syndrome: Secondary | ICD-10-CM | POA: Diagnosis not present

## 2019-08-29 DIAGNOSIS — K219 Gastro-esophageal reflux disease without esophagitis: Secondary | ICD-10-CM | POA: Diagnosis not present

## 2019-08-29 DIAGNOSIS — D649 Anemia, unspecified: Secondary | ICD-10-CM | POA: Diagnosis not present

## 2019-08-29 DIAGNOSIS — M545 Low back pain: Secondary | ICD-10-CM | POA: Diagnosis not present

## 2019-08-29 DIAGNOSIS — R54 Age-related physical debility: Secondary | ICD-10-CM | POA: Diagnosis not present

## 2019-09-13 ENCOUNTER — Ambulatory Visit: Payer: Medicare HMO | Admitting: Cardiology

## 2019-09-14 ENCOUNTER — Encounter: Payer: Medicare HMO | Admitting: *Deleted

## 2019-09-16 DIAGNOSIS — N3281 Overactive bladder: Secondary | ICD-10-CM | POA: Diagnosis not present

## 2019-09-28 DIAGNOSIS — F418 Other specified anxiety disorders: Secondary | ICD-10-CM | POA: Diagnosis not present

## 2019-09-28 DIAGNOSIS — F39 Unspecified mood [affective] disorder: Secondary | ICD-10-CM | POA: Diagnosis not present

## 2019-09-28 DIAGNOSIS — M545 Low back pain: Secondary | ICD-10-CM | POA: Diagnosis not present

## 2019-09-28 DIAGNOSIS — E039 Hypothyroidism, unspecified: Secondary | ICD-10-CM | POA: Diagnosis not present

## 2019-09-28 DIAGNOSIS — I10 Essential (primary) hypertension: Secondary | ICD-10-CM | POA: Diagnosis not present

## 2019-10-04 ENCOUNTER — Ambulatory Visit (INDEPENDENT_AMBULATORY_CARE_PROVIDER_SITE_OTHER): Payer: Medicare HMO | Admitting: Cardiology

## 2019-10-04 ENCOUNTER — Other Ambulatory Visit: Payer: Self-pay

## 2019-10-04 VITALS — BP 90/60 | HR 71 | Ht 60.0 in | Wt 120.0 lb

## 2019-10-04 DIAGNOSIS — I10 Essential (primary) hypertension: Secondary | ICD-10-CM | POA: Diagnosis not present

## 2019-10-04 DIAGNOSIS — E782 Mixed hyperlipidemia: Secondary | ICD-10-CM | POA: Diagnosis not present

## 2019-10-04 DIAGNOSIS — I4821 Permanent atrial fibrillation: Secondary | ICD-10-CM | POA: Diagnosis not present

## 2019-10-04 DIAGNOSIS — D509 Iron deficiency anemia, unspecified: Secondary | ICD-10-CM | POA: Diagnosis not present

## 2019-10-04 DIAGNOSIS — I6522 Occlusion and stenosis of left carotid artery: Secondary | ICD-10-CM | POA: Diagnosis not present

## 2019-10-04 DIAGNOSIS — Z950 Presence of cardiac pacemaker: Secondary | ICD-10-CM | POA: Diagnosis not present

## 2019-10-04 DIAGNOSIS — Z9889 Other specified postprocedural states: Secondary | ICD-10-CM | POA: Diagnosis not present

## 2019-10-04 NOTE — Patient Instructions (Signed)
Medication Instructions:  Your physician recommends that you continue on your current medications as directed. Please refer to the Current Medication list given to you today.  *If you need a refill on your cardiac medications before your next appointment, please call your pharmacy*  Lab Work: None.  If you have labs (blood work) drawn today and your tests are completely normal, you will receive your results only by: . MyChart Message (if you have MyChart) OR . A paper copy in the mail If you have any lab test that is abnormal or we need to change your treatment, we will call you to review the results.  Testing/Procedures: Your physician has requested that you have a carotid duplex. This test is an ultrasound of the carotid arteries in your neck. It looks at blood flow through these arteries that supply the brain with blood. Allow one hour for this exam. There are no restrictions or special instructions.    Follow-Up: At [facility], you and your health needs are our priority.  As part of our continuing mission to provide you with exceptional heart care, we have created designated Provider Care Teams.  These Care Teams include your primary Cardiologist (physician) and Advanced Practice Providers (APPs -  Physician Assistants and Nurse Practitioners) who all work together to provide you with the care you need, when you need it.  Your next appointment:   6 month(s)  The format for your next appointment:   In Person  Provider:   Robert Krasowski, MD  Other Instructions  

## 2019-10-04 NOTE — Progress Notes (Signed)
Cardiology Office Note:    Date:  10/04/2019   ID:  Nichole Nelson, DOB 1935/08/22, MRN [id number]  PCP:  Nicholos Johns, MD  Cardiologist:  Jenne Campus, MD    Referring MD: Nicholos Johns, MD   Chief Complaint  Patient presents with  . Follow-up  Weak and tired  History of Present Illness:    Nichole Nelson is a 84 y.o. female with atrial fibrillation, cardiac artery stenosis, essential hypertension comes today to my office for follow-up overall doing well cardiac wise denies have any chest pain tightness squeezing pressure burning chest biggest complaint is fatigue and tiredness.  She said she is getting hold  Past Medical History:  Diagnosis Date  . Abdominal fibromatosis   . Anemia   . Anxiety   . Chronic respiratory failure ([place])   . COPD (chronic obstructive pulmonary disease) ([place])   . CVA (cerebral vascular accident) ([place])   . H/O acute pancreatitis   . History of myocardial infarction   . Hyperlipidemia   . Hypertension   . Thyroid disease     Past Surgical History:  Procedure Laterality Date  . ABDOMINAL HYSTERECTOMY    . AORTIC VALVE REPAIR    . APPENDECTOMY    . CORONARY ARTERY BYPASS GRAFT    . ESOPHAGOGASTRODUODENOSCOPY  01/26/2014   Moderate hiatal hernia. presbyesophagus. No evidence of upper GI bleeding.   . ESOPHAGOGASTRODUODENOSCOPY  01/26/2014   Moderate hiatal hernia. Presbyesophagus. No evidene of upper Gi bleeding.   Marland Kitchen HERNIA REPAIR    . INSERT / REPLACE / REMOVE PACEMAKER     Medtronic    Current Medications: Current Meds  Medication Sig  . apixaban (ELIQUIS) 2.5 MG TABS tablet Take 2.5 mg by mouth 2 (two) times daily.  Marland Kitchen CARTIA XT 120 MG 24 hr capsule Take 1 tablet by mouth daily.  . Cholecalciferol (VITAMIN D3) 2000 units capsule Take 1 capsule by mouth daily.  . clonazePAM (KLONOPIN) 0.5 MG tablet Take 0.5 mg by mouth 2 (two) times daily.  Marland Kitchen dexlansoprazole (DEXILANT) 60 MG capsule Take 60 mg by mouth daily.  Marland Kitchen  escitalopram (LEXAPRO) 10 MG tablet Take 10 mg by mouth daily.  . ferrous sulfate 325 (65 FE) MG tablet Take 325 mg by mouth 2 (two) times daily with a meal.   . fluticasone furoate-vilanterol (BREO ELLIPTA) 100-25 MCG/INH AEPB Inhale 1 puff into the lungs daily.  Marland Kitchen gabapentin (NEURONTIN) 100 MG capsule Take 100 mg by mouth 4 (four) times daily.  Marland Kitchen levothyroxine (SYNTHROID, LEVOTHROID) 88 MCG tablet Take 1 tablet by mouth daily.  . mometasone (NASONEX) 50 MCG/ACT nasal spray Place 1 spray into both nostrils daily.  . montelukast (SINGULAIR) 10 MG tablet Take 10 mg by mouth daily.  . Omega-3 Fatty Acids (FISH OIL) 1000 MG CAPS Take 2,000 mg by mouth 2 (two) times daily.  . ondansetron (ZOFRAN) 4 MG tablet Take 4 mg by mouth every 8 (eight) hours as needed for nausea/vomiting.  . pravastatin (PRAVACHOL) 80 MG tablet Take 80 mg by mouth daily.  . traMADol (ULTRAM) 50 MG tablet Take 100 mg by mouth every 8 (eight) hours as needed for pain.     Allergies:   Iodinated diagnostic agents; Antihistamines, chlorpheniramine-type; and Meperidine   Social History   Socioeconomic History  . Marital status: Widowed    Spouse name: Not on file  . Number of children: Not on file  . Years of education: Not on file  . Highest education level: Not on  file  Occupational History  . Not on file  Social Needs  . Financial resource strain: Not on file  . Food insecurity    Worry: Not on file    Inability: Not on file  . Transportation needs    Medical: Not on file    Non-medical: Not on file  Tobacco Use  . Smoking status: Never Smoker  . Smokeless tobacco: Never Used  Substance and Sexual Activity  . Alcohol use: No  . Drug use: No  . Sexual activity: Not on file  Lifestyle  . Physical activity    Days per week: Not on file    Minutes per session: Not on file  . Stress: Not on file  Relationships  . Social Herbalist on phone: Not on file    Gets together: Not on file    Attends  religious service: Not on file    Active member of club or organization: Not on file    Attends meetings of clubs or organizations: Not on file    Relationship status: Not on file  Other Topics Concern  . Not on file  Social History Narrative  . Not on file     Family History: The patient's family history includes Aneurysm in her father; Kidney failure in her mother; Stroke in her mother. There is no history of Colon cancer. ROS:   Please see the history of present illness.    All 14 point review of systems negative except as described per history of present illness  EKGs/Labs/Other Studies Reviewed:      Recent Labs: 05/02/2019: BUN 20; Creatinine, Ser 1.26; Potassium 4.6; Sodium 134  Recent Lipid Panel No results found for: CHOL, TRIG, HDL, CHOLHDL, VLDL, LDLCALC, LDLDIRECT  Physical Exam:    VS:  BP 90/60   Pulse 71   Ht 5' (1.524 m)   Wt 120 lb (54.4 kg) Comment: Reported  SpO2 96%   BMI 23.44 kg/m     Wt Readings from Last 3 Encounters:  10/04/19 120 lb (54.4 kg)  02/07/19 115 lb (52.2 kg)  10/11/18 114 lb (51.7 kg)     GEN:  Well nourished, well developed in no acute distress HEENT: Normal NECK: No JVD; No carotid bruits LYMPHATICS: No lymphadenopathy CARDIAC: RRR, no murmurs, no rubs, no gallops RESPIRATORY:  Clear to auscultation without rales, wheezing or rhonchi  ABDOMEN: Soft, non-tender, non-distended MUSCULOSKELETAL:  No edema; No deformity  SKIN: Warm and dry LOWER EXTREMITIES: no swelling NEUROLOGIC:  Alert and oriented x 3 PSYCHIATRIC:  Normal affect   ASSESSMENT:    1. Stenosis of left carotid artery   2. Permanent atrial fibrillation ([place])   3. Essential hypertension   4. S/P AV nodal ablation   5. Pacemaker   6. Mixed hyperlipidemia    PLAN:    In order of problems listed above:  1. Symptoms of carotic artery.  We will schedule her to have carotic ultrasound. 2. Permanent atrial fibrillation anticoagulated which I will continue. 3.  Essential hypertension blood pressure well controlled continue present management 4. Status post AV nodal ablation stable 5. Pacemaker recent interrogation reviewed 2 to 4 years left in the device 6. Dyslipidemia followed by internal medicine team   Medication Adjustments/Labs and Tests Ordered: Current medicines are reviewed at length with the patient today.  Concerns regarding medicines are outlined above.  No orders of the defined types were placed in this encounter.  Medication changes: No orders of the defined types  were placed in this encounter.   Signed, Park Liter, MD, [facility] 10/04/2019 4:11 PM    [facility]

## 2019-10-12 IMAGING — DX DG ABDOMEN ACUTE W/ 1V CHEST
4 series · 4 of 4 positions shown · non-contrast
Comparison: Abdominal CT 06/23/2018

CLINICAL DATA: Lower abdominal pain with constipation

EXAM:
DG ABDOMEN ACUTE W/ 1V CHEST

[chest pa]
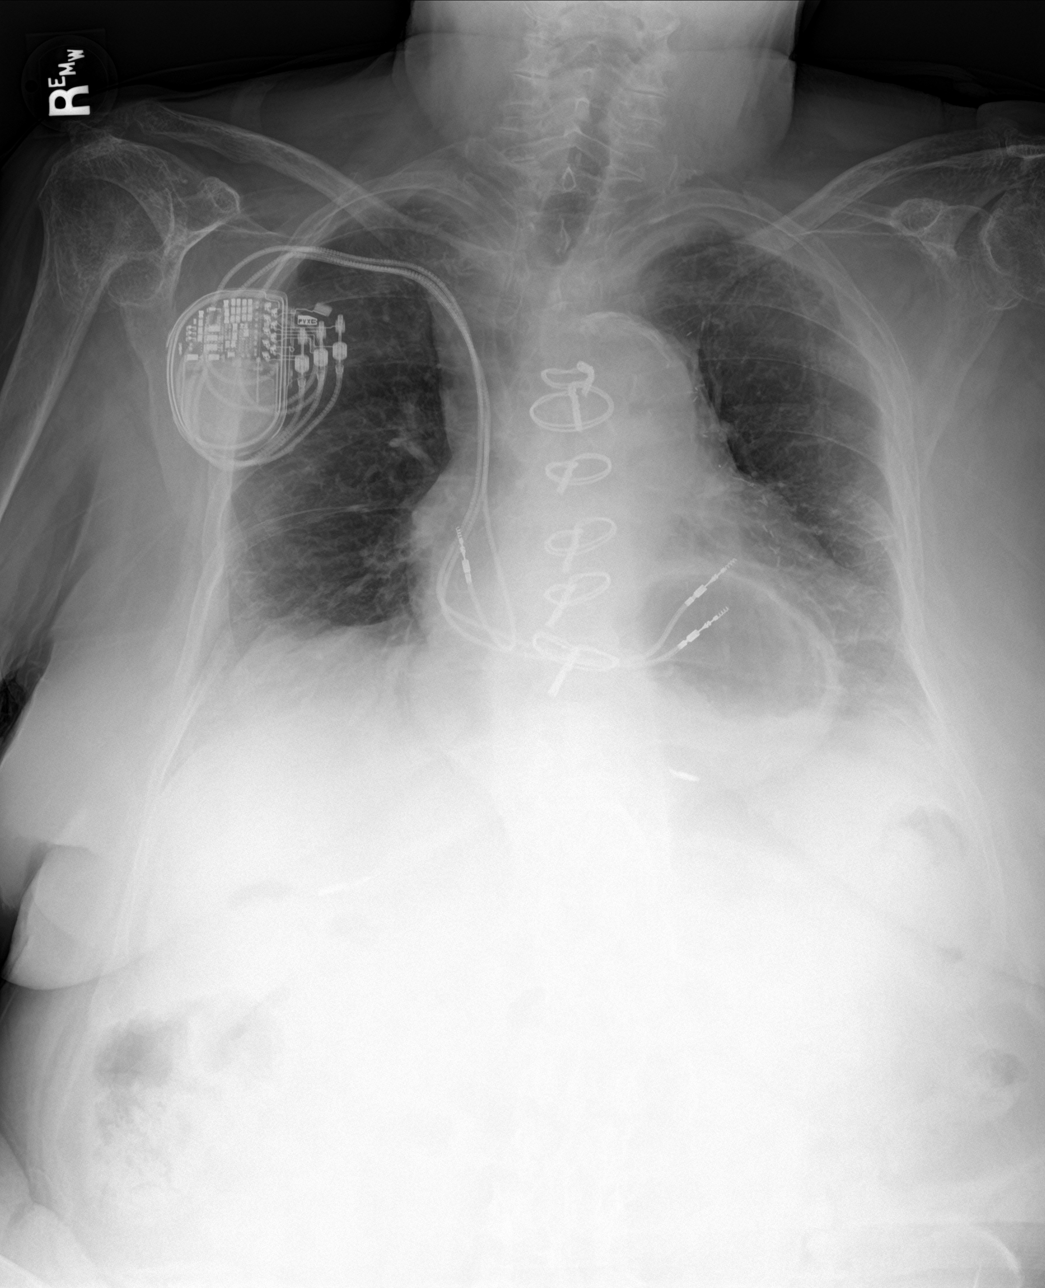

[abdomen erect]
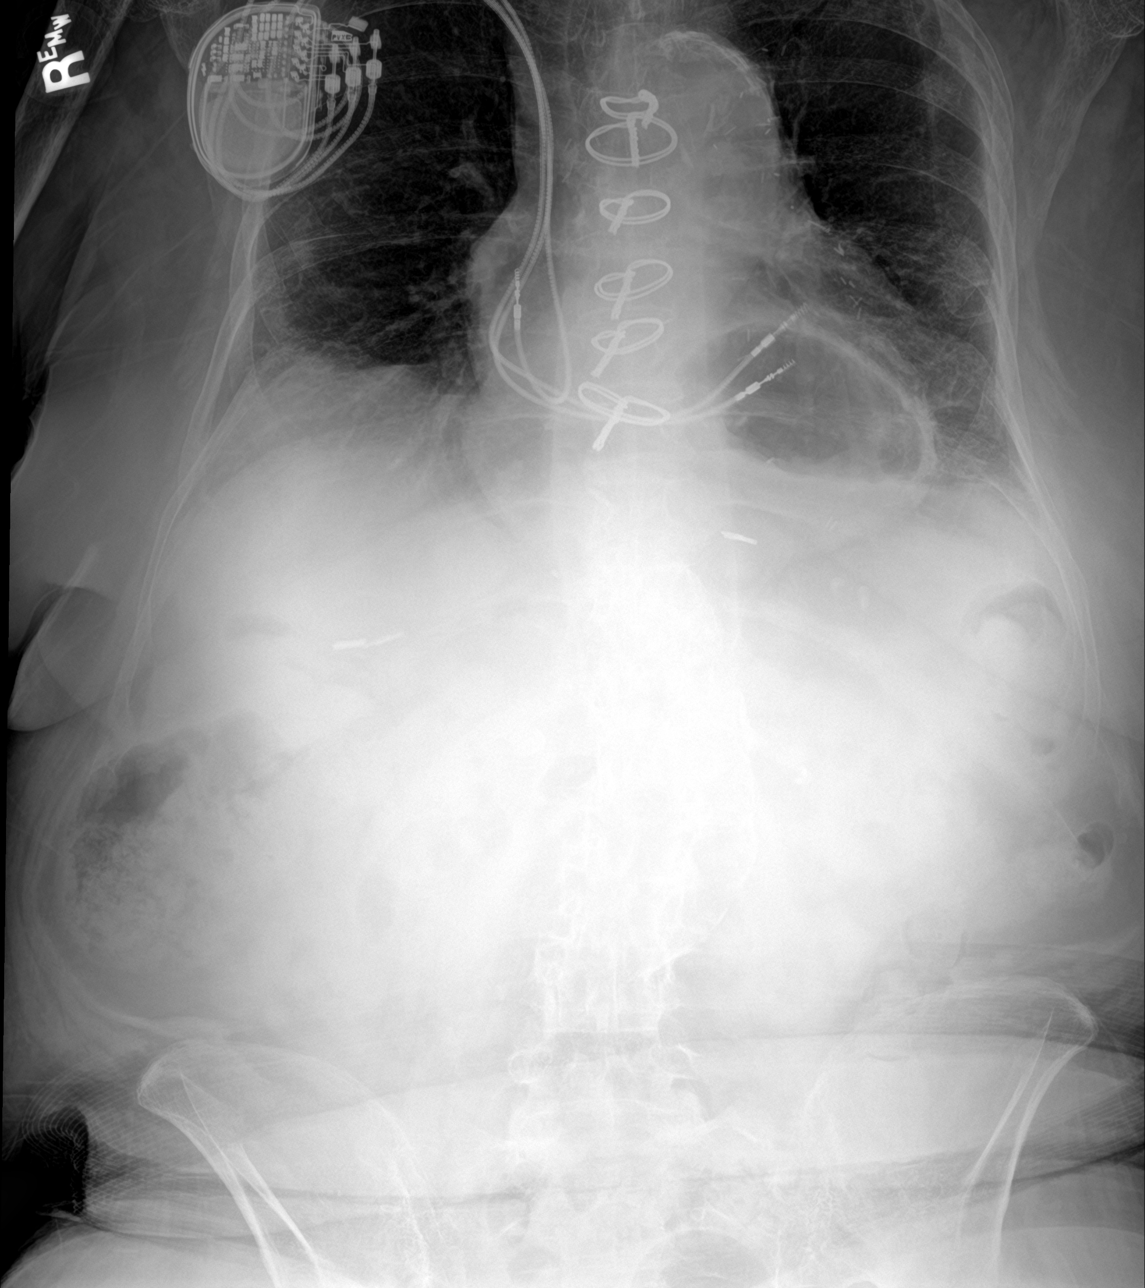

[abdomen supine (1 of 2)]
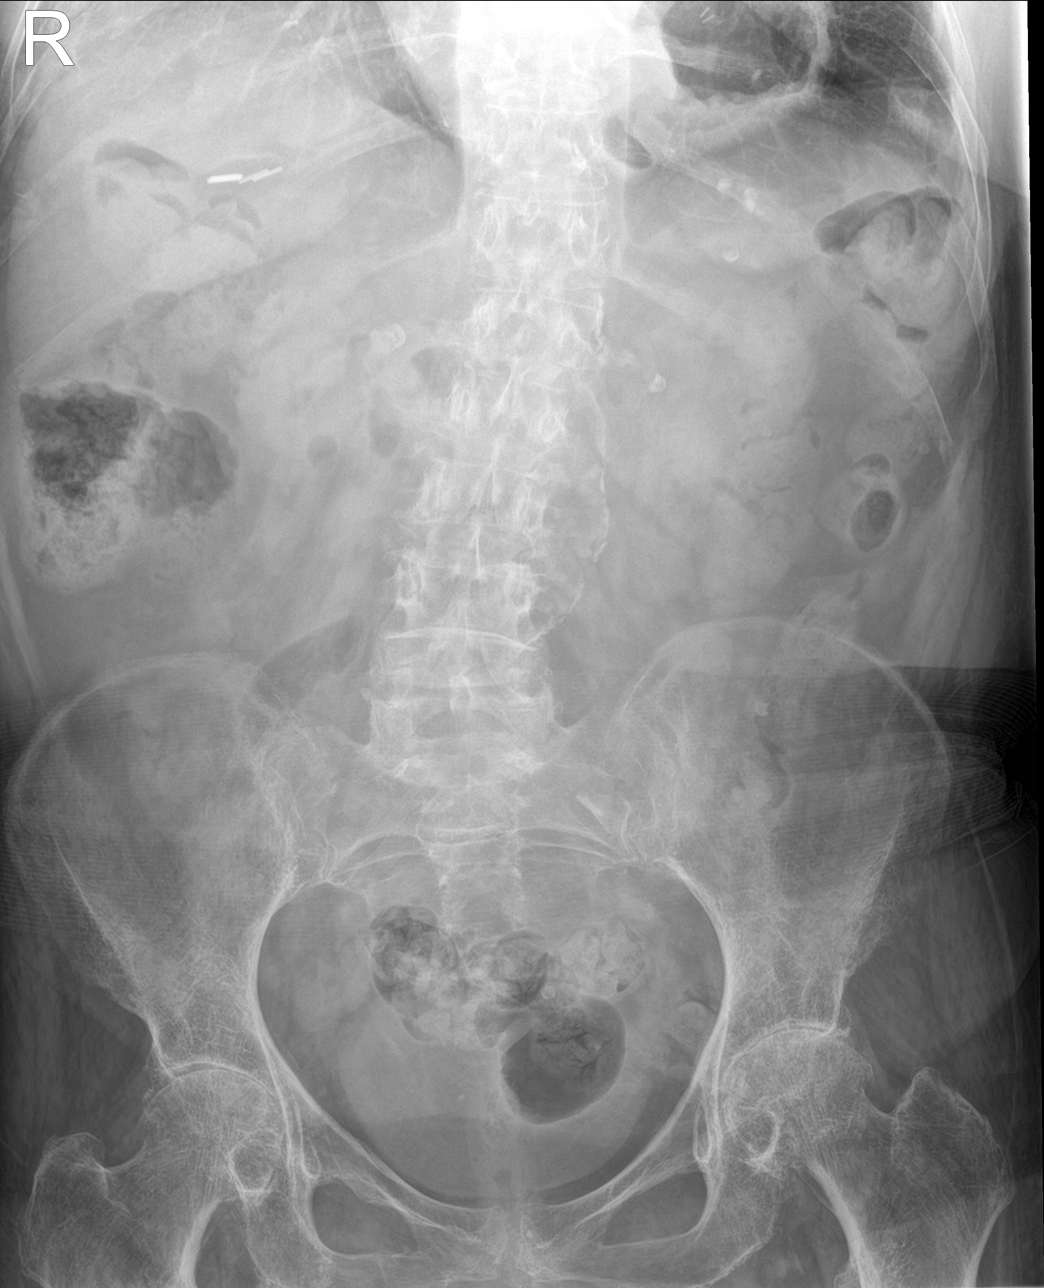

[abdomen supine (2 of 2)]
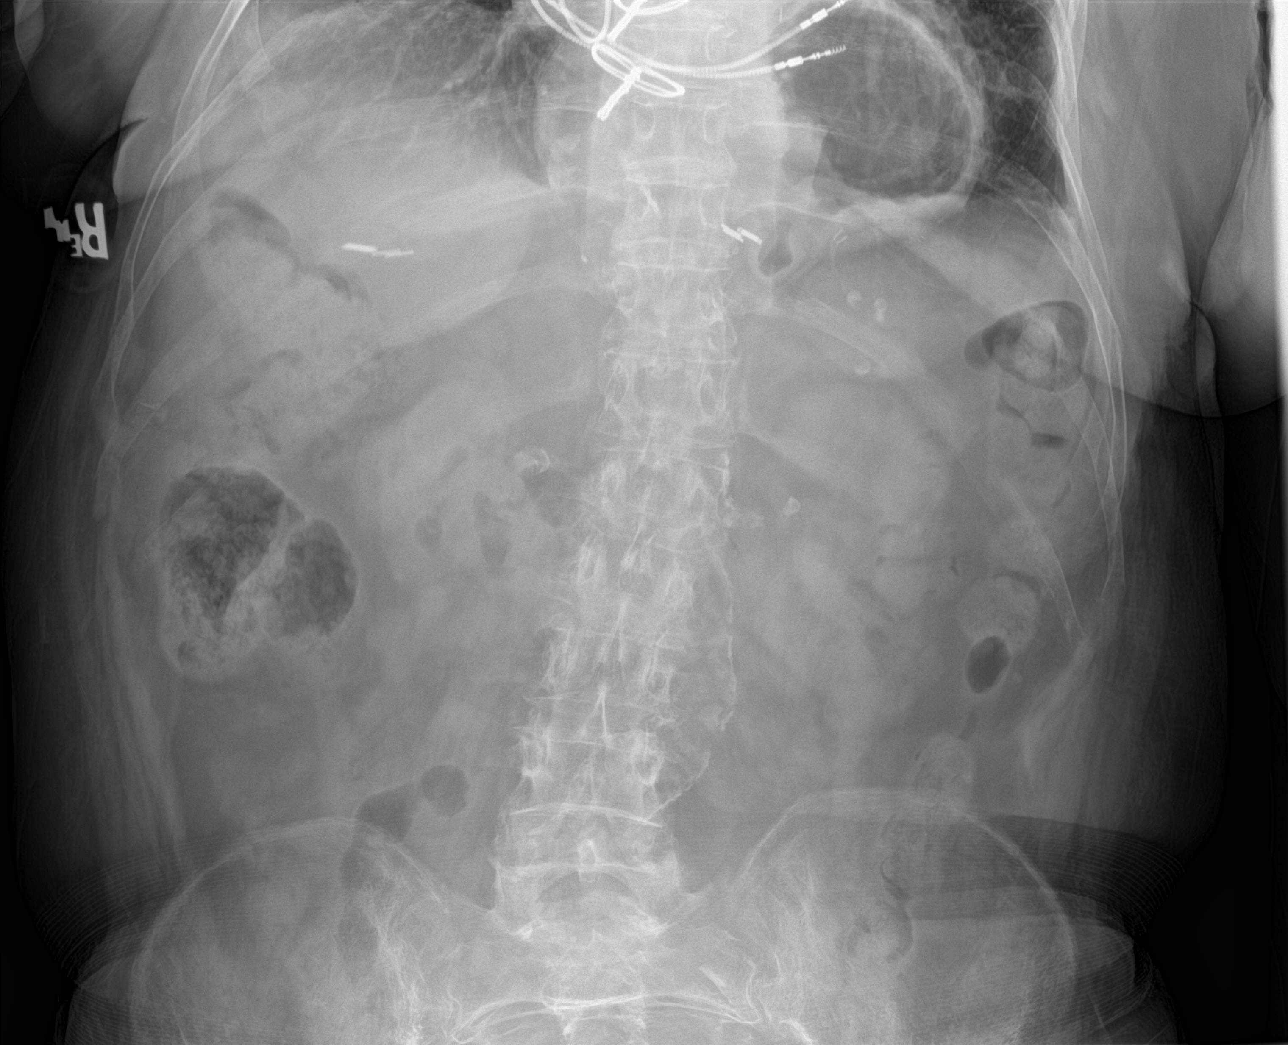

[4 of 4 positions shown; findings below may reference images not displayed]

FINDINGS: Sizable hiatal hernia, known from prior CT.

Cardiomegaly with dual-chamber pacer leads from the right. Chronic
interstitial coarsening. Chronic pleural thickening greatest at the
left apex. There is no edema, consolidation, effusion, or
pneumothorax.

Moderate stool volume. No rectal impaction or obstructive change. No
concerning mass effect. Extensive atherosclerotic calcification
including a calcified right renal artery aneurysm that is known by
CT. Osteopenia. Cholecystectomy.
IMPRESSION: 1. Moderate stool volume without obstruction or rectal impaction.
2. Large hiatal hernia.
3. No acute finding in the chest.

## 2019-10-18 DIAGNOSIS — N3281 Overactive bladder: Secondary | ICD-10-CM | POA: Diagnosis not present

## 2019-10-20 ENCOUNTER — Telehealth: Payer: Self-pay | Admitting: Cardiology

## 2019-10-20 ENCOUNTER — Ambulatory Visit (INDEPENDENT_AMBULATORY_CARE_PROVIDER_SITE_OTHER): Payer: Medicare HMO | Admitting: *Deleted

## 2019-10-20 ENCOUNTER — Other Ambulatory Visit: Payer: Self-pay | Admitting: *Deleted

## 2019-10-20 DIAGNOSIS — Z950 Presence of cardiac pacemaker: Secondary | ICD-10-CM

## 2019-10-20 MED ORDER — APIXABAN 2.5 MG PO TABS: 2.5000 mg | ORAL_TABLET | Freq: Two times a day (BID) | ORAL | 5 refills | Status: DC

## 2019-10-20 NOTE — Telephone Encounter (Signed)
Call eliquis to [facility]

## 2019-10-20 NOTE — Telephone Encounter (Signed)
Refill sent.

## 2019-10-28 LAB — CUP PACEART REMOTE DEVICE CHECK
Battery Remaining Longevity: 33 mo
Battery Voltage: 2.95 V
Brady Statistic AP VP Percent: 0 %
Brady Statistic AP VS Percent: 0 %
Brady Statistic AS VP Percent: 100 %
Brady Statistic AS VS Percent: 0 %
Brady Statistic RA Percent Paced: 0 %
Brady Statistic RV Percent Paced: 99.94 %
Date Time Interrogation Session: 20201224185749
Implantable Lead Implant Date: 20150916
Implantable Lead Implant Date: 20150916
Implantable Lead Implant Date: 20150916
Implantable Lead Location: 753859
Implantable Lead Location: 753860
Implantable Lead Location: 753860
Implantable Lead Model: 5076
Implantable Lead Model: 5076
Implantable Lead Model: 5076
Implantable Pulse Generator Implant Date: 20150916
Lead Channel Impedance Value: 266 Ohm
Lead Channel Impedance Value: 304 Ohm
Lead Channel Impedance Value: 323 Ohm
Lead Channel Impedance Value: 418 Ohm
Lead Channel Impedance Value: 418 Ohm
Lead Channel Impedance Value: 456 Ohm
Lead Channel Impedance Value: 570 Ohm
Lead Channel Impedance Value: 646 Ohm
Lead Channel Impedance Value: 684 Ohm
Lead Channel Pacing Threshold Amplitude: 0.625 V
Lead Channel Pacing Threshold Amplitude: 0.625 V
Lead Channel Pacing Threshold Amplitude: 0.75 V
Lead Channel Pacing Threshold Pulse Width: 0.4 ms
Lead Channel Pacing Threshold Pulse Width: 0.4 ms
Lead Channel Pacing Threshold Pulse Width: 0.4 ms
Lead Channel Sensing Intrinsic Amplitude: 3.75 mV
Lead Channel Sensing Intrinsic Amplitude: 3.75 mV
Lead Channel Sensing Intrinsic Amplitude: 6.625 mV
Lead Channel Sensing Intrinsic Amplitude: 6.625 mV
Lead Channel Setting Pacing Amplitude: 2 V
Lead Channel Setting Pacing Amplitude: 2.5 V
Lead Channel Setting Pacing Pulse Width: 0.4 ms
Lead Channel Setting Pacing Pulse Width: 0.4 ms
Lead Channel Setting Sensing Sensitivity: 4 mV

## 2019-10-31 DIAGNOSIS — M545 Low back pain: Secondary | ICD-10-CM | POA: Diagnosis not present

## 2019-10-31 DIAGNOSIS — I10 Essential (primary) hypertension: Secondary | ICD-10-CM | POA: Diagnosis not present

## 2019-10-31 DIAGNOSIS — K219 Gastro-esophageal reflux disease without esophagitis: Secondary | ICD-10-CM | POA: Diagnosis not present

## 2019-10-31 DIAGNOSIS — E785 Hyperlipidemia, unspecified: Secondary | ICD-10-CM | POA: Diagnosis not present

## 2019-10-31 DIAGNOSIS — R29898 Other symptoms and signs involving the musculoskeletal system: Secondary | ICD-10-CM | POA: Diagnosis not present

## 2019-10-31 DIAGNOSIS — Z79899 Other long term (current) drug therapy: Secondary | ICD-10-CM | POA: Diagnosis not present

## 2019-10-31 DIAGNOSIS — E559 Vitamin D deficiency, unspecified: Secondary | ICD-10-CM | POA: Diagnosis not present

## 2019-10-31 DIAGNOSIS — G2581 Restless legs syndrome: Secondary | ICD-10-CM | POA: Diagnosis not present

## 2019-10-31 DIAGNOSIS — F418 Other specified anxiety disorders: Secondary | ICD-10-CM | POA: Diagnosis not present

## 2019-11-09 DIAGNOSIS — E785 Hyperlipidemia, unspecified: Secondary | ICD-10-CM | POA: Diagnosis not present

## 2019-11-09 DIAGNOSIS — E559 Vitamin D deficiency, unspecified: Secondary | ICD-10-CM | POA: Diagnosis not present

## 2019-11-09 DIAGNOSIS — Z79899 Other long term (current) drug therapy: Secondary | ICD-10-CM | POA: Diagnosis not present

## 2019-11-17 DIAGNOSIS — N3281 Overactive bladder: Secondary | ICD-10-CM | POA: Diagnosis not present

## 2019-11-30 DIAGNOSIS — Z8679 Personal history of other diseases of the circulatory system: Secondary | ICD-10-CM | POA: Diagnosis not present

## 2019-11-30 DIAGNOSIS — G2581 Restless legs syndrome: Secondary | ICD-10-CM | POA: Diagnosis not present

## 2019-11-30 DIAGNOSIS — J309 Allergic rhinitis, unspecified: Secondary | ICD-10-CM | POA: Diagnosis not present

## 2019-11-30 DIAGNOSIS — D649 Anemia, unspecified: Secondary | ICD-10-CM | POA: Diagnosis not present

## 2019-11-30 DIAGNOSIS — M159 Polyosteoarthritis, unspecified: Secondary | ICD-10-CM | POA: Diagnosis not present

## 2019-11-30 DIAGNOSIS — M545 Low back pain: Secondary | ICD-10-CM | POA: Diagnosis not present

## 2019-12-04 DIAGNOSIS — R609 Edema, unspecified: Secondary | ICD-10-CM | POA: Diagnosis not present

## 2019-12-04 DIAGNOSIS — R410 Disorientation, unspecified: Secondary | ICD-10-CM | POA: Diagnosis not present

## 2019-12-04 DIAGNOSIS — R52 Pain, unspecified: Secondary | ICD-10-CM | POA: Diagnosis not present

## 2019-12-04 DIAGNOSIS — I959 Hypotension, unspecified: Secondary | ICD-10-CM | POA: Diagnosis not present

## 2019-12-19 DIAGNOSIS — N3281 Overactive bladder: Secondary | ICD-10-CM | POA: Diagnosis not present

## 2019-12-29 DIAGNOSIS — E039 Hypothyroidism, unspecified: Secondary | ICD-10-CM | POA: Diagnosis not present

## 2019-12-29 DIAGNOSIS — M159 Polyosteoarthritis, unspecified: Secondary | ICD-10-CM | POA: Diagnosis not present

## 2019-12-29 DIAGNOSIS — F418 Other specified anxiety disorders: Secondary | ICD-10-CM | POA: Diagnosis not present

## 2019-12-29 DIAGNOSIS — M545 Low back pain: Secondary | ICD-10-CM | POA: Diagnosis not present

## 2019-12-29 DIAGNOSIS — J449 Chronic obstructive pulmonary disease, unspecified: Secondary | ICD-10-CM | POA: Diagnosis not present

## 2019-12-29 DIAGNOSIS — G2581 Restless legs syndrome: Secondary | ICD-10-CM | POA: Diagnosis not present

## 2019-12-29 DIAGNOSIS — M810 Age-related osteoporosis without current pathological fracture: Secondary | ICD-10-CM | POA: Diagnosis not present

## 2020-01-16 DIAGNOSIS — N3281 Overactive bladder: Secondary | ICD-10-CM | POA: Diagnosis not present

## 2020-01-19 ENCOUNTER — Ambulatory Visit (INDEPENDENT_AMBULATORY_CARE_PROVIDER_SITE_OTHER): Payer: Medicare HMO | Admitting: *Deleted

## 2020-01-19 DIAGNOSIS — Z950 Presence of cardiac pacemaker: Secondary | ICD-10-CM

## 2020-01-19 LAB — CUP PACEART REMOTE DEVICE CHECK
Battery Remaining Longevity: 30 mo
Battery Voltage: 2.93 V
Brady Statistic AP VP Percent: 0 %
Brady Statistic AP VS Percent: 0 %
Brady Statistic AS VP Percent: 99.87 %
Brady Statistic AS VS Percent: 0.13 %
Brady Statistic RA Percent Paced: 0 %
Brady Statistic RV Percent Paced: 99.96 %
Date Time Interrogation Session: 20210325211126
Implantable Lead Implant Date: 20150916
Implantable Lead Implant Date: 20150916
Implantable Lead Implant Date: 20150916
Implantable Lead Location: 753859
Implantable Lead Location: 753860
Implantable Lead Location: 753860
Implantable Lead Model: 5076
Implantable Lead Model: 5076
Implantable Lead Model: 5076
Implantable Pulse Generator Implant Date: 20150916
Lead Channel Impedance Value: 247 Ohm
Lead Channel Impedance Value: 304 Ohm
Lead Channel Impedance Value: 323 Ohm
Lead Channel Impedance Value: 418 Ohm
Lead Channel Impedance Value: 418 Ohm
Lead Channel Impedance Value: 475 Ohm
Lead Channel Impedance Value: 532 Ohm
Lead Channel Impedance Value: 608 Ohm
Lead Channel Impedance Value: 608 Ohm
Lead Channel Pacing Threshold Amplitude: 0.625 V
Lead Channel Pacing Threshold Amplitude: 0.75 V
Lead Channel Pacing Threshold Amplitude: 0.875 V
Lead Channel Pacing Threshold Pulse Width: 0.4 ms
Lead Channel Pacing Threshold Pulse Width: 0.4 ms
Lead Channel Pacing Threshold Pulse Width: 0.4 ms
Lead Channel Sensing Intrinsic Amplitude: 3.75 mV
Lead Channel Sensing Intrinsic Amplitude: 3.75 mV
Lead Channel Sensing Intrinsic Amplitude: 6.625 mV
Lead Channel Sensing Intrinsic Amplitude: 6.625 mV
Lead Channel Setting Pacing Amplitude: 2 V
Lead Channel Setting Pacing Amplitude: 2.5 V
Lead Channel Setting Pacing Pulse Width: 0.4 ms
Lead Channel Setting Pacing Pulse Width: 0.4 ms
Lead Channel Setting Sensing Sensitivity: 4 mV

## 2020-01-19 NOTE — Progress Notes (Signed)
PPM Remote  

## 2020-01-26 DIAGNOSIS — M545 Low back pain: Secondary | ICD-10-CM | POA: Diagnosis not present

## 2020-01-26 DIAGNOSIS — G2581 Restless legs syndrome: Secondary | ICD-10-CM | POA: Diagnosis not present

## 2020-02-15 DIAGNOSIS — N3281 Overactive bladder: Secondary | ICD-10-CM | POA: Diagnosis not present

## 2020-02-24 DIAGNOSIS — M545 Low back pain: Secondary | ICD-10-CM | POA: Diagnosis not present

## 2020-02-24 DIAGNOSIS — K219 Gastro-esophageal reflux disease without esophagitis: Secondary | ICD-10-CM | POA: Diagnosis not present

## 2020-02-24 DIAGNOSIS — J309 Allergic rhinitis, unspecified: Secondary | ICD-10-CM | POA: Diagnosis not present

## 2020-02-24 DIAGNOSIS — G2581 Restless legs syndrome: Secondary | ICD-10-CM | POA: Diagnosis not present

## 2020-02-24 DIAGNOSIS — I10 Essential (primary) hypertension: Secondary | ICD-10-CM | POA: Diagnosis not present

## 2020-02-24 DIAGNOSIS — M159 Polyosteoarthritis, unspecified: Secondary | ICD-10-CM | POA: Diagnosis not present

## 2020-03-14 DIAGNOSIS — N3281 Overactive bladder: Secondary | ICD-10-CM | POA: Diagnosis not present

## 2020-03-22 DIAGNOSIS — F418 Other specified anxiety disorders: Secondary | ICD-10-CM | POA: Diagnosis not present

## 2020-03-22 DIAGNOSIS — E559 Vitamin D deficiency, unspecified: Secondary | ICD-10-CM | POA: Diagnosis not present

## 2020-03-22 DIAGNOSIS — E785 Hyperlipidemia, unspecified: Secondary | ICD-10-CM | POA: Diagnosis not present

## 2020-03-22 DIAGNOSIS — D649 Anemia, unspecified: Secondary | ICD-10-CM | POA: Diagnosis not present

## 2020-03-22 DIAGNOSIS — M545 Low back pain: Secondary | ICD-10-CM | POA: Diagnosis not present

## 2020-04-12 ENCOUNTER — Other Ambulatory Visit: Payer: Self-pay | Admitting: Cardiology

## 2020-04-12 DIAGNOSIS — D509 Iron deficiency anemia, unspecified: Secondary | ICD-10-CM | POA: Diagnosis not present

## 2020-04-12 DIAGNOSIS — C50911 Malignant neoplasm of unspecified site of right female breast: Secondary | ICD-10-CM | POA: Diagnosis not present

## 2020-04-16 DIAGNOSIS — N3281 Overactive bladder: Secondary | ICD-10-CM | POA: Diagnosis not present

## 2020-04-19 ENCOUNTER — Other Ambulatory Visit: Payer: Self-pay

## 2020-04-20 ENCOUNTER — Telehealth: Payer: Self-pay

## 2020-04-20 NOTE — Telephone Encounter (Signed)
Spoke with son to remind of missed remote transmission °

## 2020-04-23 DIAGNOSIS — Z7409 Other reduced mobility: Secondary | ICD-10-CM | POA: Diagnosis not present

## 2020-04-23 DIAGNOSIS — M159 Polyosteoarthritis, unspecified: Secondary | ICD-10-CM | POA: Diagnosis not present

## 2020-04-23 DIAGNOSIS — R531 Weakness: Secondary | ICD-10-CM | POA: Diagnosis not present

## 2020-04-23 DIAGNOSIS — G2581 Restless legs syndrome: Secondary | ICD-10-CM | POA: Diagnosis not present

## 2020-04-23 DIAGNOSIS — F418 Other specified anxiety disorders: Secondary | ICD-10-CM | POA: Diagnosis not present

## 2020-04-23 DIAGNOSIS — R6889 Other general symptoms and signs: Secondary | ICD-10-CM | POA: Diagnosis not present

## 2020-04-23 DIAGNOSIS — M545 Low back pain: Secondary | ICD-10-CM | POA: Diagnosis not present

## 2020-04-23 DIAGNOSIS — M810 Age-related osteoporosis without current pathological fracture: Secondary | ICD-10-CM | POA: Diagnosis not present

## 2020-04-24 ENCOUNTER — Encounter: Payer: Medicare HMO | Admitting: Student

## 2020-04-27 ENCOUNTER — Ambulatory Visit (INDEPENDENT_AMBULATORY_CARE_PROVIDER_SITE_OTHER): Payer: Medicare HMO | Admitting: *Deleted

## 2020-04-27 DIAGNOSIS — I442 Atrioventricular block, complete: Secondary | ICD-10-CM

## 2020-04-30 LAB — CUP PACEART REMOTE DEVICE CHECK
Battery Remaining Longevity: 27 mo
Battery Voltage: 2.93 V
Brady Statistic AP VP Percent: 0 %
Brady Statistic AP VS Percent: 0 %
Brady Statistic AS VP Percent: 100 %
Brady Statistic AS VS Percent: 0 %
Brady Statistic RA Percent Paced: 0 %
Brady Statistic RV Percent Paced: 99.95 %
Date Time Interrogation Session: 20210702221716
Implantable Lead Implant Date: 20150916
Implantable Lead Implant Date: 20150916
Implantable Lead Implant Date: 20150916
Implantable Lead Location: 753859
Implantable Lead Location: 753860
Implantable Lead Location: 753860
Implantable Lead Model: 5076
Implantable Lead Model: 5076
Implantable Lead Model: 5076
Implantable Pulse Generator Implant Date: 20150916
Lead Channel Impedance Value: 247 Ohm
Lead Channel Impedance Value: 285 Ohm
Lead Channel Impedance Value: 323 Ohm
Lead Channel Impedance Value: 418 Ohm
Lead Channel Impedance Value: 418 Ohm
Lead Channel Impedance Value: 475 Ohm
Lead Channel Impedance Value: 513 Ohm
Lead Channel Impedance Value: 570 Ohm
Lead Channel Impedance Value: 589 Ohm
Lead Channel Pacing Threshold Amplitude: 0.625 V
Lead Channel Pacing Threshold Amplitude: 0.875 V
Lead Channel Pacing Threshold Amplitude: 1.875 V
Lead Channel Pacing Threshold Pulse Width: 0.4 ms
Lead Channel Pacing Threshold Pulse Width: 0.4 ms
Lead Channel Pacing Threshold Pulse Width: 0.4 ms
Lead Channel Sensing Intrinsic Amplitude: 3.75 mV
Lead Channel Sensing Intrinsic Amplitude: 3.75 mV
Lead Channel Sensing Intrinsic Amplitude: 6.625 mV
Lead Channel Sensing Intrinsic Amplitude: 6.625 mV
Lead Channel Setting Pacing Amplitude: 2 V
Lead Channel Setting Pacing Amplitude: 2.5 V
Lead Channel Setting Pacing Pulse Width: 0.4 ms
Lead Channel Setting Pacing Pulse Width: 0.4 ms
Lead Channel Setting Sensing Sensitivity: 4 mV

## 2020-05-01 ENCOUNTER — Encounter: Payer: Self-pay | Admitting: Cardiology

## 2020-05-01 ENCOUNTER — Ambulatory Visit (INDEPENDENT_AMBULATORY_CARE_PROVIDER_SITE_OTHER): Payer: Medicare HMO | Admitting: Cardiology

## 2020-05-01 ENCOUNTER — Other Ambulatory Visit: Payer: Self-pay

## 2020-05-01 VITALS — BP 150/82 | HR 77 | Ht 60.0 in | Wt 120.0 lb

## 2020-05-01 DIAGNOSIS — I10 Essential (primary) hypertension: Secondary | ICD-10-CM | POA: Diagnosis not present

## 2020-05-01 DIAGNOSIS — Z9889 Other specified postprocedural states: Secondary | ICD-10-CM

## 2020-05-01 DIAGNOSIS — I6522 Occlusion and stenosis of left carotid artery: Secondary | ICD-10-CM | POA: Diagnosis not present

## 2020-05-01 DIAGNOSIS — I2510 Atherosclerotic heart disease of native coronary artery without angina pectoris: Secondary | ICD-10-CM

## 2020-05-01 DIAGNOSIS — Z950 Presence of cardiac pacemaker: Secondary | ICD-10-CM | POA: Diagnosis not present

## 2020-05-01 DIAGNOSIS — I4821 Permanent atrial fibrillation: Secondary | ICD-10-CM

## 2020-05-01 DIAGNOSIS — Z951 Presence of aortocoronary bypass graft: Secondary | ICD-10-CM | POA: Diagnosis not present

## 2020-05-01 NOTE — Progress Notes (Signed)
Cardiology Office Note:    Date:  05/01/2020   ID:  Nichole Nelson, DOB Jun 27, 1935, MRN [id number]  PCP:  Nicholos Johns, MD  Cardiologist:  Jenne Campus, MD    Referring MD: Nicholos Johns, MD   No chief complaint on file. I am doing fine  History of Present Illness:    Nichole Nelson is a 85 y.o. female with past medical history significant for coronary artery disease, status post coronary artery bypass graft done by Dr. Jerelene Redden in [facility] 11-1/2 years ago, status post dual-chamber pacemaker implantation, permanent atrial fibrillation, status post AV nodal ablation, essential hypertension as well as carotic arterial disease.  Comes today to my office for follow-up.  Overall she lives a very sedentary lifestyle.  She sit in the chair all time she does not move much.  She is able to go to the restroom.  Denies having any chest pain tightness squeezing pressure burning chest no palpitations no dizziness.  Past Medical History:  Diagnosis Date  . Abdominal fibromatosis   . Anemia   . Anxiety   . Chronic respiratory failure ([place])   . COPD (chronic obstructive pulmonary disease) ([place])   . CVA (cerebral vascular accident) ([place])   . H/O acute pancreatitis   . History of myocardial infarction   . Hyperlipidemia   . Hypertension   . Thyroid disease     Past Surgical History:  Procedure Laterality Date  . ABDOMINAL HYSTERECTOMY    . AORTIC VALVE REPAIR    . APPENDECTOMY    . CORONARY ARTERY BYPASS GRAFT    . ESOPHAGOGASTRODUODENOSCOPY  01/26/2014   Moderate hiatal hernia. presbyesophagus. No evidence of upper GI bleeding.   . ESOPHAGOGASTRODUODENOSCOPY  01/26/2014   Moderate hiatal hernia. Presbyesophagus. No evidene of upper Gi bleeding.   Marland Kitchen HERNIA REPAIR    . INSERT / REPLACE / REMOVE PACEMAKER     Medtronic    Current Medications: Current Meds  Medication Sig  . Cholecalciferol (VITAMIN D3) 2000 units capsule Take 1 capsule by mouth daily.    . clonazePAM (KLONOPIN) 0.5 MG tablet Take 0.5 mg by mouth 2 (two) times daily.  Marland Kitchen dexlansoprazole (DEXILANT) 60 MG capsule Take 60 mg by mouth daily.  Marland Kitchen ELIQUIS 2.5 MG TABS tablet TAKE 1 TABLET BY MOUTH TWICE DAILY  . escitalopram (LEXAPRO) 10 MG tablet Take 10 mg by mouth daily.  . fluticasone furoate-vilanterol (BREO ELLIPTA) 100-25 MCG/INH AEPB Inhale 1 puff into the lungs daily.  Marland Kitchen gabapentin (NEURONTIN) 100 MG capsule Take 100 mg by mouth 4 (four) times daily.  Marland Kitchen levothyroxine (SYNTHROID, LEVOTHROID) 88 MCG tablet Take 1 tablet by mouth daily.  Marland Kitchen losartan (COZAAR) 50 MG tablet Take 1 tablet by mouth daily.  . mometasone (NASONEX) 50 MCG/ACT nasal spray Place 1 spray into both nostrils daily.  . montelukast (SINGULAIR) 10 MG tablet Take 10 mg by mouth daily.  . Omega-3 Fatty Acids (FISH OIL) 1000 MG CAPS Take 2,000 mg by mouth 2 (two) times daily.  . ondansetron (ZOFRAN) 4 MG tablet Take 4 mg by mouth every 8 (eight) hours as needed for nausea/vomiting.  . pravastatin (PRAVACHOL) 80 MG tablet Take 80 mg by mouth daily.  . traMADol (ULTRAM) 50 MG tablet Take 100 mg by mouth every 8 (eight) hours as needed for pain.     Allergies:   Iodinated diagnostic agents; Antihistamines, chlorpheniramine-type; and Meperidine   Social History   Socioeconomic History  . Marital status: Widowed  Spouse name: Not on file  . Number of children: Not on file  . Years of education: Not on file  . Highest education level: Not on file  Occupational History  . Not on file  Tobacco Use  . Smoking status: Never Smoker  . Smokeless tobacco: Never Used  Vaping Use  . Vaping Use: Never used  Substance and Sexual Activity  . Alcohol use: No  . Drug use: No  . Sexual activity: Not on file  Other Topics Concern  . Not on file  Social History Narrative  . Not on file   Social Determinants of Health   Financial Resource Strain:   . Difficulty of Paying Living Expenses:   Food Insecurity:   .  Worried About Charity fundraiser in the Last Year:   . Arboriculturist in the Last Year:   Transportation Needs:   . Film/video editor (Medical):   Marland Kitchen Lack of Transportation (Non-Medical):   Physical Activity:   . Days of Exercise per Week:   . Minutes of Exercise per Session:   Stress:   . Feeling of Stress :   Social Connections:   . Frequency of Communication with Friends and Family:   . Frequency of Social Gatherings with Friends and Family:   . Attends Religious Services:   . Active Member of Clubs or Organizations:   . Attends Archivist Meetings:   Marland Kitchen Marital Status:      Family History: The patient's family history includes Aneurysm in her father; Kidney failure in her mother; Stroke in her mother. There is no history of Colon cancer. ROS:   Please see the history of present illness.    All 14 point review of systems negative except as described per history of present illness  EKGs/Labs/Other Studies Reviewed:      Recent Labs: No results found for requested labs within last 8760 hours.  Recent Lipid Panel No results found for: CHOL, TRIG, HDL, CHOLHDL, VLDL, LDLCALC, LDLDIRECT  Physical Exam:    VS:  BP (!) 150/82 (BP Location: Left Arm, Patient Position: Sitting, Cuff Size: Normal)   Pulse 77   Ht 5' (1.524 m)   Wt 120 lb (54.4 kg)   SpO2 96%   BMI 23.44 kg/m     Wt Readings from Last 3 Encounters:  05/01/20 120 lb (54.4 kg)  10/04/19 120 lb (54.4 kg)  02/07/19 115 lb (52.2 kg)     GEN:  Well nourished, well developed in no acute distress HEENT: Normal NECK: No JVD; No carotid bruits LYMPHATICS: No lymphadenopathy CARDIAC: RRR, no murmurs, no rubs, no gallops RESPIRATORY:  Clear to auscultation without rales, wheezing or rhonchi  ABDOMEN: Soft, non-tender, non-distended MUSCULOSKELETAL:  No edema; No deformity  SKIN: Warm and dry LOWER EXTREMITIES: no swelling NEUROLOGIC:  Alert and oriented x 3 PSYCHIATRIC:  Normal affect    ASSESSMENT:    1. Permanent atrial fibrillation ([place])   2. S/P AV nodal ablation   3. Hx of CABG   4. Pacemaker   5. Essential hypertension   6. Stenosis of left carotid artery    PLAN:    In order of problems listed above:  1. Permanent atrial fibrillation.  Rate controlled, she is status post atrial fibrillation ablation.  She is on Eliquis 2.5 twice daily which is adequate for her clinical scenario. 2. Status post AV nodal ablation noted.  She does have a pacemaker. 3. History of coronary artery bypass grafting  well very sedentary lifestyle no symptoms we will continue conservative approach. 4. Pacemaker present: It is a Medtronic device have at least 1.5 years left on the battery.  I did review OptiVol which did have some increased recently but then returned to normal.  Integration has been done last night. 5. Essential hypertension blood pressure mildly elevated but at home usually good.  We will continue present management. 6. Carotic arterial stenosis.  She be scheduled to have carotic ultrasound. 7. Dyslipidemia she is on pravastatin 80 mg which is moderate intensity statin I will continue with that I do have her fasting lipid profile from [facility] in November 09, 2019 her LDL was 65 and HDL was 47.  We will continue present management   Medication Adjustments/Labs and Tests Ordered: Current medicines are reviewed at length with the patient today.  Concerns regarding medicines are outlined above.  No orders of the defined types were placed in this encounter.  Medication changes: No orders of the defined types were placed in this encounter.   Signed, Park Liter, MD, [facility] 05/01/2020 2:06 PM    [facility]

## 2020-05-01 NOTE — Patient Instructions (Signed)
Medication Instructions:  Your physician recommends that you continue on your current medications as directed. Please refer to the Current Medication list given to you today.  *If you need a refill on your cardiac medications before your next appointment, please call your pharmacy*   Lab Work: None/  If you have labs (blood work) drawn today and your tests are completely normal, you will receive your results only by: Marland Kitchen MyChart Message (if you have MyChart) OR . A paper copy in the mail If you have any lab test that is abnormal or we need to change your treatment, we will call you to review the results.   Testing/Procedures: Your physician has requested that you have a carotid duplex. This test is an ultrasound of the carotid arteries in your neck. It looks at blood flow through these arteries that supply the brain with blood. Allow one hour for this exam. There are no restrictions or special instructions.  Your physician has requested that you have an echocardiogram. Echocardiography is a painless test that uses sound waves to create images of your heart. It provides your doctor with information about the size and shape of your heart and how well your heart's chambers and valves are working. This procedure takes approximately one hour. There are no restrictions for this procedure.     Follow-Up: At [facility], you and your health needs are our priority.  As part of our continuing mission to provide you with exceptional heart care, we have created designated Provider Care Teams.  These Care Teams include your primary Cardiologist (physician) and Advanced Practice Providers (APPs -  Physician Assistants and Nurse Practitioners) who all work together to provide you with the care you need, when you need it.  We recommend signing up for the patient portal called "MyChart".  Sign up information is provided on this After Visit Summary.  MyChart is used to connect with patients for Virtual  Visits (Telemedicine).  Patients are able to view lab/test results, encounter notes, upcoming appointments, etc.  Non-urgent messages can be sent to your provider as well.   To learn more about what you can do with MyChart, go to [email or web address].    Your next appointment:   6 month(s)  The format for your next appointment:   In Person  Provider:   Jenne Campus, MD   Other Instructions   Echocardiogram An echocardiogram is a procedure that uses painless sound waves (ultrasound) to produce an image of the heart. Images from an echocardiogram can provide important information about:  Signs of coronary artery disease (CAD).  Aneurysm detection. An aneurysm is a weak or damaged part of an artery wall that bulges out from the normal force of blood pumping through the body.  Heart size and shape. Changes in the size or shape of the heart can be associated with certain conditions, including heart failure, aneurysm, and CAD.  Heart muscle function.  Heart valve function.  Signs of a past heart attack.  Fluid buildup around the heart.  Thickening of the heart muscle.  A tumor or infectious growth around the heart valves. Tell a health care provider about:  Any allergies you have.  All medicines you are taking, including vitamins, herbs, eye drops, creams, and over-the-counter medicines.  Any blood disorders you have.  Any surgeries you have had.  Any medical conditions you have.  Whether you are pregnant or may be pregnant. What are the risks? Generally, this is a safe procedure. However, problems may occur, including:  Allergic reaction to dye (contrast) that may be used during the procedure. What happens before the procedure? No specific preparation is needed. You may eat and drink normally. What happens during the procedure?   An IV tube may be inserted into one of your veins.  You may receive contrast through this tube. A contrast is an injection that  improves the quality of the pictures from your heart.  A gel will be applied to your chest.  A wand-like tool (transducer) will be moved over your chest. The gel will help to transmit the sound waves from the transducer.  The sound waves will harmlessly bounce off of your heart to allow the heart images to be captured in real-time motion. The images will be recorded on a computer. The procedure may vary among health care providers and hospitals. What happens after the procedure?  You may return to your normal, everyday life, including diet, activities, and medicines, unless your health care provider tells you not to do that. Summary  An echocardiogram is a procedure that uses painless sound waves (ultrasound) to produce an image of the heart.  Images from an echocardiogram can provide important information about the size and shape of your heart, heart muscle function, heart valve function, and fluid buildup around your heart.  You do not need to do anything to prepare before this procedure. You may eat and drink normally.  After the echocardiogram is completed, you may return to your normal, everyday life, unless your health care provider tells you not to do that. This information is not intended to replace advice given to you by your health care provider. Make sure you discuss any questions you have with your health care provider. Document Revised: 02/03/2019 Document Reviewed: 11/15/2016 Elsevier Patient Education  [place].

## 2020-05-01 NOTE — Progress Notes (Signed)
Remote pacemaker transmission.   

## 2020-05-07 DIAGNOSIS — F418 Other specified anxiety disorders: Secondary | ICD-10-CM | POA: Diagnosis not present

## 2020-05-07 DIAGNOSIS — I959 Hypotension, unspecified: Secondary | ICD-10-CM | POA: Diagnosis not present

## 2020-05-07 DIAGNOSIS — I714 Abdominal aortic aneurysm, without rupture: Secondary | ICD-10-CM | POA: Diagnosis not present

## 2020-05-07 DIAGNOSIS — I10 Essential (primary) hypertension: Secondary | ICD-10-CM | POA: Diagnosis not present

## 2020-05-07 DIAGNOSIS — I2510 Atherosclerotic heart disease of native coronary artery without angina pectoris: Secondary | ICD-10-CM | POA: Diagnosis not present

## 2020-05-07 DIAGNOSIS — G2581 Restless legs syndrome: Secondary | ICD-10-CM | POA: Diagnosis not present

## 2020-05-07 DIAGNOSIS — I4891 Unspecified atrial fibrillation: Secondary | ICD-10-CM | POA: Diagnosis not present

## 2020-05-07 DIAGNOSIS — M19011 Primary osteoarthritis, right shoulder: Secondary | ICD-10-CM | POA: Diagnosis not present

## 2020-05-07 DIAGNOSIS — M810 Age-related osteoporosis without current pathological fracture: Secondary | ICD-10-CM | POA: Diagnosis not present

## 2020-05-08 NOTE — Progress Notes (Deleted)
Electrophysiology Office Note Date: 05/08/2020  ID:  Nichole Nelson, DOB 02/14/1935, MRN [id number]  PCP: Nichole Johns, MD Primary Cardiologist: No primary care provider on file. Electrophysiologist: Nichole Meredith Leeds, MD   CC: Pacemaker follow-up  Nichole Nelson is a 85 y.o. female seen today for Nichole Meredith Leeds, MD for routine electrophysiology followup.  Since last being seen in our clinic the patient reports doing ***.  she denies chest pain, palpitations, dyspnea, PND, orthopnea, nausea, vomiting, dizziness, syncope, edema, weight gain, or early satiety.  Device History: Medtronic BiV PPM implanted 06/2014 for uncontrolled AF with AV nodal ablation  Past Medical History:  Diagnosis Date  . Abdominal fibromatosis   . Anemia   . Anxiety   . Chronic respiratory failure ([place])   . COPD (chronic obstructive pulmonary disease) ([place])   . CVA (cerebral vascular accident) ([place])   . H/O acute pancreatitis   . History of myocardial infarction   . Hyperlipidemia   . Hypertension   . Thyroid disease    Past Surgical History:  Procedure Laterality Date  . ABDOMINAL HYSTERECTOMY    . AORTIC VALVE REPAIR    . APPENDECTOMY    . CORONARY ARTERY BYPASS GRAFT    . ESOPHAGOGASTRODUODENOSCOPY  01/26/2014   Moderate hiatal hernia. presbyesophagus. No evidence of upper GI bleeding.   . ESOPHAGOGASTRODUODENOSCOPY  01/26/2014   Moderate hiatal hernia. Presbyesophagus. No evidene of upper Gi bleeding.   Marland Kitchen HERNIA REPAIR    . INSERT / REPLACE / REMOVE PACEMAKER     Medtronic    Current Outpatient Medications  Medication Sig Dispense Refill  . CARTIA XT 120 MG 24 hr capsule Take 1 tablet by mouth daily. (Patient not taking: Reported on 05/01/2020)    . Cholecalciferol (VITAMIN D3) 2000 units capsule Take 1 capsule by mouth daily.    . clonazePAM (KLONOPIN) 0.5 MG tablet Take 0.5 mg by mouth 2 (two) times daily.    Marland Kitchen dexlansoprazole (DEXILANT) 60 MG capsule Take 60 mg by  mouth daily.    Marland Kitchen diltiazem (TIAZAC) 120 MG 24 hr capsule Take 1 capsule by mouth daily. (Patient not taking: Reported on 05/01/2020)    . ELIQUIS 2.5 MG TABS tablet TAKE 1 TABLET BY MOUTH TWICE DAILY 60 tablet 5  . escitalopram (LEXAPRO) 10 MG tablet Take 10 mg by mouth daily.    . ferrous sulfate 325 (65 FE) MG tablet Take 325 mg by mouth 2 (two) times daily with a meal.  (Patient not taking: Reported on 05/01/2020)    . fluticasone furoate-vilanterol (BREO ELLIPTA) 100-25 MCG/INH AEPB Inhale 1 puff into the lungs daily.    Marland Kitchen gabapentin (NEURONTIN) 100 MG capsule Take 100 mg by mouth 4 (four) times daily.    Marland Kitchen levothyroxine (SYNTHROID, LEVOTHROID) 88 MCG tablet Take 1 tablet by mouth daily.    Marland Kitchen losartan (COZAAR) 50 MG tablet Take 1 tablet by mouth daily.    . mometasone (NASONEX) 50 MCG/ACT nasal spray Place 1 spray into both nostrils daily.    . montelukast (SINGULAIR) 10 MG tablet Take 10 mg by mouth daily.    . Omega-3 Fatty Acids (FISH OIL) 1000 MG CAPS Take 2,000 mg by mouth 2 (two) times daily.    . ondansetron (ZOFRAN) 4 MG tablet Take 4 mg by mouth every 8 (eight) hours as needed for nausea/vomiting.    . pravastatin (PRAVACHOL) 80 MG tablet Take 80 mg by mouth daily.    Marland Kitchen rOPINIRole (REQUIP) 0.5 MG tablet  Take 1 tablet by mouth daily. (Patient not taking: Reported on 05/01/2020)    . traMADol (ULTRAM) 50 MG tablet Take 100 mg by mouth every 8 (eight) hours as needed for pain.     No current facility-administered medications for this visit.    Allergies:   Iodinated diagnostic agents; Antihistamines, chlorpheniramine-type; and Meperidine   Social History: Social History   Socioeconomic History  . Marital status: Widowed    Spouse name: Not on file  . Number of children: Not on file  . Years of education: Not on file  . Highest education level: Not on file  Occupational History  . Not on file  Tobacco Use  . Smoking status: Never Smoker  . Smokeless tobacco: Never Used  Vaping  Use  . Vaping Use: Never used  Substance and Sexual Activity  . Alcohol use: No  . Drug use: No  . Sexual activity: Not on file  Other Topics Concern  . Not on file  Social History Narrative  . Not on file   Social Determinants of Health   Financial Resource Strain:   . Difficulty of Paying Living Expenses:   Food Insecurity:   . Worried About Charity fundraiser in the Last Year:   . Arboriculturist in the Last Year:   Transportation Needs:   . Film/video editor (Medical):   Marland Kitchen Lack of Transportation (Non-Medical):   Physical Activity:   . Days of Exercise per Week:   . Minutes of Exercise per Session:   Stress:   . Feeling of Stress :   Social Connections:   . Frequency of Communication with Friends and Family:   . Frequency of Social Gatherings with Friends and Family:   . Attends Religious Services:   . Active Member of Clubs or Organizations:   . Attends Archivist Meetings:   Marland Kitchen Marital Status:   Intimate Partner Violence:   . Fear of Current or Ex-Partner:   . Emotionally Abused:   Marland Kitchen Physically Abused:   . Sexually Abused:     Family History: Family History  Problem Relation Age of Onset  . Stroke Mother   . Kidney failure Mother   . Aneurysm Father   . Colon cancer Neg Hx      Review of Systems: All other systems reviewed and are otherwise negative except as noted above.  Physical Exam: There were no vitals filed for this visit.   GEN- The patient is well appearing, alert and oriented x 3 today.   HEENT: normocephalic, atraumatic; sclera clear, conjunctiva pink; hearing intact; oropharynx clear; neck supple  Lungs- Clear to ausculation bilaterally, normal work of breathing.  No wheezes, rales, rhonchi Heart- Regular rate and rhythm, no murmurs, rubs or gallops  GI- soft, non-tender, non-distended, bowel sounds present  Extremities- no clubbing, cyanosis, or edema  MS- no significant deformity or atrophy Skin- warm and dry, no rash or  lesion; PPM pocket well healed Psych- euthymic mood, full affect Neuro- strength and sensation are intact  PPM Interrogation- reviewed in detail today,  See PACEART report  EKG:  EKG {ACTION; IS/IS TDS:28768115} ordered today. The ekg ordered today shows ***  Recent Labs: No results found for requested labs within last 8760 hours.   Wt Readings from Last 3 Encounters:  05/01/20 120 lb (54.4 kg)  10/04/19 120 lb (54.4 kg)  02/07/19 115 lb (52.2 kg)     Other studies Reviewed: Additional studies/ records that were reviewed today  include: Previous EP office notes, Previous remote checks, Most recent labwork.   Assessment and Plan:  1. Tachy-Brady syndrome s/p AV nodal ablation and  Medtronic CRT-P  Normal PPM function See Pace Art report No changes today  2. Permanent AF Continue eliquis for CHA2DS2VASC of at least 7    3. HTN Continue current medication  4. HLD Continue pravastatin   Current medicines are reviewed at length with the patient today.   The patient {ACTIONS; HAS/DOES NOT HAVE:19233} concerns regarding her medicines.  The following changes were made today:  {NONE DEFAULTED:18576::"none"}  Labs/ tests ordered today include: *** No orders of the defined types were placed in this encounter.    Disposition:   Follow up with {Blank single:19197::"Dr. Allred","Dr. Arlan Organ. Klein","Dr. Camnitz","EP APP"} in *** {Blank single:19197::"Months","Weeks"}    Signed, Shirley Friar, PA-C  05/08/2020 3:25 PM  [place]  [address] [phone] (office) [phone] (fax)

## 2020-05-09 ENCOUNTER — Encounter: Payer: Medicare HMO | Admitting: Student

## 2020-05-09 DIAGNOSIS — G2581 Restless legs syndrome: Secondary | ICD-10-CM | POA: Diagnosis not present

## 2020-05-09 DIAGNOSIS — I10 Essential (primary) hypertension: Secondary | ICD-10-CM | POA: Diagnosis not present

## 2020-05-09 DIAGNOSIS — I2510 Atherosclerotic heart disease of native coronary artery without angina pectoris: Secondary | ICD-10-CM | POA: Diagnosis not present

## 2020-05-09 DIAGNOSIS — F418 Other specified anxiety disorders: Secondary | ICD-10-CM | POA: Diagnosis not present

## 2020-05-09 DIAGNOSIS — M810 Age-related osteoporosis without current pathological fracture: Secondary | ICD-10-CM | POA: Diagnosis not present

## 2020-05-09 DIAGNOSIS — I714 Abdominal aortic aneurysm, without rupture: Secondary | ICD-10-CM | POA: Diagnosis not present

## 2020-05-09 DIAGNOSIS — I4891 Unspecified atrial fibrillation: Secondary | ICD-10-CM | POA: Diagnosis not present

## 2020-05-09 DIAGNOSIS — M19011 Primary osteoarthritis, right shoulder: Secondary | ICD-10-CM | POA: Diagnosis not present

## 2020-05-09 DIAGNOSIS — I959 Hypotension, unspecified: Secondary | ICD-10-CM | POA: Diagnosis not present

## 2020-05-10 DIAGNOSIS — I714 Abdominal aortic aneurysm, without rupture: Secondary | ICD-10-CM | POA: Diagnosis not present

## 2020-05-10 DIAGNOSIS — M810 Age-related osteoporosis without current pathological fracture: Secondary | ICD-10-CM | POA: Diagnosis not present

## 2020-05-10 DIAGNOSIS — I2510 Atherosclerotic heart disease of native coronary artery without angina pectoris: Secondary | ICD-10-CM | POA: Diagnosis not present

## 2020-05-10 DIAGNOSIS — F418 Other specified anxiety disorders: Secondary | ICD-10-CM | POA: Diagnosis not present

## 2020-05-10 DIAGNOSIS — M19011 Primary osteoarthritis, right shoulder: Secondary | ICD-10-CM | POA: Diagnosis not present

## 2020-05-10 DIAGNOSIS — I959 Hypotension, unspecified: Secondary | ICD-10-CM | POA: Diagnosis not present

## 2020-05-10 DIAGNOSIS — I10 Essential (primary) hypertension: Secondary | ICD-10-CM | POA: Diagnosis not present

## 2020-05-10 DIAGNOSIS — G2581 Restless legs syndrome: Secondary | ICD-10-CM | POA: Diagnosis not present

## 2020-05-10 DIAGNOSIS — I4891 Unspecified atrial fibrillation: Secondary | ICD-10-CM | POA: Diagnosis not present

## 2020-05-15 DIAGNOSIS — N3281 Overactive bladder: Secondary | ICD-10-CM | POA: Diagnosis not present

## 2020-05-16 DIAGNOSIS — Z139 Encounter for screening, unspecified: Secondary | ICD-10-CM | POA: Diagnosis not present

## 2020-05-16 DIAGNOSIS — I4891 Unspecified atrial fibrillation: Secondary | ICD-10-CM | POA: Diagnosis not present

## 2020-05-16 DIAGNOSIS — Z1331 Encounter for screening for depression: Secondary | ICD-10-CM | POA: Diagnosis not present

## 2020-05-16 DIAGNOSIS — I959 Hypotension, unspecified: Secondary | ICD-10-CM | POA: Diagnosis not present

## 2020-05-16 DIAGNOSIS — M810 Age-related osteoporosis without current pathological fracture: Secondary | ICD-10-CM | POA: Diagnosis not present

## 2020-05-16 DIAGNOSIS — I10 Essential (primary) hypertension: Secondary | ICD-10-CM | POA: Diagnosis not present

## 2020-05-16 DIAGNOSIS — I2510 Atherosclerotic heart disease of native coronary artery without angina pectoris: Secondary | ICD-10-CM | POA: Diagnosis not present

## 2020-05-16 DIAGNOSIS — G2581 Restless legs syndrome: Secondary | ICD-10-CM | POA: Diagnosis not present

## 2020-05-16 DIAGNOSIS — F418 Other specified anxiety disorders: Secondary | ICD-10-CM | POA: Diagnosis not present

## 2020-05-16 DIAGNOSIS — Z0000 Encounter for general adult medical examination without abnormal findings: Secondary | ICD-10-CM | POA: Diagnosis not present

## 2020-05-16 DIAGNOSIS — Z9181 History of falling: Secondary | ICD-10-CM | POA: Diagnosis not present

## 2020-05-16 DIAGNOSIS — E785 Hyperlipidemia, unspecified: Secondary | ICD-10-CM | POA: Diagnosis not present

## 2020-05-16 DIAGNOSIS — M19011 Primary osteoarthritis, right shoulder: Secondary | ICD-10-CM | POA: Diagnosis not present

## 2020-05-16 DIAGNOSIS — I714 Abdominal aortic aneurysm, without rupture: Secondary | ICD-10-CM | POA: Diagnosis not present

## 2020-05-17 ENCOUNTER — Other Ambulatory Visit: Payer: Medicare HMO

## 2020-05-18 DIAGNOSIS — I2510 Atherosclerotic heart disease of native coronary artery without angina pectoris: Secondary | ICD-10-CM | POA: Diagnosis not present

## 2020-05-18 DIAGNOSIS — M19011 Primary osteoarthritis, right shoulder: Secondary | ICD-10-CM | POA: Diagnosis not present

## 2020-05-18 DIAGNOSIS — F418 Other specified anxiety disorders: Secondary | ICD-10-CM | POA: Diagnosis not present

## 2020-05-18 DIAGNOSIS — G2581 Restless legs syndrome: Secondary | ICD-10-CM | POA: Diagnosis not present

## 2020-05-18 DIAGNOSIS — M810 Age-related osteoporosis without current pathological fracture: Secondary | ICD-10-CM | POA: Diagnosis not present

## 2020-05-18 DIAGNOSIS — I10 Essential (primary) hypertension: Secondary | ICD-10-CM | POA: Diagnosis not present

## 2020-05-18 DIAGNOSIS — I4891 Unspecified atrial fibrillation: Secondary | ICD-10-CM | POA: Diagnosis not present

## 2020-05-18 DIAGNOSIS — I959 Hypotension, unspecified: Secondary | ICD-10-CM | POA: Diagnosis not present

## 2020-05-18 DIAGNOSIS — I714 Abdominal aortic aneurysm, without rupture: Secondary | ICD-10-CM | POA: Diagnosis not present

## 2020-05-22 DIAGNOSIS — M19011 Primary osteoarthritis, right shoulder: Secondary | ICD-10-CM | POA: Diagnosis not present

## 2020-05-22 DIAGNOSIS — M810 Age-related osteoporosis without current pathological fracture: Secondary | ICD-10-CM | POA: Diagnosis not present

## 2020-05-22 DIAGNOSIS — I4891 Unspecified atrial fibrillation: Secondary | ICD-10-CM | POA: Diagnosis not present

## 2020-05-22 DIAGNOSIS — F418 Other specified anxiety disorders: Secondary | ICD-10-CM | POA: Diagnosis not present

## 2020-05-22 DIAGNOSIS — I959 Hypotension, unspecified: Secondary | ICD-10-CM | POA: Diagnosis not present

## 2020-05-22 DIAGNOSIS — I2510 Atherosclerotic heart disease of native coronary artery without angina pectoris: Secondary | ICD-10-CM | POA: Diagnosis not present

## 2020-05-22 DIAGNOSIS — I10 Essential (primary) hypertension: Secondary | ICD-10-CM | POA: Diagnosis not present

## 2020-05-22 DIAGNOSIS — G2581 Restless legs syndrome: Secondary | ICD-10-CM | POA: Diagnosis not present

## 2020-05-22 DIAGNOSIS — I714 Abdominal aortic aneurysm, without rupture: Secondary | ICD-10-CM | POA: Diagnosis not present

## 2020-05-23 DIAGNOSIS — Z8679 Personal history of other diseases of the circulatory system: Secondary | ICD-10-CM | POA: Diagnosis not present

## 2020-05-23 DIAGNOSIS — G250 Essential tremor: Secondary | ICD-10-CM | POA: Diagnosis not present

## 2020-05-23 DIAGNOSIS — M159 Polyosteoarthritis, unspecified: Secondary | ICD-10-CM | POA: Diagnosis not present

## 2020-05-23 DIAGNOSIS — K219 Gastro-esophageal reflux disease without esophagitis: Secondary | ICD-10-CM | POA: Diagnosis not present

## 2020-05-23 DIAGNOSIS — I10 Essential (primary) hypertension: Secondary | ICD-10-CM | POA: Diagnosis not present

## 2020-05-23 DIAGNOSIS — M545 Low back pain: Secondary | ICD-10-CM | POA: Diagnosis not present

## 2020-05-23 DIAGNOSIS — G2581 Restless legs syndrome: Secondary | ICD-10-CM | POA: Diagnosis not present

## 2020-05-23 DIAGNOSIS — J309 Allergic rhinitis, unspecified: Secondary | ICD-10-CM | POA: Diagnosis not present

## 2020-05-24 DIAGNOSIS — M19011 Primary osteoarthritis, right shoulder: Secondary | ICD-10-CM | POA: Diagnosis not present

## 2020-05-24 DIAGNOSIS — M810 Age-related osteoporosis without current pathological fracture: Secondary | ICD-10-CM | POA: Diagnosis not present

## 2020-05-24 DIAGNOSIS — I714 Abdominal aortic aneurysm, without rupture: Secondary | ICD-10-CM | POA: Diagnosis not present

## 2020-05-24 DIAGNOSIS — F418 Other specified anxiety disorders: Secondary | ICD-10-CM | POA: Diagnosis not present

## 2020-05-24 DIAGNOSIS — I959 Hypotension, unspecified: Secondary | ICD-10-CM | POA: Diagnosis not present

## 2020-05-24 DIAGNOSIS — I2510 Atherosclerotic heart disease of native coronary artery without angina pectoris: Secondary | ICD-10-CM | POA: Diagnosis not present

## 2020-05-24 DIAGNOSIS — G2581 Restless legs syndrome: Secondary | ICD-10-CM | POA: Diagnosis not present

## 2020-05-24 DIAGNOSIS — I10 Essential (primary) hypertension: Secondary | ICD-10-CM | POA: Diagnosis not present

## 2020-05-24 DIAGNOSIS — I4891 Unspecified atrial fibrillation: Secondary | ICD-10-CM | POA: Diagnosis not present

## 2020-05-29 DIAGNOSIS — I959 Hypotension, unspecified: Secondary | ICD-10-CM | POA: Diagnosis not present

## 2020-05-29 DIAGNOSIS — I714 Abdominal aortic aneurysm, without rupture: Secondary | ICD-10-CM | POA: Diagnosis not present

## 2020-05-29 DIAGNOSIS — F418 Other specified anxiety disorders: Secondary | ICD-10-CM | POA: Diagnosis not present

## 2020-05-29 DIAGNOSIS — I2510 Atherosclerotic heart disease of native coronary artery without angina pectoris: Secondary | ICD-10-CM | POA: Diagnosis not present

## 2020-05-29 DIAGNOSIS — M19011 Primary osteoarthritis, right shoulder: Secondary | ICD-10-CM | POA: Diagnosis not present

## 2020-05-29 DIAGNOSIS — G2581 Restless legs syndrome: Secondary | ICD-10-CM | POA: Diagnosis not present

## 2020-05-29 DIAGNOSIS — I4891 Unspecified atrial fibrillation: Secondary | ICD-10-CM | POA: Diagnosis not present

## 2020-05-29 DIAGNOSIS — M810 Age-related osteoporosis without current pathological fracture: Secondary | ICD-10-CM | POA: Diagnosis not present

## 2020-05-29 DIAGNOSIS — I10 Essential (primary) hypertension: Secondary | ICD-10-CM | POA: Diagnosis not present

## 2020-05-31 DIAGNOSIS — G2581 Restless legs syndrome: Secondary | ICD-10-CM | POA: Diagnosis not present

## 2020-05-31 DIAGNOSIS — M810 Age-related osteoporosis without current pathological fracture: Secondary | ICD-10-CM | POA: Diagnosis not present

## 2020-05-31 DIAGNOSIS — I4891 Unspecified atrial fibrillation: Secondary | ICD-10-CM | POA: Diagnosis not present

## 2020-05-31 DIAGNOSIS — F418 Other specified anxiety disorders: Secondary | ICD-10-CM | POA: Diagnosis not present

## 2020-05-31 DIAGNOSIS — I10 Essential (primary) hypertension: Secondary | ICD-10-CM | POA: Diagnosis not present

## 2020-05-31 DIAGNOSIS — I714 Abdominal aortic aneurysm, without rupture: Secondary | ICD-10-CM | POA: Diagnosis not present

## 2020-05-31 DIAGNOSIS — I2510 Atherosclerotic heart disease of native coronary artery without angina pectoris: Secondary | ICD-10-CM | POA: Diagnosis not present

## 2020-05-31 DIAGNOSIS — I959 Hypotension, unspecified: Secondary | ICD-10-CM | POA: Diagnosis not present

## 2020-05-31 DIAGNOSIS — M19011 Primary osteoarthritis, right shoulder: Secondary | ICD-10-CM | POA: Diagnosis not present

## 2020-06-04 DIAGNOSIS — I10 Essential (primary) hypertension: Secondary | ICD-10-CM | POA: Diagnosis not present

## 2020-06-04 DIAGNOSIS — I959 Hypotension, unspecified: Secondary | ICD-10-CM | POA: Diagnosis not present

## 2020-06-04 DIAGNOSIS — G2581 Restless legs syndrome: Secondary | ICD-10-CM | POA: Diagnosis not present

## 2020-06-04 DIAGNOSIS — I714 Abdominal aortic aneurysm, without rupture: Secondary | ICD-10-CM | POA: Diagnosis not present

## 2020-06-04 DIAGNOSIS — M810 Age-related osteoporosis without current pathological fracture: Secondary | ICD-10-CM | POA: Diagnosis not present

## 2020-06-04 DIAGNOSIS — M19011 Primary osteoarthritis, right shoulder: Secondary | ICD-10-CM | POA: Diagnosis not present

## 2020-06-04 DIAGNOSIS — F418 Other specified anxiety disorders: Secondary | ICD-10-CM | POA: Diagnosis not present

## 2020-06-04 DIAGNOSIS — I4891 Unspecified atrial fibrillation: Secondary | ICD-10-CM | POA: Diagnosis not present

## 2020-06-04 DIAGNOSIS — I2510 Atherosclerotic heart disease of native coronary artery without angina pectoris: Secondary | ICD-10-CM | POA: Diagnosis not present

## 2020-06-06 DIAGNOSIS — I4891 Unspecified atrial fibrillation: Secondary | ICD-10-CM | POA: Diagnosis not present

## 2020-06-06 DIAGNOSIS — I10 Essential (primary) hypertension: Secondary | ICD-10-CM | POA: Diagnosis not present

## 2020-06-06 DIAGNOSIS — I959 Hypotension, unspecified: Secondary | ICD-10-CM | POA: Diagnosis not present

## 2020-06-06 DIAGNOSIS — M810 Age-related osteoporosis without current pathological fracture: Secondary | ICD-10-CM | POA: Diagnosis not present

## 2020-06-06 DIAGNOSIS — M19011 Primary osteoarthritis, right shoulder: Secondary | ICD-10-CM | POA: Diagnosis not present

## 2020-06-06 DIAGNOSIS — G2581 Restless legs syndrome: Secondary | ICD-10-CM | POA: Diagnosis not present

## 2020-06-06 DIAGNOSIS — I714 Abdominal aortic aneurysm, without rupture: Secondary | ICD-10-CM | POA: Diagnosis not present

## 2020-06-06 DIAGNOSIS — F418 Other specified anxiety disorders: Secondary | ICD-10-CM | POA: Diagnosis not present

## 2020-06-06 DIAGNOSIS — I2510 Atherosclerotic heart disease of native coronary artery without angina pectoris: Secondary | ICD-10-CM | POA: Diagnosis not present

## 2020-06-11 DIAGNOSIS — I10 Essential (primary) hypertension: Secondary | ICD-10-CM | POA: Diagnosis not present

## 2020-06-11 DIAGNOSIS — I959 Hypotension, unspecified: Secondary | ICD-10-CM | POA: Diagnosis not present

## 2020-06-11 DIAGNOSIS — M810 Age-related osteoporosis without current pathological fracture: Secondary | ICD-10-CM | POA: Diagnosis not present

## 2020-06-11 DIAGNOSIS — F418 Other specified anxiety disorders: Secondary | ICD-10-CM | POA: Diagnosis not present

## 2020-06-11 DIAGNOSIS — I714 Abdominal aortic aneurysm, without rupture: Secondary | ICD-10-CM | POA: Diagnosis not present

## 2020-06-11 DIAGNOSIS — M19011 Primary osteoarthritis, right shoulder: Secondary | ICD-10-CM | POA: Diagnosis not present

## 2020-06-11 DIAGNOSIS — I2510 Atherosclerotic heart disease of native coronary artery without angina pectoris: Secondary | ICD-10-CM | POA: Diagnosis not present

## 2020-06-11 DIAGNOSIS — I4891 Unspecified atrial fibrillation: Secondary | ICD-10-CM | POA: Diagnosis not present

## 2020-06-11 DIAGNOSIS — G2581 Restless legs syndrome: Secondary | ICD-10-CM | POA: Diagnosis not present

## 2020-06-13 DIAGNOSIS — N3281 Overactive bladder: Secondary | ICD-10-CM | POA: Diagnosis not present

## 2020-06-21 DIAGNOSIS — I714 Abdominal aortic aneurysm, without rupture: Secondary | ICD-10-CM | POA: Diagnosis not present

## 2020-06-21 DIAGNOSIS — G2581 Restless legs syndrome: Secondary | ICD-10-CM | POA: Diagnosis not present

## 2020-06-21 DIAGNOSIS — M810 Age-related osteoporosis without current pathological fracture: Secondary | ICD-10-CM | POA: Diagnosis not present

## 2020-06-21 DIAGNOSIS — F418 Other specified anxiety disorders: Secondary | ICD-10-CM | POA: Diagnosis not present

## 2020-06-21 DIAGNOSIS — I959 Hypotension, unspecified: Secondary | ICD-10-CM | POA: Diagnosis not present

## 2020-06-21 DIAGNOSIS — I10 Essential (primary) hypertension: Secondary | ICD-10-CM | POA: Diagnosis not present

## 2020-06-21 DIAGNOSIS — M19011 Primary osteoarthritis, right shoulder: Secondary | ICD-10-CM | POA: Diagnosis not present

## 2020-06-21 DIAGNOSIS — I2510 Atherosclerotic heart disease of native coronary artery without angina pectoris: Secondary | ICD-10-CM | POA: Diagnosis not present

## 2020-06-21 DIAGNOSIS — I4891 Unspecified atrial fibrillation: Secondary | ICD-10-CM | POA: Diagnosis not present

## 2020-06-22 DIAGNOSIS — E559 Vitamin D deficiency, unspecified: Secondary | ICD-10-CM | POA: Diagnosis not present

## 2020-06-22 DIAGNOSIS — R260 Ataxic gait: Secondary | ICD-10-CM | POA: Diagnosis not present

## 2020-06-22 DIAGNOSIS — E785 Hyperlipidemia, unspecified: Secondary | ICD-10-CM | POA: Diagnosis not present

## 2020-06-22 DIAGNOSIS — F418 Other specified anxiety disorders: Secondary | ICD-10-CM | POA: Diagnosis not present

## 2020-06-22 DIAGNOSIS — D649 Anemia, unspecified: Secondary | ICD-10-CM | POA: Diagnosis not present

## 2020-06-22 DIAGNOSIS — M545 Low back pain: Secondary | ICD-10-CM | POA: Diagnosis not present

## 2020-06-27 DIAGNOSIS — M19011 Primary osteoarthritis, right shoulder: Secondary | ICD-10-CM | POA: Diagnosis not present

## 2020-06-27 DIAGNOSIS — F418 Other specified anxiety disorders: Secondary | ICD-10-CM | POA: Diagnosis not present

## 2020-06-27 DIAGNOSIS — I2510 Atherosclerotic heart disease of native coronary artery without angina pectoris: Secondary | ICD-10-CM | POA: Diagnosis not present

## 2020-06-27 DIAGNOSIS — M810 Age-related osteoporosis without current pathological fracture: Secondary | ICD-10-CM | POA: Diagnosis not present

## 2020-06-27 DIAGNOSIS — G2581 Restless legs syndrome: Secondary | ICD-10-CM | POA: Diagnosis not present

## 2020-06-27 DIAGNOSIS — I4891 Unspecified atrial fibrillation: Secondary | ICD-10-CM | POA: Diagnosis not present

## 2020-06-27 DIAGNOSIS — I959 Hypotension, unspecified: Secondary | ICD-10-CM | POA: Diagnosis not present

## 2020-06-27 DIAGNOSIS — I10 Essential (primary) hypertension: Secondary | ICD-10-CM | POA: Diagnosis not present

## 2020-06-27 DIAGNOSIS — I714 Abdominal aortic aneurysm, without rupture: Secondary | ICD-10-CM | POA: Diagnosis not present

## 2020-07-05 DIAGNOSIS — G2581 Restless legs syndrome: Secondary | ICD-10-CM | POA: Diagnosis not present

## 2020-07-05 DIAGNOSIS — M19011 Primary osteoarthritis, right shoulder: Secondary | ICD-10-CM | POA: Diagnosis not present

## 2020-07-05 DIAGNOSIS — F418 Other specified anxiety disorders: Secondary | ICD-10-CM | POA: Diagnosis not present

## 2020-07-05 DIAGNOSIS — I4891 Unspecified atrial fibrillation: Secondary | ICD-10-CM | POA: Diagnosis not present

## 2020-07-05 DIAGNOSIS — I714 Abdominal aortic aneurysm, without rupture: Secondary | ICD-10-CM | POA: Diagnosis not present

## 2020-07-05 DIAGNOSIS — M810 Age-related osteoporosis without current pathological fracture: Secondary | ICD-10-CM | POA: Diagnosis not present

## 2020-07-05 DIAGNOSIS — I10 Essential (primary) hypertension: Secondary | ICD-10-CM | POA: Diagnosis not present

## 2020-07-05 DIAGNOSIS — I2510 Atherosclerotic heart disease of native coronary artery without angina pectoris: Secondary | ICD-10-CM | POA: Diagnosis not present

## 2020-07-05 DIAGNOSIS — I959 Hypotension, unspecified: Secondary | ICD-10-CM | POA: Diagnosis not present

## 2020-07-16 DIAGNOSIS — N3281 Overactive bladder: Secondary | ICD-10-CM | POA: Diagnosis not present

## 2020-07-19 DIAGNOSIS — F418 Other specified anxiety disorders: Secondary | ICD-10-CM | POA: Diagnosis not present

## 2020-07-19 DIAGNOSIS — G2581 Restless legs syndrome: Secondary | ICD-10-CM | POA: Diagnosis not present

## 2020-07-19 DIAGNOSIS — M810 Age-related osteoporosis without current pathological fracture: Secondary | ICD-10-CM | POA: Diagnosis not present

## 2020-07-19 DIAGNOSIS — E039 Hypothyroidism, unspecified: Secondary | ICD-10-CM | POA: Diagnosis not present

## 2020-07-19 DIAGNOSIS — M159 Polyosteoarthritis, unspecified: Secondary | ICD-10-CM | POA: Diagnosis not present

## 2020-07-19 DIAGNOSIS — J309 Allergic rhinitis, unspecified: Secondary | ICD-10-CM | POA: Diagnosis not present

## 2020-07-27 ENCOUNTER — Ambulatory Visit (INDEPENDENT_AMBULATORY_CARE_PROVIDER_SITE_OTHER): Payer: Medicare HMO

## 2020-07-27 DIAGNOSIS — I442 Atrioventricular block, complete: Secondary | ICD-10-CM | POA: Diagnosis not present

## 2020-07-27 DIAGNOSIS — I4821 Permanent atrial fibrillation: Secondary | ICD-10-CM

## 2020-07-29 LAB — CUP PACEART REMOTE DEVICE CHECK
Battery Remaining Longevity: 25 mo
Battery Voltage: 2.91 V
Brady Statistic AP VP Percent: 0 %
Brady Statistic AP VS Percent: 0 %
Brady Statistic AS VP Percent: 100 %
Brady Statistic AS VS Percent: 0 %
Brady Statistic RA Percent Paced: 0 %
Brady Statistic RV Percent Paced: 99.94 %
Date Time Interrogation Session: 20210930191935
Implantable Lead Implant Date: 20150916
Implantable Lead Implant Date: 20150916
Implantable Lead Implant Date: 20150916
Implantable Lead Location: 753859
Implantable Lead Location: 753860
Implantable Lead Location: 753860
Implantable Lead Model: 5076
Implantable Lead Model: 5076
Implantable Lead Model: 5076
Implantable Pulse Generator Implant Date: 20150916
Lead Channel Impedance Value: 247 Ohm
Lead Channel Impedance Value: 304 Ohm
Lead Channel Impedance Value: 323 Ohm
Lead Channel Impedance Value: 418 Ohm
Lead Channel Impedance Value: 437 Ohm
Lead Channel Impedance Value: 475 Ohm
Lead Channel Impedance Value: 589 Ohm
Lead Channel Impedance Value: 608 Ohm
Lead Channel Impedance Value: 646 Ohm
Lead Channel Pacing Threshold Amplitude: 0.625 V
Lead Channel Pacing Threshold Amplitude: 0.875 V
Lead Channel Pacing Threshold Amplitude: 0.875 V
Lead Channel Pacing Threshold Pulse Width: 0.4 ms
Lead Channel Pacing Threshold Pulse Width: 0.4 ms
Lead Channel Pacing Threshold Pulse Width: 0.4 ms
Lead Channel Sensing Intrinsic Amplitude: 3.75 mV
Lead Channel Sensing Intrinsic Amplitude: 3.75 mV
Lead Channel Sensing Intrinsic Amplitude: 6.625 mV
Lead Channel Sensing Intrinsic Amplitude: 6.625 mV
Lead Channel Setting Pacing Amplitude: 2 V
Lead Channel Setting Pacing Amplitude: 2.5 V
Lead Channel Setting Pacing Pulse Width: 0.4 ms
Lead Channel Setting Pacing Pulse Width: 0.4 ms
Lead Channel Setting Sensing Sensitivity: 4 mV

## 2020-07-30 NOTE — Progress Notes (Signed)
Remote pacemaker transmission.   

## 2020-08-17 DIAGNOSIS — M545 Low back pain, unspecified: Secondary | ICD-10-CM | POA: Diagnosis not present

## 2020-08-17 DIAGNOSIS — G2581 Restless legs syndrome: Secondary | ICD-10-CM | POA: Diagnosis not present

## 2020-08-17 DIAGNOSIS — I714 Abdominal aortic aneurysm, without rupture: Secondary | ICD-10-CM | POA: Diagnosis not present

## 2020-08-17 DIAGNOSIS — K219 Gastro-esophageal reflux disease without esophagitis: Secondary | ICD-10-CM | POA: Diagnosis not present

## 2020-08-17 DIAGNOSIS — G250 Essential tremor: Secondary | ICD-10-CM | POA: Diagnosis not present

## 2020-08-17 DIAGNOSIS — I10 Essential (primary) hypertension: Secondary | ICD-10-CM | POA: Diagnosis not present

## 2020-08-23 DIAGNOSIS — N3281 Overactive bladder: Secondary | ICD-10-CM | POA: Diagnosis not present

## 2020-08-23 DIAGNOSIS — I639 Cerebral infarction, unspecified: Secondary | ICD-10-CM | POA: Diagnosis not present

## 2020-09-14 DIAGNOSIS — F418 Other specified anxiety disorders: Secondary | ICD-10-CM | POA: Diagnosis not present

## 2020-09-14 DIAGNOSIS — E785 Hyperlipidemia, unspecified: Secondary | ICD-10-CM | POA: Diagnosis not present

## 2020-09-14 DIAGNOSIS — I4891 Unspecified atrial fibrillation: Secondary | ICD-10-CM | POA: Diagnosis not present

## 2020-09-14 DIAGNOSIS — D649 Anemia, unspecified: Secondary | ICD-10-CM | POA: Diagnosis not present

## 2020-09-14 DIAGNOSIS — M545 Low back pain, unspecified: Secondary | ICD-10-CM | POA: Diagnosis not present

## 2020-09-23 DIAGNOSIS — I639 Cerebral infarction, unspecified: Secondary | ICD-10-CM | POA: Diagnosis not present

## 2020-09-23 DIAGNOSIS — N3281 Overactive bladder: Secondary | ICD-10-CM | POA: Diagnosis not present

## 2020-09-26 ENCOUNTER — Other Ambulatory Visit: Payer: Self-pay | Admitting: Hematology and Oncology

## 2020-09-26 DIAGNOSIS — D509 Iron deficiency anemia, unspecified: Secondary | ICD-10-CM

## 2020-09-26 DIAGNOSIS — N390 Urinary tract infection, site not specified: Secondary | ICD-10-CM | POA: Diagnosis not present

## 2020-10-09 ENCOUNTER — Other Ambulatory Visit: Payer: Self-pay | Admitting: Cardiology

## 2020-10-09 NOTE — Telephone Encounter (Signed)
Prescription refill request for Eliquis received. Indication: atrial fibrillation Last office visit:07/2020  camnitz Scr: 1.14  10/2019 Age: 85 Weight:54.4 kg  Prescription refilled

## 2020-10-11 ENCOUNTER — Other Ambulatory Visit: Payer: Self-pay | Admitting: Oncology

## 2020-10-11 DIAGNOSIS — D509 Iron deficiency anemia, unspecified: Secondary | ICD-10-CM

## 2020-10-11 NOTE — Progress Notes (Deleted)
  [facility]  [address] [place],  [place]  [address] [phone]  Clinic Day:  10/11/2020  Referring physician: Nicholos Johns, MD   HISTORY OF PRESENT ILLNESS:  The patient is a 85 y.o. female with iron deficiency anemia.  In the past, IV Feraheme was very effective in replenishing her iron stores and normalizing her hemoglobin.  She comes in today to reassess her iron and hemoglobin levels.  Overall, the patient claims to be doing okay.  She denies having increased fatigue or any overt forms of blood loss which concern her for having recurrent iron deficiency anemia.  PHYSICAL EXAM:  There were no vitals taken for this visit. Wt Readings from Last 3 Encounters:  05/01/20 120 lb (54.4 kg)  10/04/19 120 lb (54.4 kg)  02/07/19 115 lb (52.2 kg)   There is no height or weight on file to calculate BMI. Performance status (ECOG): {CHL ONC [id number] Physical Exam  LABS:   CBC Latest Ref Rng & Units 09/27/2018  WBC 4.0 - 10.5 K/uL 8.4  Hemoglobin 12.0 - 15.0 g/dL 12.3  Hematocrit 36.0 - 46.0 % 38.4  Platelets 150.0 - 400.0 K/uL 212.0   CMP Latest Ref Rng & Units 05/02/2019 09/27/2018 05/21/2017  Glucose 65 - 99 mg/dL 104(H) 87 113(H)  BUN 8 - 27 mg/dL 20 19 17   Creatinine 0.57 - 1.00 mg/dL 1.26(H) 0.99 1.15(H)  Sodium 134 - 144 mmol/L 134 140 138  Potassium 3.5 - 5.2 mmol/L 4.6 5.1 5.2  Chloride 96 - 106 mmol/L 96 105 100  CO2 20 - 29 mmol/L 21 25 24   Calcium 8.7 - 10.3 mg/dL 9.5 9.2 9.6  Total Protein 6.0 - 8.3 g/dL - 6.8 -  Total Bilirubin 0.2 - 1.2 mg/dL - 0.5 -  Alkaline Phos 39 - 117 U/L - 71 -  AST 0 - 37 U/L - 20 -  ALT 0 - 35 U/L - 15 -     No results found for: CEA1 / No results found for: CEA1 No results found for: PSA1 No results found for: WHQ759 No results found for: FMB846  No results found for: TOTALPROTELP, ALBUMINELP, A1GS, A2GS, BETS, BETA2SER, GAMS, MSPIKE, SPEI No results found for: TIBC, FERRITIN, IRONPCTSAT No  results found for: LDH  No results found for: AFPTUMOR, TOTALPROTELP, ALBUMINELP, A1GS, A2GS, BETS, BETA2SER, GAMS, MSPIKE, SPEI, LDH, CEA1, PSA1, IGASERUM, IGGSERUM, IGMSERUM, THGAB, THYROGLB  Recent Review Flowsheet Data   There is no flowsheet data to display.      STUDIES:  No results found.    ASSESSMENT & PLAN:   Assessment/Plan:  A 85 y.o. female with iron deficiency anemia.  I am pleased as her iron and hemoglobin levels remain ideal.  From a hematologic standpoint, the patient is doing well.  On another note, we will have physical therapy work with her over these next few weeks to get improve her strength and stamina.  Otherwise, I will see this patient back in 6 months for repeat clinical assessment.  The patient understands all the plans discussed today and is in agreement with them. The patient understands all the plans discussed today and is in agreement with them.      Nichole Oser Macarthur Critchley, MD

## 2020-10-12 ENCOUNTER — Inpatient Hospital Stay: Payer: Medicare HMO

## 2020-10-12 ENCOUNTER — Inpatient Hospital Stay: Payer: Medicare HMO | Admitting: Oncology

## 2020-10-15 DIAGNOSIS — M810 Age-related osteoporosis without current pathological fracture: Secondary | ICD-10-CM | POA: Diagnosis not present

## 2020-10-15 DIAGNOSIS — M159 Polyosteoarthritis, unspecified: Secondary | ICD-10-CM | POA: Diagnosis not present

## 2020-10-15 DIAGNOSIS — E559 Vitamin D deficiency, unspecified: Secondary | ICD-10-CM | POA: Diagnosis not present

## 2020-10-15 DIAGNOSIS — E039 Hypothyroidism, unspecified: Secondary | ICD-10-CM | POA: Diagnosis not present

## 2020-10-15 DIAGNOSIS — J309 Allergic rhinitis, unspecified: Secondary | ICD-10-CM | POA: Diagnosis not present

## 2020-10-15 DIAGNOSIS — F418 Other specified anxiety disorders: Secondary | ICD-10-CM | POA: Diagnosis not present

## 2020-10-22 DIAGNOSIS — I639 Cerebral infarction, unspecified: Secondary | ICD-10-CM | POA: Diagnosis not present

## 2020-10-22 DIAGNOSIS — N3281 Overactive bladder: Secondary | ICD-10-CM | POA: Diagnosis not present

## 2020-11-14 DIAGNOSIS — G2581 Restless legs syndrome: Secondary | ICD-10-CM | POA: Diagnosis not present

## 2020-11-14 DIAGNOSIS — K219 Gastro-esophageal reflux disease without esophagitis: Secondary | ICD-10-CM | POA: Diagnosis not present

## 2020-11-14 DIAGNOSIS — M545 Low back pain, unspecified: Secondary | ICD-10-CM | POA: Diagnosis not present

## 2020-11-14 DIAGNOSIS — G250 Essential tremor: Secondary | ICD-10-CM | POA: Diagnosis not present

## 2020-11-14 DIAGNOSIS — I714 Abdominal aortic aneurysm, without rupture: Secondary | ICD-10-CM | POA: Diagnosis not present

## 2020-11-14 DIAGNOSIS — I10 Essential (primary) hypertension: Secondary | ICD-10-CM | POA: Diagnosis not present

## 2020-11-19 DIAGNOSIS — I639 Cerebral infarction, unspecified: Secondary | ICD-10-CM | POA: Diagnosis not present

## 2020-11-19 DIAGNOSIS — N3281 Overactive bladder: Secondary | ICD-10-CM | POA: Diagnosis not present

## 2020-11-21 ENCOUNTER — Other Ambulatory Visit: Payer: Self-pay

## 2020-11-21 DIAGNOSIS — E079 Disorder of thyroid, unspecified: Secondary | ICD-10-CM | POA: Insufficient documentation

## 2020-11-21 DIAGNOSIS — D649 Anemia, unspecified: Secondary | ICD-10-CM | POA: Insufficient documentation

## 2020-11-21 DIAGNOSIS — D481 Neoplasm of uncertain behavior of connective and other soft tissue: Secondary | ICD-10-CM | POA: Insufficient documentation

## 2020-11-21 DIAGNOSIS — I10 Essential (primary) hypertension: Secondary | ICD-10-CM | POA: Insufficient documentation

## 2020-11-21 DIAGNOSIS — F419 Anxiety disorder, unspecified: Secondary | ICD-10-CM | POA: Insufficient documentation

## 2020-11-21 DIAGNOSIS — I252 Old myocardial infarction: Secondary | ICD-10-CM | POA: Insufficient documentation

## 2020-11-21 DIAGNOSIS — Z8719 Personal history of other diseases of the digestive system: Secondary | ICD-10-CM | POA: Insufficient documentation

## 2020-11-21 DIAGNOSIS — I639 Cerebral infarction, unspecified: Secondary | ICD-10-CM | POA: Insufficient documentation

## 2020-11-21 DIAGNOSIS — J9610 Chronic respiratory failure, unspecified whether with hypoxia or hypercapnia: Secondary | ICD-10-CM | POA: Insufficient documentation

## 2020-11-23 ENCOUNTER — Ambulatory Visit: Payer: Medicare HMO | Admitting: Cardiology

## 2020-12-14 DIAGNOSIS — G2581 Restless legs syndrome: Secondary | ICD-10-CM | POA: Diagnosis not present

## 2020-12-14 DIAGNOSIS — K219 Gastro-esophageal reflux disease without esophagitis: Secondary | ICD-10-CM | POA: Diagnosis not present

## 2020-12-14 DIAGNOSIS — Z79899 Other long term (current) drug therapy: Secondary | ICD-10-CM | POA: Diagnosis not present

## 2020-12-14 DIAGNOSIS — M545 Low back pain, unspecified: Secondary | ICD-10-CM | POA: Diagnosis not present

## 2020-12-14 DIAGNOSIS — E559 Vitamin D deficiency, unspecified: Secondary | ICD-10-CM | POA: Diagnosis not present

## 2020-12-14 DIAGNOSIS — I10 Essential (primary) hypertension: Secondary | ICD-10-CM | POA: Diagnosis not present

## 2020-12-14 DIAGNOSIS — I714 Abdominal aortic aneurysm, without rupture: Secondary | ICD-10-CM | POA: Diagnosis not present

## 2020-12-18 ENCOUNTER — Ambulatory Visit: Payer: Medicare HMO | Admitting: Cardiology

## 2020-12-18 DIAGNOSIS — I639 Cerebral infarction, unspecified: Secondary | ICD-10-CM | POA: Diagnosis not present

## 2020-12-18 DIAGNOSIS — N3281 Overactive bladder: Secondary | ICD-10-CM | POA: Diagnosis not present

## 2020-12-20 DIAGNOSIS — E559 Vitamin D deficiency, unspecified: Secondary | ICD-10-CM | POA: Diagnosis not present

## 2020-12-20 DIAGNOSIS — Z79899 Other long term (current) drug therapy: Secondary | ICD-10-CM | POA: Diagnosis not present

## 2021-01-11 DIAGNOSIS — M159 Polyosteoarthritis, unspecified: Secondary | ICD-10-CM | POA: Diagnosis not present

## 2021-01-11 DIAGNOSIS — J309 Allergic rhinitis, unspecified: Secondary | ICD-10-CM | POA: Diagnosis not present

## 2021-01-11 DIAGNOSIS — M810 Age-related osteoporosis without current pathological fracture: Secondary | ICD-10-CM | POA: Diagnosis not present

## 2021-01-11 DIAGNOSIS — F418 Other specified anxiety disorders: Secondary | ICD-10-CM | POA: Diagnosis not present

## 2021-01-11 DIAGNOSIS — I10 Essential (primary) hypertension: Secondary | ICD-10-CM | POA: Diagnosis not present

## 2021-01-15 ENCOUNTER — Ambulatory Visit: Payer: Medicare HMO | Admitting: Cardiology

## 2021-01-15 DIAGNOSIS — I639 Cerebral infarction, unspecified: Secondary | ICD-10-CM | POA: Diagnosis not present

## 2021-01-15 DIAGNOSIS — N3281 Overactive bladder: Secondary | ICD-10-CM | POA: Diagnosis not present

## 2021-01-25 ENCOUNTER — Telehealth: Payer: Self-pay

## 2021-01-25 ENCOUNTER — Ambulatory Visit (INDEPENDENT_AMBULATORY_CARE_PROVIDER_SITE_OTHER): Payer: Medicare HMO

## 2021-01-25 DIAGNOSIS — I4821 Permanent atrial fibrillation: Secondary | ICD-10-CM

## 2021-01-25 DIAGNOSIS — I442 Atrioventricular block, complete: Secondary | ICD-10-CM | POA: Diagnosis not present

## 2021-01-25 NOTE — Telephone Encounter (Signed)
The patient son (dpr) states the patient handheld shows low battery. I conference [employer] tech support for the patient son Sam.  [employer] is sending the patient a new handheld in 7-10 business days. I let him know that we can wait on the transmission. As long as she is not having any symptoms we can wait. The patient son agreed to send the transmission as soon as he get the handheld in the mail.

## 2021-01-30 ENCOUNTER — Telehealth: Payer: Self-pay

## 2021-01-30 LAB — CUP PACEART REMOTE DEVICE CHECK
Battery Remaining Longevity: 19 mo
Battery Voltage: 2.9 V
Brady Statistic AP VP Percent: 0 %
Brady Statistic AP VS Percent: 0 %
Brady Statistic AS VP Percent: 100 %
Brady Statistic AS VS Percent: 0 %
Brady Statistic RA Percent Paced: 0 %
Brady Statistic RV Percent Paced: 99.95 %
Date Time Interrogation Session: 20220405205933
Implantable Lead Implant Date: 20150916
Implantable Lead Implant Date: 20150916
Implantable Lead Implant Date: 20150916
Implantable Lead Location: 753859
Implantable Lead Location: 753860
Implantable Lead Location: 753860
Implantable Lead Model: 5076
Implantable Lead Model: 5076
Implantable Lead Model: 5076
Implantable Pulse Generator Implant Date: 20150916
Lead Channel Impedance Value: 228 Ohm
Lead Channel Impedance Value: 323 Ohm
Lead Channel Impedance Value: 323 Ohm
Lead Channel Impedance Value: 399 Ohm
Lead Channel Impedance Value: 418 Ohm
Lead Channel Impedance Value: 475 Ohm
Lead Channel Impedance Value: 494 Ohm
Lead Channel Impedance Value: 551 Ohm
Lead Channel Impedance Value: 570 Ohm
Lead Channel Pacing Threshold Amplitude: 0.625 V
Lead Channel Pacing Threshold Amplitude: 1.25 V
Lead Channel Pacing Threshold Amplitude: 1.25 V
Lead Channel Pacing Threshold Pulse Width: 0.4 ms
Lead Channel Pacing Threshold Pulse Width: 0.4 ms
Lead Channel Pacing Threshold Pulse Width: 0.4 ms
Lead Channel Sensing Intrinsic Amplitude: 3.75 mV
Lead Channel Sensing Intrinsic Amplitude: 3.75 mV
Lead Channel Sensing Intrinsic Amplitude: 6.625 mV
Lead Channel Sensing Intrinsic Amplitude: 6.625 mV
Lead Channel Setting Pacing Amplitude: 2 V
Lead Channel Setting Pacing Amplitude: 2.5 V
Lead Channel Setting Pacing Pulse Width: 0.4 ms
Lead Channel Setting Pacing Pulse Width: 0.4 ms
Lead Channel Setting Sensing Sensitivity: 4 mV

## 2021-01-30 NOTE — Telephone Encounter (Signed)
Carelink alert received 01/30/21 for elevated LV threshold. Abrupt increase 11/2020. RV threshold stable. Chronic AF. BVP@ 99.9%. Programmed VVIR secondary to chronic AF. Attempted to contact patient for [facility] appointment to check LV lead status. Hipaa compliant message left with son Juanda Crumble, not on [place], requesting patient call back [phone].

## 2021-01-31 NOTE — Telephone Encounter (Signed)
Called patient to advise we need her to come into the device clinic to check her lead. Spoke to son Juanda Crumble (patient gave permission to discuss care), apt. Made for 02/14/21 @ 3:20. States the patient has transportation issues and this is the best time that they can get patient here. States if for some reason her transportation is not able to bring her he will call to let us know.

## 2021-01-31 NOTE — Telephone Encounter (Signed)
Patient son returning phone call, let patient know he will get a call back as the nurse is with a patient

## 2021-02-05 DIAGNOSIS — I714 Abdominal aortic aneurysm, without rupture: Secondary | ICD-10-CM | POA: Diagnosis not present

## 2021-02-05 DIAGNOSIS — I10 Essential (primary) hypertension: Secondary | ICD-10-CM | POA: Diagnosis not present

## 2021-02-05 DIAGNOSIS — M545 Low back pain, unspecified: Secondary | ICD-10-CM | POA: Diagnosis not present

## 2021-02-05 DIAGNOSIS — G250 Essential tremor: Secondary | ICD-10-CM | POA: Diagnosis not present

## 2021-02-05 DIAGNOSIS — K219 Gastro-esophageal reflux disease without esophagitis: Secondary | ICD-10-CM | POA: Diagnosis not present

## 2021-02-05 DIAGNOSIS — G2581 Restless legs syndrome: Secondary | ICD-10-CM | POA: Diagnosis not present

## 2021-02-05 NOTE — Progress Notes (Signed)
Remote pacemaker transmission.   

## 2021-02-15 DIAGNOSIS — I639 Cerebral infarction, unspecified: Secondary | ICD-10-CM | POA: Diagnosis not present

## 2021-02-15 DIAGNOSIS — N3281 Overactive bladder: Secondary | ICD-10-CM | POA: Diagnosis not present

## 2021-03-11 DIAGNOSIS — J449 Chronic obstructive pulmonary disease, unspecified: Secondary | ICD-10-CM | POA: Diagnosis not present

## 2021-03-11 DIAGNOSIS — G2581 Restless legs syndrome: Secondary | ICD-10-CM | POA: Diagnosis not present

## 2021-03-11 DIAGNOSIS — M545 Low back pain, unspecified: Secondary | ICD-10-CM | POA: Diagnosis not present

## 2021-03-11 DIAGNOSIS — E559 Vitamin D deficiency, unspecified: Secondary | ICD-10-CM | POA: Diagnosis not present

## 2021-03-11 DIAGNOSIS — I714 Abdominal aortic aneurysm, without rupture: Secondary | ICD-10-CM | POA: Diagnosis not present

## 2021-03-11 DIAGNOSIS — I10 Essential (primary) hypertension: Secondary | ICD-10-CM | POA: Diagnosis not present

## 2021-03-11 DIAGNOSIS — K219 Gastro-esophageal reflux disease without esophagitis: Secondary | ICD-10-CM | POA: Diagnosis not present

## 2021-03-19 DIAGNOSIS — N3281 Overactive bladder: Secondary | ICD-10-CM | POA: Diagnosis not present

## 2021-03-19 DIAGNOSIS — I639 Cerebral infarction, unspecified: Secondary | ICD-10-CM | POA: Diagnosis not present

## 2021-04-08 ENCOUNTER — Other Ambulatory Visit: Payer: Self-pay | Admitting: Cardiology

## 2021-04-08 NOTE — Telephone Encounter (Signed)
51f, 54.4kg, Creatinine, Serum 1.120 mg/ 12/20/2020, lovw/krasowski 05/01/20

## 2021-04-10 DIAGNOSIS — M810 Age-related osteoporosis without current pathological fracture: Secondary | ICD-10-CM | POA: Diagnosis not present

## 2021-04-10 DIAGNOSIS — I10 Essential (primary) hypertension: Secondary | ICD-10-CM | POA: Diagnosis not present

## 2021-04-10 DIAGNOSIS — M159 Polyosteoarthritis, unspecified: Secondary | ICD-10-CM | POA: Diagnosis not present

## 2021-04-10 DIAGNOSIS — F418 Other specified anxiety disorders: Secondary | ICD-10-CM | POA: Diagnosis not present

## 2021-04-10 DIAGNOSIS — J309 Allergic rhinitis, unspecified: Secondary | ICD-10-CM | POA: Diagnosis not present

## 2021-04-18 DIAGNOSIS — I639 Cerebral infarction, unspecified: Secondary | ICD-10-CM | POA: Diagnosis not present

## 2021-04-18 DIAGNOSIS — N3281 Overactive bladder: Secondary | ICD-10-CM | POA: Diagnosis not present

## 2021-04-26 ENCOUNTER — Ambulatory Visit (INDEPENDENT_AMBULATORY_CARE_PROVIDER_SITE_OTHER): Payer: Medicare HMO

## 2021-04-26 DIAGNOSIS — I639 Cerebral infarction, unspecified: Secondary | ICD-10-CM | POA: Diagnosis not present

## 2021-04-26 DIAGNOSIS — Z950 Presence of cardiac pacemaker: Secondary | ICD-10-CM | POA: Diagnosis not present

## 2021-04-29 LAB — CUP PACEART REMOTE DEVICE CHECK
Battery Remaining Longevity: 13 mo
Battery Voltage: 2.88 V
Brady Statistic AP VP Percent: 0 %
Brady Statistic AP VS Percent: 0 %
Brady Statistic AS VP Percent: 100 %
Brady Statistic AS VS Percent: 0 %
Brady Statistic RA Percent Paced: 0 %
Brady Statistic RV Percent Paced: 99.95 %
Date Time Interrogation Session: 20220701185836
Implantable Lead Implant Date: 20150916
Implantable Lead Implant Date: 20150916
Implantable Lead Implant Date: 20150916
Implantable Lead Location: 753859
Implantable Lead Location: 753860
Implantable Lead Location: 753860
Implantable Lead Model: 5076
Implantable Lead Model: 5076
Implantable Lead Model: 5076
Implantable Pulse Generator Implant Date: 20150916
Lead Channel Impedance Value: 247 Ohm
Lead Channel Impedance Value: 323 Ohm
Lead Channel Impedance Value: 361 Ohm
Lead Channel Impedance Value: 418 Ohm
Lead Channel Impedance Value: 418 Ohm
Lead Channel Impedance Value: 513 Ohm
Lead Channel Impedance Value: 513 Ohm
Lead Channel Impedance Value: 570 Ohm
Lead Channel Impedance Value: 684 Ohm
Lead Channel Pacing Threshold Amplitude: 0.625 V
Lead Channel Pacing Threshold Amplitude: 1.25 V
Lead Channel Pacing Threshold Amplitude: 1.375 V
Lead Channel Pacing Threshold Pulse Width: 0.4 ms
Lead Channel Pacing Threshold Pulse Width: 0.4 ms
Lead Channel Pacing Threshold Pulse Width: 0.4 ms
Lead Channel Sensing Intrinsic Amplitude: 24.875 mV
Lead Channel Sensing Intrinsic Amplitude: 24.875 mV
Lead Channel Sensing Intrinsic Amplitude: 3.75 mV
Lead Channel Sensing Intrinsic Amplitude: 3.75 mV
Lead Channel Setting Pacing Amplitude: 2 V
Lead Channel Setting Pacing Amplitude: 2.75 V
Lead Channel Setting Pacing Pulse Width: 0.4 ms
Lead Channel Setting Pacing Pulse Width: 0.4 ms
Lead Channel Setting Sensing Sensitivity: 4 mV

## 2021-05-08 ENCOUNTER — Telehealth: Payer: Self-pay

## 2021-05-08 DIAGNOSIS — G250 Essential tremor: Secondary | ICD-10-CM | POA: Diagnosis not present

## 2021-05-08 DIAGNOSIS — G2581 Restless legs syndrome: Secondary | ICD-10-CM | POA: Diagnosis not present

## 2021-05-08 DIAGNOSIS — K219 Gastro-esophageal reflux disease without esophagitis: Secondary | ICD-10-CM | POA: Diagnosis not present

## 2021-05-08 DIAGNOSIS — M545 Low back pain, unspecified: Secondary | ICD-10-CM | POA: Diagnosis not present

## 2021-05-08 DIAGNOSIS — J449 Chronic obstructive pulmonary disease, unspecified: Secondary | ICD-10-CM | POA: Diagnosis not present

## 2021-05-08 DIAGNOSIS — I10 Essential (primary) hypertension: Secondary | ICD-10-CM | POA: Diagnosis not present

## 2021-05-08 DIAGNOSIS — I714 Abdominal aortic aneurysm, without rupture: Secondary | ICD-10-CM | POA: Diagnosis not present

## 2021-05-08 NOTE — Telephone Encounter (Signed)
-----   Message from [name], MD sent at 05/07/2021  4:13 PM EDT ----- Normal remote reviewed. Battery and lead parameters stable. LV threshold elevated. Needs to come to device clinic to evaluate.

## 2021-05-08 NOTE — Telephone Encounter (Signed)
I spoke with the patient son and He states they  will bring the patient in tomorrow at 2:40 pm.

## 2021-05-08 NOTE — Telephone Encounter (Signed)
Successful telephone encounter to Nichole Nelson who gives permission to speak to son Juanda Crumble. Informed son that patient needs appointment ASAP to evaluate LV lead as threshold remains high. Of note patient was scheduled 02/14/21 to evaluate this ongoing problem. Provided several upcoming device clinic days and appointment times and reinforced importance of appointment. Son states he will call back today with appointment choice.

## 2021-05-09 ENCOUNTER — Other Ambulatory Visit: Payer: Self-pay

## 2021-05-09 ENCOUNTER — Ambulatory Visit (INDEPENDENT_AMBULATORY_CARE_PROVIDER_SITE_OTHER): Payer: Medicare HMO

## 2021-05-09 DIAGNOSIS — I4821 Permanent atrial fibrillation: Secondary | ICD-10-CM | POA: Diagnosis not present

## 2021-05-09 DIAGNOSIS — Z9889 Other specified postprocedural states: Secondary | ICD-10-CM

## 2021-05-09 LAB — CUP PACEART INCLINIC DEVICE CHECK
Battery Remaining Longevity: 13 mo
Battery Voltage: 2.88 V
Brady Statistic AP VP Percent: 0 %
Brady Statistic AP VS Percent: 0 %
Brady Statistic AS VP Percent: 99.92 %
Brady Statistic AS VS Percent: 0.08 %
Brady Statistic RA Percent Paced: 0 %
Brady Statistic RV Percent Paced: 99.94 %
Date Time Interrogation Session: 20220714150913
Implantable Lead Implant Date: 20150916
Implantable Lead Implant Date: 20150916
Implantable Lead Implant Date: 20150916
Implantable Lead Location: 753859
Implantable Lead Location: 753860
Implantable Lead Location: 753860
Implantable Lead Model: 5076
Implantable Lead Model: 5076
Implantable Lead Model: 5076
Implantable Pulse Generator Implant Date: 20150916
Lead Channel Impedance Value: 266 Ohm
Lead Channel Impedance Value: 323 Ohm
Lead Channel Impedance Value: 380 Ohm
Lead Channel Impedance Value: 437 Ohm
Lead Channel Impedance Value: 456 Ohm
Lead Channel Impedance Value: 570 Ohm
Lead Channel Impedance Value: 665 Ohm
Lead Channel Impedance Value: 741 Ohm
Lead Channel Impedance Value: 798 Ohm
Lead Channel Pacing Threshold Amplitude: 0.5 V
Lead Channel Pacing Threshold Amplitude: 0.625 V
Lead Channel Pacing Threshold Amplitude: 1.25 V
Lead Channel Pacing Threshold Pulse Width: 0.4 ms
Lead Channel Pacing Threshold Pulse Width: 0.4 ms
Lead Channel Pacing Threshold Pulse Width: 0.4 ms
Lead Channel Sensing Intrinsic Amplitude: 3.75 mV
Lead Channel Sensing Intrinsic Amplitude: 5.375 mV
Lead Channel Setting Pacing Amplitude: 2 V
Lead Channel Setting Pacing Amplitude: 2.5 V
Lead Channel Setting Pacing Pulse Width: 0.4 ms
Lead Channel Setting Pacing Pulse Width: 0.4 ms
Lead Channel Setting Sensing Sensitivity: 4 mV

## 2021-05-09 NOTE — Progress Notes (Signed)
Pacemaker check in clinic with industry due to reported elevated LV threshold. Normal device function. RV Thresholds, R wave sensing, impedances consistent with previous measurements. LV threshold test with result of 0.75v@0 .52ms, LV lead noted in device to be on RV septum, programmed VVIR with LV first offset by 13ms.  This is making LV capture management inaccurate.  Patient asymptomatic.  Will need 12 lead ECG assessment at MD visit.  Device programmed to maximize longevity. AHR and VHR episodes all greater than 2 years ago.  Device programmed at appropriate safety margins. Histogram distribution appropriate for patient activity level. Estimated longevity 40months. Patient enrolled in remote follow-up, next scheduled check 07/26/21. Patient education completed.

## 2021-05-16 NOTE — Progress Notes (Signed)
Remote pacemaker transmission.   

## 2021-05-20 DIAGNOSIS — N3281 Overactive bladder: Secondary | ICD-10-CM | POA: Diagnosis not present

## 2021-05-20 DIAGNOSIS — I639 Cerebral infarction, unspecified: Secondary | ICD-10-CM | POA: Diagnosis not present

## 2021-06-04 DIAGNOSIS — Z9181 History of falling: Secondary | ICD-10-CM | POA: Diagnosis not present

## 2021-06-04 DIAGNOSIS — E785 Hyperlipidemia, unspecified: Secondary | ICD-10-CM | POA: Diagnosis not present

## 2021-06-04 DIAGNOSIS — Z1331 Encounter for screening for depression: Secondary | ICD-10-CM | POA: Diagnosis not present

## 2021-06-04 DIAGNOSIS — Z0000 Encounter for general adult medical examination without abnormal findings: Secondary | ICD-10-CM | POA: Diagnosis not present

## 2021-06-10 DIAGNOSIS — F418 Other specified anxiety disorders: Secondary | ICD-10-CM | POA: Diagnosis not present

## 2021-06-10 DIAGNOSIS — K219 Gastro-esophageal reflux disease without esophagitis: Secondary | ICD-10-CM | POA: Diagnosis not present

## 2021-06-10 DIAGNOSIS — G2581 Restless legs syndrome: Secondary | ICD-10-CM | POA: Diagnosis not present

## 2021-06-10 DIAGNOSIS — I714 Abdominal aortic aneurysm, without rupture: Secondary | ICD-10-CM | POA: Diagnosis not present

## 2021-06-10 DIAGNOSIS — E559 Vitamin D deficiency, unspecified: Secondary | ICD-10-CM | POA: Diagnosis not present

## 2021-06-10 DIAGNOSIS — M545 Low back pain, unspecified: Secondary | ICD-10-CM | POA: Diagnosis not present

## 2021-06-10 DIAGNOSIS — I10 Essential (primary) hypertension: Secondary | ICD-10-CM | POA: Diagnosis not present

## 2021-06-12 DIAGNOSIS — N390 Urinary tract infection, site not specified: Secondary | ICD-10-CM | POA: Diagnosis not present

## 2021-06-19 DIAGNOSIS — I639 Cerebral infarction, unspecified: Secondary | ICD-10-CM | POA: Diagnosis not present

## 2021-06-19 DIAGNOSIS — N3281 Overactive bladder: Secondary | ICD-10-CM | POA: Diagnosis not present

## 2021-07-08 ENCOUNTER — Encounter: Payer: Medicare HMO | Admitting: Cardiology

## 2021-07-10 DIAGNOSIS — J309 Allergic rhinitis, unspecified: Secondary | ICD-10-CM | POA: Diagnosis not present

## 2021-07-10 DIAGNOSIS — F418 Other specified anxiety disorders: Secondary | ICD-10-CM | POA: Diagnosis not present

## 2021-07-10 DIAGNOSIS — I10 Essential (primary) hypertension: Secondary | ICD-10-CM | POA: Diagnosis not present

## 2021-07-10 DIAGNOSIS — M810 Age-related osteoporosis without current pathological fracture: Secondary | ICD-10-CM | POA: Diagnosis not present

## 2021-07-10 DIAGNOSIS — M159 Polyosteoarthritis, unspecified: Secondary | ICD-10-CM | POA: Diagnosis not present

## 2021-07-23 DIAGNOSIS — N3281 Overactive bladder: Secondary | ICD-10-CM | POA: Diagnosis not present

## 2021-07-23 DIAGNOSIS — I639 Cerebral infarction, unspecified: Secondary | ICD-10-CM | POA: Diagnosis not present

## 2021-07-26 ENCOUNTER — Ambulatory Visit (INDEPENDENT_AMBULATORY_CARE_PROVIDER_SITE_OTHER): Payer: Medicare HMO

## 2021-07-26 DIAGNOSIS — I442 Atrioventricular block, complete: Secondary | ICD-10-CM

## 2021-07-26 LAB — CUP PACEART REMOTE DEVICE CHECK
Battery Remaining Longevity: 10 mo
Battery Voltage: 2.87 V
Brady Statistic AP VP Percent: 0 %
Brady Statistic AP VS Percent: 0 %
Brady Statistic AS VP Percent: 100 %
Brady Statistic AS VS Percent: 0 %
Brady Statistic RA Percent Paced: 0 %
Brady Statistic RV Percent Paced: 99.96 %
Date Time Interrogation Session: 20220928165732
Implantable Lead Implant Date: 20150916
Implantable Lead Implant Date: 20150916
Implantable Lead Implant Date: 20150916
Implantable Lead Location: 753859
Implantable Lead Location: 753860
Implantable Lead Location: 753860
Implantable Lead Model: 5076
Implantable Lead Model: 5076
Implantable Lead Model: 5076
Implantable Pulse Generator Implant Date: 20150916
Lead Channel Impedance Value: 228 Ohm
Lead Channel Impedance Value: 323 Ohm
Lead Channel Impedance Value: 323 Ohm
Lead Channel Impedance Value: 437 Ohm
Lead Channel Impedance Value: 437 Ohm
Lead Channel Impedance Value: 475 Ohm
Lead Channel Impedance Value: 532 Ohm
Lead Channel Impedance Value: 532 Ohm
Lead Channel Impedance Value: 551 Ohm
Lead Channel Pacing Threshold Amplitude: 0.625 V
Lead Channel Pacing Threshold Amplitude: 1.25 V
Lead Channel Pacing Threshold Amplitude: 4 V
Lead Channel Pacing Threshold Pulse Width: 0.4 ms
Lead Channel Pacing Threshold Pulse Width: 0.4 ms
Lead Channel Pacing Threshold Pulse Width: 0.4 ms
Lead Channel Sensing Intrinsic Amplitude: 24.875 mV
Lead Channel Sensing Intrinsic Amplitude: 3.75 mV
Lead Channel Sensing Intrinsic Amplitude: 3.75 mV
Lead Channel Sensing Intrinsic Amplitude: 5.375 mV
Lead Channel Setting Pacing Amplitude: 2 V
Lead Channel Setting Pacing Amplitude: 2.5 V
Lead Channel Setting Pacing Pulse Width: 0.4 ms
Lead Channel Setting Pacing Pulse Width: 0.4 ms
Lead Channel Setting Sensing Sensitivity: 4 mV

## 2021-07-31 NOTE — Progress Notes (Signed)
Remote pacemaker transmission.   

## 2021-08-08 DIAGNOSIS — G2581 Restless legs syndrome: Secondary | ICD-10-CM | POA: Diagnosis not present

## 2021-08-08 DIAGNOSIS — M545 Low back pain, unspecified: Secondary | ICD-10-CM | POA: Diagnosis not present

## 2021-08-08 DIAGNOSIS — G250 Essential tremor: Secondary | ICD-10-CM | POA: Diagnosis not present

## 2021-08-08 DIAGNOSIS — K219 Gastro-esophageal reflux disease without esophagitis: Secondary | ICD-10-CM | POA: Diagnosis not present

## 2021-08-08 DIAGNOSIS — I10 Essential (primary) hypertension: Secondary | ICD-10-CM | POA: Diagnosis not present

## 2021-08-23 DIAGNOSIS — I639 Cerebral infarction, unspecified: Secondary | ICD-10-CM | POA: Diagnosis not present

## 2021-08-23 DIAGNOSIS — N3281 Overactive bladder: Secondary | ICD-10-CM | POA: Diagnosis not present

## 2021-09-09 DIAGNOSIS — Z6823 Body mass index (BMI) 23.0-23.9, adult: Secondary | ICD-10-CM | POA: Diagnosis not present

## 2021-09-09 DIAGNOSIS — E559 Vitamin D deficiency, unspecified: Secondary | ICD-10-CM | POA: Diagnosis not present

## 2021-09-09 DIAGNOSIS — F418 Other specified anxiety disorders: Secondary | ICD-10-CM | POA: Diagnosis not present

## 2021-09-09 DIAGNOSIS — I10 Essential (primary) hypertension: Secondary | ICD-10-CM | POA: Diagnosis not present

## 2021-09-09 DIAGNOSIS — K219 Gastro-esophageal reflux disease without esophagitis: Secondary | ICD-10-CM | POA: Diagnosis not present

## 2021-09-09 DIAGNOSIS — M545 Low back pain, unspecified: Secondary | ICD-10-CM | POA: Diagnosis not present

## 2021-09-09 DIAGNOSIS — G2581 Restless legs syndrome: Secondary | ICD-10-CM | POA: Diagnosis not present

## 2021-10-02 ENCOUNTER — Encounter: Payer: Medicare HMO | Admitting: Cardiology

## 2021-10-07 ENCOUNTER — Telehealth: Payer: Self-pay | Admitting: Cardiology

## 2021-10-07 ENCOUNTER — Encounter: Payer: Medicare HMO | Admitting: Cardiology

## 2021-10-07 DIAGNOSIS — M159 Polyosteoarthritis, unspecified: Secondary | ICD-10-CM | POA: Diagnosis not present

## 2021-10-07 DIAGNOSIS — I10 Essential (primary) hypertension: Secondary | ICD-10-CM | POA: Diagnosis not present

## 2021-10-07 DIAGNOSIS — M810 Age-related osteoporosis without current pathological fracture: Secondary | ICD-10-CM | POA: Diagnosis not present

## 2021-10-07 DIAGNOSIS — F418 Other specified anxiety disorders: Secondary | ICD-10-CM | POA: Diagnosis not present

## 2021-10-07 DIAGNOSIS — J309 Allergic rhinitis, unspecified: Secondary | ICD-10-CM | POA: Diagnosis not present

## 2021-10-07 NOTE — Progress Notes (Deleted)
Cardiology Office Note Date:  10/07/2021  Patient ID:  Nichole, Nelson [birth year], MRN [id number] PCP:  Nicholos Johns, MD  Cardiologist:  Dr. Agustin Cree Electrophysiologist: Dr. Curt Bears  ***refresh   Chief Complaint: *** over due visit  History of Present Illness: Nichole Nelson is a 86 y.o. female with history of permanent AFib w/AVNode ablation/PPM, HTN, HLD, hypothyroidism, stroke, PVD (mild carotid disease), CAD (remote CABG), ?*** AVR  She comes in today to be seen for Dr. Curt Bears, last seen by him via tele health April 2020, she was doing well, no difficulties with her ADLs, no changes were made.  More recently she saw Dr. Agustin Cree in July 2021, he describes her as very sedentary, not doing much at all, planned to update her carotid US, no changes were made to her medicines.  *** last echo 2018, AV OK *** + remotes *** symptoms *** bleeding, labs, dose, 2.5 for age/weight *** meds, CAD *** near [facility]   Device information MDT CRT-P, implanted 07/12/2014 Hx of AV node ablation   Past Medical History:  Diagnosis Date   Abdominal fibromatosis    Anemia    Anxiety    ARF (acute renal failure) ([place]) 04/20/2017   Atrial fibrillation ([place]) 03/09/2014   Carotid stenosis 03/09/2014   Overview:  L ICA 60-79% stenosis on 03/03/14   Cerebral infarction ([place]) 03/09/2014   Overview:  R Hemisphere   Chronic respiratory failure ([facility])    COPD (chronic obstructive pulmonary disease) ([place])    Coronary artery disease involving native coronary artery 04/20/2018   CVA (cerebral vascular accident) ([place])    Diarrhea 3/81/0175   Embolic stroke involving right middle cerebral artery ([place]) 09/04/2015   Essential tremor 03/01/2015   H/O acute pancreatitis    History of myocardial infarction    HTN (hypertension) 04/23/2015   Hx of CABG 04/20/2018   Hyperlipidemia    Hypertension    Hyponatremia 04/09/2017   Hypothyroidism 04/09/2017   Iron deficiency anemia, unspecified 06/16/2019    Pacemaker 04/23/2015   S/P AV nodal ablation 04/23/2015   Overview:  Done by Dr. Elonda Husky   Thyroid disease     Past Surgical History:  Procedure Laterality Date   ABDOMINAL HYSTERECTOMY     AORTIC VALVE REPAIR     APPENDECTOMY     CORONARY ARTERY BYPASS GRAFT     ESOPHAGOGASTRODUODENOSCOPY  01/26/2014   Moderate hiatal hernia. presbyesophagus. No evidence of upper GI bleeding.    ESOPHAGOGASTRODUODENOSCOPY  01/26/2014   Moderate hiatal hernia. Presbyesophagus. No evidene of upper Gi bleeding.    HERNIA REPAIR     INSERT / REPLACE / REMOVE PACEMAKER     Medtronic    Current Outpatient Medications  Medication Sig Dispense Refill   apixaban (ELIQUIS) 2.5 MG TABS tablet TAKE 1 TABLET BY MOUTH TWICE DAILY 180 tablet 1   CARTIA XT 120 MG 24 hr capsule Take 1 tablet by mouth daily. (Patient not taking: Reported on 05/01/2020)     Cholecalciferol (VITAMIN D3) 2000 units capsule Take 1 capsule by mouth daily.     clonazePAM (KLONOPIN) 0.5 MG tablet Take 0.5 mg by mouth 2 (two) times daily.     dexlansoprazole (DEXILANT) 60 MG capsule Take 60 mg by mouth daily.     diltiazem (TIAZAC) 120 MG 24 hr capsule Take 1 capsule by mouth daily. (Patient not taking: Reported on 05/01/2020)     escitalopram (LEXAPRO) 10 MG tablet Take 10 mg by mouth daily.     ferrous  sulfate 325 (65 FE) MG tablet Take 325 mg by mouth 2 (two) times daily with a meal.  (Patient not taking: Reported on 05/01/2020)     fluticasone furoate-vilanterol (BREO ELLIPTA) 100-25 MCG/INH AEPB Inhale 1 puff into the lungs daily.     gabapentin (NEURONTIN) 100 MG capsule Take 100 mg by mouth 4 (four) times daily.     levothyroxine (SYNTHROID) 100 MCG tablet Take 100 mcg by mouth daily.     levothyroxine (SYNTHROID, LEVOTHROID) 88 MCG tablet Take 1 tablet by mouth daily.     losartan (COZAAR) 50 MG tablet Take 1 tablet by mouth daily.     mometasone (NASONEX) 50 MCG/ACT nasal spray Place 1 spray into both nostrils daily.     montelukast  (SINGULAIR) 10 MG tablet Take 10 mg by mouth daily.     Omega-3 Fatty Acids (FISH OIL) 1000 MG CAPS Take 2,000 mg by mouth 2 (two) times daily.     ondansetron (ZOFRAN) 4 MG tablet Take 4 mg by mouth every 8 (eight) hours as needed for nausea/vomiting.     pravastatin (PRAVACHOL) 80 MG tablet Take 80 mg by mouth daily.     rOPINIRole (REQUIP) 0.5 MG tablet Take 1 tablet by mouth daily. (Patient not taking: Reported on 05/01/2020)     traMADol (ULTRAM) 50 MG tablet Take 100 mg by mouth every 8 (eight) hours as needed for pain.     No current facility-administered medications for this visit.    Allergies:   Iodinated diagnostic agents; Antihistamines, chlorpheniramine-type; and Meperidine   Social History:  The patient  reports that she has never smoked. She has never used smokeless tobacco. She reports that she does not drink alcohol and does not use drugs.   Family History:  The patient's family history includes Aneurysm in her father; Kidney failure in her mother; Stroke in her mother.  ROS:  Please see the history of present illness.    All other systems are reviewed and otherwise negative.   PHYSICAL EXAM:  VS:  There were no vitals taken for this visit. BMI: There is no height or weight on file to calculate BMI. Well nourished, well developed, in no acute distress HEENT: normocephalic, atraumatic Neck: no JVD, carotid bruits or masses Cardiac:  *** RRR; no significant murmurs, no rubs, or gallops Lungs:  *** CTA b/l, no wheezing, rhonchi or rales Abd: soft, nontender MS: no deformity or *** atrophy Ext: *** no edema Skin: warm and dry, no rash Neuro:  No gross deficits appreciated Psych: euthymic mood, full affect  *** PPM site is stable, no tethering or discomfort   EKG:  Done today and reviewed by myself shows  ***  Device interrogation done today and reviewed by myself:  ***  02/16/2017: TTE LVEF 52% Mild AI, MR    Recent Labs: No results found for requested labs  within last 8760 hours.  No results found for requested labs within last 8760 hours.   CrCl cannot be calculated (Patient's most recent lab result is older than the maximum 21 days allowed.).   Wt Readings from Last 3 Encounters:  05/01/20 120 lb (54.4 kg)  10/04/19 120 lb (54.4 kg)  02/07/19 115 lb (52.2 kg)     Other studies reviewed: Additional studies/records reviewed today include: summarized above  ASSESSMENT AND PLAN:  PPM ***  Permanent AFib CHA2DS2Vasc is 7, on Eliquis, *** appropriately dosed  CAD ***  HTN ***  Disposition: F/u with ***  Current medicines are reviewed  at length with the patient today.  The patient did not have any concerns regarding medicines.  Venetia Night, PA-C 10/07/2021 11:19 AM     [facility] [address] [place] [address] [phone] (office)  [phone] (fax)

## 2021-10-07 NOTE — Telephone Encounter (Signed)
Eliquis 2.5 mg refill request received. Patient is 86 years old, weight- 54.41 kg, Crea- 1.14 on 05/01/20, Diagnosis-aib, and last seen by Dr. Agustin Cree on 05/01/20.  Pt is overdue for labs and appt w/ provider and for labs.

## 2021-10-08 ENCOUNTER — Other Ambulatory Visit: Payer: Self-pay

## 2021-10-08 MED ORDER — APIXABAN 2.5 MG PO TABS: 2.5000 mg | ORAL_TABLET | Freq: Two times a day (BID) | ORAL | 0 refills | Status: DC

## 2021-10-09 ENCOUNTER — Encounter: Payer: Medicare HMO | Admitting: Physician Assistant

## 2021-10-22 DIAGNOSIS — N3281 Overactive bladder: Secondary | ICD-10-CM | POA: Diagnosis not present

## 2021-10-22 DIAGNOSIS — I639 Cerebral infarction, unspecified: Secondary | ICD-10-CM | POA: Diagnosis not present

## 2021-10-25 ENCOUNTER — Ambulatory Visit (INDEPENDENT_AMBULATORY_CARE_PROVIDER_SITE_OTHER): Payer: Medicare HMO

## 2021-10-25 DIAGNOSIS — I442 Atrioventricular block, complete: Secondary | ICD-10-CM

## 2021-10-27 LAB — CUP PACEART REMOTE DEVICE CHECK
Battery Remaining Longevity: 8 mo
Battery Voltage: 2.86 V
Brady Statistic AP VP Percent: 0 %
Brady Statistic AP VS Percent: 0 %
Brady Statistic AS VP Percent: 100 %
Brady Statistic AS VS Percent: 0 %
Brady Statistic RA Percent Paced: 0 %
Brady Statistic RV Percent Paced: 99.94 %
Date Time Interrogation Session: 20221231003146
Implantable Lead Implant Date: 20150916
Implantable Lead Implant Date: 20150916
Implantable Lead Implant Date: 20150916
Implantable Lead Location: 753859
Implantable Lead Location: 753860
Implantable Lead Location: 753860
Implantable Lead Model: 5076
Implantable Lead Model: 5076
Implantable Lead Model: 5076
Implantable Pulse Generator Implant Date: 20150916
Lead Channel Impedance Value: 247 Ohm
Lead Channel Impedance Value: 323 Ohm
Lead Channel Impedance Value: 342 Ohm
Lead Channel Impedance Value: 437 Ohm
Lead Channel Impedance Value: 456 Ohm
Lead Channel Impedance Value: 513 Ohm
Lead Channel Impedance Value: 551 Ohm
Lead Channel Impedance Value: 570 Ohm
Lead Channel Impedance Value: 608 Ohm
Lead Channel Pacing Threshold Amplitude: 0.625 V
Lead Channel Pacing Threshold Amplitude: 1.25 V
Lead Channel Pacing Threshold Amplitude: 4 V
Lead Channel Pacing Threshold Pulse Width: 0.4 ms
Lead Channel Pacing Threshold Pulse Width: 0.4 ms
Lead Channel Pacing Threshold Pulse Width: 0.4 ms
Lead Channel Sensing Intrinsic Amplitude: 24.875 mV
Lead Channel Sensing Intrinsic Amplitude: 3.75 mV
Lead Channel Sensing Intrinsic Amplitude: 3.75 mV
Lead Channel Sensing Intrinsic Amplitude: 5.375 mV
Lead Channel Setting Pacing Amplitude: 2 V
Lead Channel Setting Pacing Amplitude: 2.5 V
Lead Channel Setting Pacing Pulse Width: 0.4 ms
Lead Channel Setting Pacing Pulse Width: 0.4 ms
Lead Channel Setting Sensing Sensitivity: 4 mV

## 2021-10-29 ENCOUNTER — Ambulatory Visit: Payer: Medicare HMO | Admitting: Cardiology

## 2021-10-30 ENCOUNTER — Encounter: Payer: Self-pay | Admitting: Cardiology

## 2021-11-06 NOTE — Progress Notes (Signed)
Remote pacemaker transmission.   

## 2021-11-07 ENCOUNTER — Other Ambulatory Visit: Payer: Self-pay | Admitting: Cardiology

## 2021-11-07 NOTE — Telephone Encounter (Signed)
Prescription refill request for Eliquis received. Indication: Afib  Last office visit: 05/01/20 Agustin Cree) - Pt has appt with Dr Agustin Cree on 01/08/22  Scr: 1.12 (12/20/20)  Age: 87 Weight: 54.4kg  Appropriate dose and refill sent to requested pharmacy.

## 2021-11-07 NOTE — Telephone Encounter (Deleted)
Prescription refill request for Eliquis received. Indication: Afib  Last office visit: 05/01/20 Agustin Cree)  Scr: 1.12 (12/20/20)  Age: 87 Weight: 54.4kg  Pt overdue for office visit.

## 2021-11-11 DIAGNOSIS — M545 Low back pain, unspecified: Secondary | ICD-10-CM | POA: Diagnosis not present

## 2021-11-11 DIAGNOSIS — K219 Gastro-esophageal reflux disease without esophagitis: Secondary | ICD-10-CM | POA: Diagnosis not present

## 2021-11-11 DIAGNOSIS — G250 Essential tremor: Secondary | ICD-10-CM | POA: Diagnosis not present

## 2021-11-11 DIAGNOSIS — I10 Essential (primary) hypertension: Secondary | ICD-10-CM | POA: Diagnosis not present

## 2021-11-11 DIAGNOSIS — G2581 Restless legs syndrome: Secondary | ICD-10-CM | POA: Diagnosis not present

## 2021-11-19 DIAGNOSIS — N3281 Overactive bladder: Secondary | ICD-10-CM | POA: Diagnosis not present

## 2021-11-19 DIAGNOSIS — I639 Cerebral infarction, unspecified: Secondary | ICD-10-CM | POA: Diagnosis not present

## 2021-12-09 DIAGNOSIS — G2581 Restless legs syndrome: Secondary | ICD-10-CM | POA: Diagnosis not present

## 2021-12-09 DIAGNOSIS — F418 Other specified anxiety disorders: Secondary | ICD-10-CM | POA: Diagnosis not present

## 2021-12-09 DIAGNOSIS — Z6823 Body mass index (BMI) 23.0-23.9, adult: Secondary | ICD-10-CM | POA: Diagnosis not present

## 2021-12-09 DIAGNOSIS — M545 Low back pain, unspecified: Secondary | ICD-10-CM | POA: Diagnosis not present

## 2021-12-09 DIAGNOSIS — G250 Essential tremor: Secondary | ICD-10-CM | POA: Diagnosis not present

## 2021-12-09 DIAGNOSIS — E559 Vitamin D deficiency, unspecified: Secondary | ICD-10-CM | POA: Diagnosis not present

## 2021-12-09 DIAGNOSIS — I10 Essential (primary) hypertension: Secondary | ICD-10-CM | POA: Diagnosis not present

## 2021-12-09 DIAGNOSIS — K219 Gastro-esophageal reflux disease without esophagitis: Secondary | ICD-10-CM | POA: Diagnosis not present

## 2021-12-19 DIAGNOSIS — I639 Cerebral infarction, unspecified: Secondary | ICD-10-CM | POA: Diagnosis not present

## 2021-12-19 DIAGNOSIS — J449 Chronic obstructive pulmonary disease, unspecified: Secondary | ICD-10-CM | POA: Diagnosis not present

## 2021-12-19 DIAGNOSIS — Z950 Presence of cardiac pacemaker: Secondary | ICD-10-CM | POA: Diagnosis not present

## 2021-12-19 DIAGNOSIS — R6889 Other general symptoms and signs: Secondary | ICD-10-CM | POA: Diagnosis not present

## 2021-12-19 DIAGNOSIS — R0602 Shortness of breath: Secondary | ICD-10-CM | POA: Diagnosis not present

## 2021-12-19 DIAGNOSIS — N3281 Overactive bladder: Secondary | ICD-10-CM | POA: Diagnosis not present

## 2022-01-06 ENCOUNTER — Other Ambulatory Visit: Payer: Self-pay | Admitting: Cardiology

## 2022-01-06 DIAGNOSIS — F418 Other specified anxiety disorders: Secondary | ICD-10-CM | POA: Diagnosis not present

## 2022-01-06 DIAGNOSIS — Z6823 Body mass index (BMI) 23.0-23.9, adult: Secondary | ICD-10-CM | POA: Diagnosis not present

## 2022-01-06 DIAGNOSIS — J309 Allergic rhinitis, unspecified: Secondary | ICD-10-CM | POA: Diagnosis not present

## 2022-01-06 DIAGNOSIS — E039 Hypothyroidism, unspecified: Secondary | ICD-10-CM | POA: Diagnosis not present

## 2022-01-06 DIAGNOSIS — M159 Polyosteoarthritis, unspecified: Secondary | ICD-10-CM | POA: Diagnosis not present

## 2022-01-06 DIAGNOSIS — M810 Age-related osteoporosis without current pathological fracture: Secondary | ICD-10-CM | POA: Diagnosis not present

## 2022-01-06 NOTE — Telephone Encounter (Signed)
Prescription refill request for Eliquis received. ?Indication:Afib ?Last office visit:upcoming ?PNT:[facility] ?Age: 87 ?Weight:54.4 kg ? ?Prescription refilled ? ?

## 2022-01-08 ENCOUNTER — Other Ambulatory Visit: Payer: Self-pay

## 2022-01-08 ENCOUNTER — Ambulatory Visit (INDEPENDENT_AMBULATORY_CARE_PROVIDER_SITE_OTHER): Payer: Medicare HMO | Admitting: Cardiology

## 2022-01-08 ENCOUNTER — Encounter: Payer: Self-pay | Admitting: Cardiology

## 2022-01-08 VITALS — BP 118/60 | HR 69 | Ht 62.0 in | Wt 125.0 lb

## 2022-01-08 DIAGNOSIS — I4821 Permanent atrial fibrillation: Secondary | ICD-10-CM | POA: Diagnosis not present

## 2022-01-08 DIAGNOSIS — Z950 Presence of cardiac pacemaker: Secondary | ICD-10-CM | POA: Diagnosis not present

## 2022-01-08 DIAGNOSIS — Z951 Presence of aortocoronary bypass graft: Secondary | ICD-10-CM

## 2022-01-08 DIAGNOSIS — R6889 Other general symptoms and signs: Secondary | ICD-10-CM | POA: Diagnosis not present

## 2022-01-08 DIAGNOSIS — E782 Mixed hyperlipidemia: Secondary | ICD-10-CM | POA: Diagnosis not present

## 2022-01-08 DIAGNOSIS — Z9889 Other specified postprocedural states: Secondary | ICD-10-CM

## 2022-01-08 DIAGNOSIS — I6523 Occlusion and stenosis of bilateral carotid arteries: Secondary | ICD-10-CM

## 2022-01-08 NOTE — Patient Instructions (Signed)

## 2022-01-08 NOTE — Progress Notes (Signed)
?Cardiology Office Note:   ? ?Date:  01/08/2022  ? ?ID:  Nichole Nelson, DOB November 29, 1934, MRN [id number] ? ?PCP:  Nicholos Johns, MD  ?Cardiologist:  Jenne Campus, MD   ? ?Referring MD: Nicholos Johns, MD  ? ?No chief complaint on file. ?Doing fine ? ?History of Present Illness:   ? ?Nichole Nelson is a 87 y.o. female with past medical history significant for coronary artery disease, status post coronary bypass graft done at [facility] by Dr. Jerelene Redden 12-1/2 years ago, status post dual-chamber pacemaker implantation, Medtronic device, permanent atrial fibrillation, status post AV nodal ablation, essential hypertension, carotid arterial disease. ?She comes to my office for follow-up.  Overall she seems to be doing well but the biggest problem is pain in the back and pain in the neck.  She denies having any chest pain tightness squeezing pressure burning chest.  She admits that she lives a very sedentary lifestyle.  She basically sits in the chair watch TV. ? ?Past Medical History:  ?Diagnosis Date  ? Abdominal fibromatosis   ? Anemia   ? Anxiety   ? ARF (acute renal failure) ([place]) 04/20/2017  ? Atrial fibrillation ([place]) 03/09/2014  ? Carotid stenosis 03/09/2014  ? Overview:  L ICA 60-79% stenosis on 03/03/14  ? Cerebral infarction ([place]) 03/09/2014  ? Overview:  R Hemisphere  ? Chronic respiratory failure ([place])   ? COPD (chronic obstructive pulmonary disease) ([place])   ? Coronary artery disease involving native coronary artery 04/20/2018  ? CVA (cerebral vascular accident) [facility])   ? Diarrhea 04/20/2018  ? Embolic stroke involving right middle cerebral artery ([place]) 09/04/2015  ? Essential tremor 03/01/2015  ? H/O acute pancreatitis   ? History of myocardial infarction   ? HTN (hypertension) 04/23/2015  ? Hx of CABG 04/20/2018  ? Hyperlipidemia   ? Hypertension   ? Hyponatremia 04/09/2017  ? Hypothyroidism 04/09/2017  ? Iron deficiency anemia, unspecified 06/16/2019  ? Pacemaker 04/23/2015  ? S/P AV nodal ablation  04/23/2015  ? Overview:  Done by Dr. Elonda Husky  ? Thyroid disease   ? ? ?Past Surgical History:  ?Procedure Laterality Date  ? ABDOMINAL HYSTERECTOMY    ? AORTIC VALVE REPAIR    ? APPENDECTOMY    ? CORONARY ARTERY BYPASS GRAFT    ? ESOPHAGOGASTRODUODENOSCOPY  01/26/2014  ? Moderate hiatal hernia. presbyesophagus. No evidence of upper GI bleeding.   ? ESOPHAGOGASTRODUODENOSCOPY  01/26/2014  ? Moderate hiatal hernia. Presbyesophagus. No evidene of upper Gi bleeding.   ? HERNIA REPAIR    ? INSERT / REPLACE / REMOVE PACEMAKER    ? Medtronic  ? ? ?Current Medications: ?No outpatient medications have been marked as taking for the 01/08/22 encounter (Appointment) with Park Liter, MD.  ?  ? ?Allergies:   Iodinated contrast media; Antihistamines, chlorpheniramine-type; and Meperidine  ? ?Social History  ? ?Socioeconomic History  ? Marital status: Widowed  ?  Spouse name: Not on file  ? Number of children: Not on file  ? Years of education: Not on file  ? Highest education level: Not on file  ?Occupational History  ? Not on file  ?Tobacco Use  ? Smoking status: Never  ? Smokeless tobacco: Never  ?Vaping Use  ? Vaping Use: Never used  ?Substance and Sexual Activity  ? Alcohol use: No  ? Drug use: No  ? Sexual activity: Not on file  ?Other Topics Concern  ? Not on file  ?Social History Narrative  ? Not on  file  ? ?Social Determinants of Health  ? ?Financial Resource Strain: Not on file  ?Food Insecurity: Not on file  ?Transportation Needs: Not on file  ?Physical Activity: Not on file  ?Stress: Not on file  ?Social Connections: Not on file  ?  ? ?Family History: ?The patient's family history includes Aneurysm in her father; Kidney failure in her mother; Stroke in her mother. There is no history of Colon cancer. ?ROS:   ?Please see the history of present illness.    ?All 14 point review of systems negative except as described per history of present illness ? ?EKGs/Labs/Other Studies Reviewed:   ? ? ? ?Recent Labs: ?No results  found for requested labs within last 8760 hours.  ?Recent Lipid Panel ?No results found for: CHOL, TRIG, HDL, CHOLHDL, VLDL, LDLCALC, LDLDIRECT ? ?Physical Exam:   ? ?VS:  There were no vitals taken for this visit.   ? ?Wt Readings from Last 3 Encounters:  ?05/01/20 120 lb (54.4 kg)  ?10/04/19 120 lb (54.4 kg)  ?02/07/19 115 lb (52.2 kg)  ?  ? ?GEN:  Well nourished, well developed in no acute distress ?HEENT: Normal ?NECK: No JVD; No carotid bruits ?LYMPHATICS: No lymphadenopathy ?CARDIAC: RRR, no murmurs, no rubs, no gallops ?RESPIRATORY:  Clear to auscultation without rales, wheezing or rhonchi  ?ABDOMEN: Soft, non-tender, non-distended ?MUSCULOSKELETAL:  No edema; No deformity  ?SKIN: Warm and dry ?LOWER EXTREMITIES: no swelling ?NEUROLOGIC:  Alert and oriented x 3 ?PSYCHIATRIC:  Normal affect  ? ?ASSESSMENT:   ? ?1. Permanent atrial fibrillation ([place])   ?2. Bilateral carotid artery stenosis   ?3. Hx of CABG   ?4. S/P AV nodal ablation   ?5. Pacemaker   ?6. Mixed hyperlipidemia   ? ?PLAN:   ? ?In order of problems listed above: ? ?Permanent atrial fibrillation, she is anticoagulated with Eliquis which I will continue.  Dose is appropriate for her weight which is less than 60 kg, age more than 82 therefore we will continue 2.5 mg twice daily.  She denies have any palpitations. ?Cortical she will stenosis only mild few years ago.  We will make arrangements for carotic ultrasounds to be repeated. ?Status post AV nodal ablation.  Noted. ?Pacemaker present is a Medtronic device.  Reaching ERI she is scheduled to have remotely interrogated pacemaker and on 31 March of this year. ?Mixed dyslipidemia I did review K PN done by primary care physician LDL shows 61 HDL 45 this is from February of last year. ?I will ask her to have an echocardiogram to assess left ventricle ejection fraction, ? ? ?Medication Adjustments/Labs and Tests Ordered: ?Current medicines are reviewed at length with the patient today.  Concerns  regarding medicines are outlined above.  ?No orders of the defined types were placed in this encounter. ? ?Medication changes: No orders of the defined types were placed in this encounter. ? ? ?Signed, ?Park Liter, MD, [facility] ?01/08/2022 2:49 PM    ?[place] ?

## 2022-01-08 NOTE — Addendum Note (Signed)
Addended by: Edwyna Shell I on: 01/08/2022 04:11 PM ? ? Modules accepted: Orders ? ?

## 2022-01-14 ENCOUNTER — Other Ambulatory Visit: Payer: Medicare HMO

## 2022-01-16 DIAGNOSIS — I639 Cerebral infarction, unspecified: Secondary | ICD-10-CM | POA: Diagnosis not present

## 2022-01-16 DIAGNOSIS — N3281 Overactive bladder: Secondary | ICD-10-CM | POA: Diagnosis not present

## 2022-01-23 DIAGNOSIS — D649 Anemia, unspecified: Secondary | ICD-10-CM | POA: Diagnosis not present

## 2022-01-23 DIAGNOSIS — R6889 Other general symptoms and signs: Secondary | ICD-10-CM | POA: Diagnosis not present

## 2022-01-23 DIAGNOSIS — E559 Vitamin D deficiency, unspecified: Secondary | ICD-10-CM | POA: Diagnosis not present

## 2022-01-23 DIAGNOSIS — Z79899 Other long term (current) drug therapy: Secondary | ICD-10-CM | POA: Diagnosis not present

## 2022-01-24 ENCOUNTER — Telehealth: Payer: Self-pay

## 2022-01-24 ENCOUNTER — Other Ambulatory Visit: Payer: Medicare HMO

## 2022-01-24 LAB — CUP PACEART REMOTE DEVICE CHECK
Battery Remaining Longevity: 5 mo
Battery Voltage: 2.84 V
Brady Statistic AP VP Percent: 0 %
Brady Statistic AP VS Percent: 0 %
Brady Statistic AS VP Percent: 100 %
Brady Statistic AS VS Percent: 0 %
Brady Statistic RA Percent Paced: 0 %
Brady Statistic RV Percent Paced: 99.67 %
Date Time Interrogation Session: 20230331164609
Implantable Lead Implant Date: 20150916
Implantable Lead Implant Date: 20150916
Implantable Lead Implant Date: 20150916
Implantable Lead Location: 753859
Implantable Lead Location: 753860
Implantable Lead Location: 753860
Implantable Lead Model: 5076
Implantable Lead Model: 5076
Implantable Lead Model: 5076
Implantable Pulse Generator Implant Date: 20150916
Lead Channel Impedance Value: 228 Ohm
Lead Channel Impedance Value: 323 Ohm
Lead Channel Impedance Value: 361 Ohm
Lead Channel Impedance Value: 418 Ohm
Lead Channel Impedance Value: 437 Ohm
Lead Channel Impedance Value: 437 Ohm
Lead Channel Impedance Value: 494 Ohm
Lead Channel Impedance Value: 532 Ohm
Lead Channel Impedance Value: 589 Ohm
Lead Channel Pacing Threshold Amplitude: 0.625 V
Lead Channel Pacing Threshold Amplitude: 1.125 V
Lead Channel Pacing Threshold Amplitude: 4 V
Lead Channel Pacing Threshold Pulse Width: 0.4 ms
Lead Channel Pacing Threshold Pulse Width: 0.4 ms
Lead Channel Pacing Threshold Pulse Width: 0.4 ms
Lead Channel Sensing Intrinsic Amplitude: 18.25 mV
Lead Channel Sensing Intrinsic Amplitude: 18.25 mV
Lead Channel Sensing Intrinsic Amplitude: 3.75 mV
Lead Channel Sensing Intrinsic Amplitude: 3.75 mV
Lead Channel Setting Pacing Amplitude: 2 V
Lead Channel Setting Pacing Amplitude: 2.5 V
Lead Channel Setting Pacing Pulse Width: 0.4 ms
Lead Channel Setting Pacing Pulse Width: 0.4 ms
Lead Channel Setting Sensing Sensitivity: 4 mV

## 2022-01-24 NOTE — Telephone Encounter (Signed)
Outreach made to [employer] (daughter). ? ?Advised that Pt was nearing ERI for her PPM and would be changing her remote monitoring to monthly. ? ?Pt is overdue for f/u with Dr. Curt Bears in [place]. ? ?Appt scheduled for Mar 03, 2022 at 12:30 pm. ? ?Daughter indicates understanding/agreement. ? ? ?

## 2022-01-28 ENCOUNTER — Other Ambulatory Visit: Payer: Medicare HMO

## 2022-02-04 DIAGNOSIS — F419 Anxiety disorder, unspecified: Secondary | ICD-10-CM | POA: Diagnosis not present

## 2022-02-04 DIAGNOSIS — Z8679 Personal history of other diseases of the circulatory system: Secondary | ICD-10-CM | POA: Diagnosis not present

## 2022-02-04 DIAGNOSIS — M1990 Unspecified osteoarthritis, unspecified site: Secondary | ICD-10-CM | POA: Diagnosis not present

## 2022-02-04 DIAGNOSIS — D509 Iron deficiency anemia, unspecified: Secondary | ICD-10-CM | POA: Diagnosis not present

## 2022-02-04 DIAGNOSIS — M7989 Other specified soft tissue disorders: Secondary | ICD-10-CM | POA: Diagnosis not present

## 2022-02-04 DIAGNOSIS — E782 Mixed hyperlipidemia: Secondary | ICD-10-CM | POA: Diagnosis not present

## 2022-02-12 ENCOUNTER — Other Ambulatory Visit: Payer: Medicare HMO

## 2022-02-18 DIAGNOSIS — N3281 Overactive bladder: Secondary | ICD-10-CM | POA: Diagnosis not present

## 2022-02-18 DIAGNOSIS — I639 Cerebral infarction, unspecified: Secondary | ICD-10-CM | POA: Diagnosis not present

## 2022-02-24 ENCOUNTER — Ambulatory Visit (INDEPENDENT_AMBULATORY_CARE_PROVIDER_SITE_OTHER): Payer: Medicare HMO

## 2022-02-24 DIAGNOSIS — I442 Atrioventricular block, complete: Secondary | ICD-10-CM

## 2022-02-25 LAB — CUP PACEART REMOTE DEVICE CHECK
Battery Remaining Longevity: 5 mo
Battery Voltage: 2.83 V
Brady Statistic AP VP Percent: 0 %
Brady Statistic AP VS Percent: 0 %
Brady Statistic AS VP Percent: 100 %
Brady Statistic AS VS Percent: 0 %
Brady Statistic RA Percent Paced: 0 %
Brady Statistic RV Percent Paced: 99.89 %
Date Time Interrogation Session: 20230502165039
Implantable Lead Implant Date: 20150916
Implantable Lead Implant Date: 20150916
Implantable Lead Implant Date: 20150916
Implantable Lead Location: 753859
Implantable Lead Location: 753860
Implantable Lead Location: 753860
Implantable Lead Model: 5076
Implantable Lead Model: 5076
Implantable Lead Model: 5076
Implantable Pulse Generator Implant Date: 20150916
Lead Channel Impedance Value: 228 Ohm
Lead Channel Impedance Value: 323 Ohm
Lead Channel Impedance Value: 342 Ohm
Lead Channel Impedance Value: 437 Ohm
Lead Channel Impedance Value: 437 Ohm
Lead Channel Impedance Value: 513 Ohm
Lead Channel Impedance Value: 532 Ohm
Lead Channel Impedance Value: 570 Ohm
Lead Channel Impedance Value: 589 Ohm
Lead Channel Pacing Threshold Amplitude: 0.625 V
Lead Channel Pacing Threshold Amplitude: 1.25 V
Lead Channel Pacing Threshold Amplitude: 4 V
Lead Channel Pacing Threshold Pulse Width: 0.4 ms
Lead Channel Pacing Threshold Pulse Width: 0.4 ms
Lead Channel Pacing Threshold Pulse Width: 0.4 ms
Lead Channel Sensing Intrinsic Amplitude: 18.25 mV
Lead Channel Sensing Intrinsic Amplitude: 18.25 mV
Lead Channel Sensing Intrinsic Amplitude: 3.75 mV
Lead Channel Sensing Intrinsic Amplitude: 3.75 mV
Lead Channel Setting Pacing Amplitude: 2 V
Lead Channel Setting Pacing Amplitude: 2.5 V
Lead Channel Setting Pacing Pulse Width: 0.4 ms
Lead Channel Setting Pacing Pulse Width: 0.4 ms
Lead Channel Setting Sensing Sensitivity: 4 mV

## 2022-02-26 ENCOUNTER — Ambulatory Visit (INDEPENDENT_AMBULATORY_CARE_PROVIDER_SITE_OTHER): Payer: Medicare HMO

## 2022-02-26 DIAGNOSIS — Z950 Presence of cardiac pacemaker: Secondary | ICD-10-CM | POA: Diagnosis not present

## 2022-02-26 DIAGNOSIS — I4821 Permanent atrial fibrillation: Secondary | ICD-10-CM | POA: Diagnosis not present

## 2022-02-26 DIAGNOSIS — E782 Mixed hyperlipidemia: Secondary | ICD-10-CM | POA: Diagnosis not present

## 2022-02-26 DIAGNOSIS — Z9889 Other specified postprocedural states: Secondary | ICD-10-CM

## 2022-02-26 DIAGNOSIS — I6523 Occlusion and stenosis of bilateral carotid arteries: Secondary | ICD-10-CM | POA: Diagnosis not present

## 2022-02-26 DIAGNOSIS — Z951 Presence of aortocoronary bypass graft: Secondary | ICD-10-CM | POA: Diagnosis not present

## 2022-02-26 DIAGNOSIS — R6889 Other general symptoms and signs: Secondary | ICD-10-CM | POA: Diagnosis not present

## 2022-02-26 LAB — ECHOCARDIOGRAM COMPLETE
Area-P 1/2: 3.72 cm2
P 1/2 time: 428 msec
S' Lateral: 1.5 cm

## 2022-02-28 ENCOUNTER — Telehealth: Payer: Self-pay

## 2022-02-28 NOTE — Telephone Encounter (Signed)
-----   Message from Park Liter, MD sent at 02/26/2022  9:43 PM EDT ----- ?Echocardiogram showed preserved left ventricle ejection fraction, overall looks good ?

## 2022-02-28 NOTE — Telephone Encounter (Signed)
Patient notified of results.

## 2022-03-03 ENCOUNTER — Encounter: Payer: Self-pay | Admitting: Cardiology

## 2022-03-03 ENCOUNTER — Ambulatory Visit (INDEPENDENT_AMBULATORY_CARE_PROVIDER_SITE_OTHER): Payer: Medicare HMO | Admitting: Cardiology

## 2022-03-03 VITALS — BP 114/72 | HR 84 | Ht 62.0 in

## 2022-03-03 DIAGNOSIS — R6889 Other general symptoms and signs: Secondary | ICD-10-CM | POA: Diagnosis not present

## 2022-03-03 DIAGNOSIS — D6869 Other thrombophilia: Secondary | ICD-10-CM | POA: Diagnosis not present

## 2022-03-03 DIAGNOSIS — I4821 Permanent atrial fibrillation: Secondary | ICD-10-CM

## 2022-03-03 NOTE — Progress Notes (Signed)
? ?Electrophysiology Office Note ? ? ?Date:  03/03/2022  ? ?ID:  Nichole Nelson, DOB Nov 01, 1934, MRN [id number] ? ?PCP:  Nicholos Johns, MD  ?Cardiologist:  Agustin Cree ?Primary Electrophysiologist:  Jevonte Clanton Meredith Leeds, MD   ? ?No chief complaint on file. ? ?  ?History of Present Illness: ?Nichole Nelson is a 87 y.o. female who is being seen today for the evaluation of atrial fibrillation, at the request of Nicholos Johns, MD. Presenting today for electrophysiology evaluation.  ? ?She has a history significant for permanent atrial fibrillation status post Medtronic pacemaker and AV node ablation, carotid artery disease, hypertension, hyperlipidemia, coronary artery disease status post CABG.   ? ?Today, denies symptoms of palpitations, chest pain, shortness of breath, orthopnea, PND, lower extremity edema, claudication, dizziness, presyncope, syncope, bleeding, or neurologic sequela. The patient is tolerating medications without difficulties.   ? ?Past Medical History:  ?Diagnosis Date  ? Abdominal fibromatosis   ? Anemia   ? Anxiety   ? ARF (acute renal failure) ([place]) 04/20/2017  ? Atrial fibrillation ([place]) 03/09/2014  ? Carotid stenosis 03/09/2014  ? Overview:  L ICA 60-79% stenosis on 03/03/14  ? Cerebral infarction ([place]) 03/09/2014  ? Overview:  R Hemisphere  ? Chronic respiratory failure ([place])   ? COPD (chronic obstructive pulmonary disease) ([place])   ? Coronary artery disease involving native coronary artery 04/20/2018  ? CVA (cerebral vascular accident) [facility])   ? Diarrhea 04/20/2018  ? Embolic stroke involving right middle cerebral artery ([place]) 09/04/2015  ? Essential tremor 03/01/2015  ? H/O acute pancreatitis   ? History of myocardial infarction   ? HTN (hypertension) 04/23/2015  ? Hx of CABG 04/20/2018  ? Hyperlipidemia   ? Hypertension   ? Hyponatremia 04/09/2017  ? Hypothyroidism 04/09/2017  ? Iron deficiency anemia, unspecified 06/16/2019  ? Pacemaker 04/23/2015  ? S/P AV nodal ablation 04/23/2015  ? Overview:  Done  by Dr. Elonda Husky  ? Thyroid disease   ? ?Past Surgical History:  ?Procedure Laterality Date  ? ABDOMINAL HYSTERECTOMY    ? AORTIC VALVE REPAIR    ? APPENDECTOMY    ? CORONARY ARTERY BYPASS GRAFT    ? ESOPHAGOGASTRODUODENOSCOPY  01/26/2014  ? Moderate hiatal hernia. presbyesophagus. No evidence of upper GI bleeding.   ? ESOPHAGOGASTRODUODENOSCOPY  01/26/2014  ? Moderate hiatal hernia. Presbyesophagus. No evidene of upper Gi bleeding.   ? HERNIA REPAIR    ? INSERT / REPLACE / REMOVE PACEMAKER    ? Medtronic  ? ? ? ?Current Outpatient Medications  ?Medication Sig Dispense Refill  ? Cholecalciferol (VITAMIN D3) 2000 units capsule Take 1 capsule by mouth daily.    ? clonazePAM (KLONOPIN) 0.5 MG tablet Take 0.5 mg by mouth in the morning, at noon, and at bedtime.    ? Cyanocobalamin (VITAMIN B-12 PO) Take 1 capsule by mouth daily.    ? diltiazem (TIAZAC) 120 MG 24 hr capsule Take 1 capsule by mouth daily.    ? ELIQUIS 2.5 MG TABS tablet TAKE 1 TABLET BY MOUTH 2 TIMES DAILY 60 tablet 1  ? escitalopram (LEXAPRO) 10 MG tablet Take 10 mg by mouth daily.    ? fluticasone furoate-vilanterol (BREO ELLIPTA) 100-25 MCG/INH AEPB Inhale 1 puff into the lungs daily.    ? levothyroxine (SYNTHROID) 100 MCG tablet Take 100 mcg by mouth daily.    ? losartan (COZAAR) 50 MG tablet Take 1.5 tablets by mouth daily.    ? mometasone (NASONEX) 50 MCG/ACT nasal spray Place 1 spray into both  nostrils daily.    ? montelukast (SINGULAIR) 10 MG tablet Take 10 mg by mouth daily.    ? ondansetron (ZOFRAN) 4 MG tablet Take 4 mg by mouth every 8 (eight) hours as needed for nausea/vomiting.    ? Phenazopyridine HCl (AZO DINE PO) Take 1 capsule by mouth daily.    ? pravastatin (PRAVACHOL) 80 MG tablet Take 80 mg by mouth daily.    ? Probiotic Product (PROBIOTIC PO) Take 1 capsule by mouth daily.    ? traMADol (ULTRAM) 50 MG tablet Take 100 mg by mouth every 8 (eight) hours as needed for pain.    ? Vitamin D, Ergocalciferol, (DRISDOL) 1.25 MG (50000 UNIT) CAPS  capsule Take 50,000 Units by mouth every 7 (seven) days.    ? gabapentin (NEURONTIN) 100 MG capsule Take 100 mg by mouth 4 (four) times daily.    ? ?No current facility-administered medications for this visit.  ? ? ?Allergies:   Iodinated contrast media; Antihistamines, chlorpheniramine-type; and Meperidine  ? ?Social History:  The patient  reports that she has never smoked. She has never used smokeless tobacco. She reports that she does not drink alcohol and does not use drugs.  ? ?Family History:  The patient's family history includes Aneurysm in her father; Kidney failure in her mother; Stroke in her mother.  ? ?ROS:  Please see the history of present illness.   Otherwise, review of systems is positive for none.   All other systems are reviewed and negative.  ? ?PHYSICAL EXAM: ?VS:  BP 114/72   Pulse 84   Ht '5\' 2"'$  (1.575 m)   SpO2 96%   BMI 22.86 kg/m?  , BMI Body mass index is 22.86 kg/m?. ?GEN: Well nourished, well developed, in no acute distress  ?HEENT: normal  ?Neck: no JVD, carotid bruits, or masses ?Cardiac: RRR; no murmurs, rubs, or gallops,no edema  ?Respiratory:  clear to auscultation bilaterally, normal work of breathing ?GI: soft, nontender, nondistended, + BS ?MS: no deformity or atrophy  ?Skin: warm and dry, device site well healed ?Neuro:  Strength and sensation are intact ?Psych: euthymic mood, full affect ? ?EKG:  EKG is ordered today. ?Personal review of the ekg ordered shows AF, V pacing ? ?Personal review of the device interrogation today. Results in [place]  ? ? ?Recent Labs: ?No results found for requested labs within last 8760 hours.  ? ? ?Lipid Panel  ?No results found for: CHOL, TRIG, HDL, CHOLHDL, VLDL, LDLCALC, LDLDIRECT ? ? ?Wt Readings from Last 3 Encounters:  ?01/08/22 125 lb (56.7 kg)  ?05/01/20 120 lb (54.4 kg)  ?10/04/19 120 lb (54.4 kg)  ?  ? ? ?Other studies Reviewed: ?Additional studies/ records that were reviewed today include: TTE 02/26/22 ?Review of the above records today  demonstrates:  ? 1. Mild ASH, no SAm, no gradient. Left ventricular ejection fraction, by  ?estimation, is 60 to 65%. The left ventricle has normal function. The left  ?ventricle has no regional wall motion abnormalities. There is mild  ?asymmetric left ventricular  ?hypertrophy. Left ventricular diastolic parameters are indeterminate.  ? 2. Right ventricular systolic function is normal. The right ventricular  ?size is normal. There is normal pulmonary artery systolic pressure.  ? 3. Left atrial size was severely dilated.  ? 4. The mitral valve is normal in structure. No evidence of mitral valve  ?regurgitation. No evidence of mitral stenosis.  ? 5. The aortic valve is normal in structure. Aortic valve regurgitation is  ?mild. Aortic valve  sclerosis/calcification is present, without any  ?evidence of aortic stenosis.  ? 6. The inferior vena cava is normal in size with greater than 50%  ?respiratory variability, suggesting right atrial pressure of 3 mmHg.  ? ? ?ASSESSMENT AND PLAN: ? ?1.  Permanent atrial fibrillation: Currently on Eliquis 5 mg twice daily.  CHA2DS2-VASc of 4.  Was previously on amiodarone but this was stopped.  Mains in atrial fibrillation.  No changes. ? ?2.  Hypertension: Currently well controlled ? ?3.  Status post AV node ablation: Status post Medtronic pacemaker.  Device functioning appropriately.  No changes at this time.  Battery status has 4 months left.  Discussed generator change.  Risk and benefits were discussed with bleeding and infection.  She understands these risks and is agreed to the procedure. ? ?4.  Secondary hypercoagulable state: Currently on Eliquis for atrial fibrillation as above. ? ?5.  Coronary artery disease: Status post CABG.  Plan per primary cardiology. ? ?Current medicines are reviewed at length with the patient today.   ?The patient does not have concerns regarding her medicines.  The following changes were made today:  none ? ?Labs/ tests ordered today include:   ?Orders Placed This Encounter  ?Procedures  ? EKG 12-Lead  ? ? ? ?Disposition:   FU with Lelaina Oatis 1 year ? ?Signed, ?Khaidyn Staebell Meredith Leeds, MD  ?03/03/2022 1:37 PM    ? ?[facility] ?[address]

## 2022-03-03 NOTE — Patient Instructions (Signed)
Medication Instructions:  ?Your physician recommends that you continue on your current medications as directed. Please refer to the Current Medication list given to you today. ? ?*If you need a refill on your cardiac medications before your next appointment, please call your pharmacy* ? ? ?Lab Work: ?None ordered ? ? ?Testing/Procedures: ?None ordered ? ? ?Follow-Up: ?At [facility], you and your health needs are our priority.  As part of our continuing mission to provide you with exceptional heart care, we have created designated Provider Care Teams.  These Care Teams include your primary Cardiologist (physician) and Advanced Practice Providers (APPs -  Physician Assistants and Nurse Practitioners) who all work together to provide you with the care you need, when you need it. ? ?Remote monitoring is used to monitor your Pacemaker or ICD from home. This monitoring reduces the number of office visits required to check your device to one time per year. It allows [place] to keep an eye on the functioning of your device to ensure it is working properly. You are scheduled for a device check from home on 03/27/2022. You may send your transmission at any time that day. If you have a wireless device, the transmission will be sent automatically. After your physician reviews your transmission, you will receive a postcard with your next transmission date. ? ?Your next appointment:   ?To be  determined  --- will call you to arrange battery change ? ?The format for your next appointment:   ?In Person ? ?Provider:   ?Allegra Lai, MD  ? ? ?Thank you for choosing [facility]!! ? ? ?Trinidad Curet, RN ?([phone] ? ? ? ?Other Instructions ? ? ?Important Information About [place] ? ? ? ? ?  ?

## 2022-03-06 DIAGNOSIS — F419 Anxiety disorder, unspecified: Secondary | ICD-10-CM | POA: Diagnosis not present

## 2022-03-06 DIAGNOSIS — K219 Gastro-esophageal reflux disease without esophagitis: Secondary | ICD-10-CM | POA: Diagnosis not present

## 2022-03-06 DIAGNOSIS — E559 Vitamin D deficiency, unspecified: Secondary | ICD-10-CM | POA: Diagnosis not present

## 2022-03-06 DIAGNOSIS — G250 Essential tremor: Secondary | ICD-10-CM | POA: Diagnosis not present

## 2022-03-06 DIAGNOSIS — F418 Other specified anxiety disorders: Secondary | ICD-10-CM | POA: Diagnosis not present

## 2022-03-06 DIAGNOSIS — M545 Low back pain, unspecified: Secondary | ICD-10-CM | POA: Diagnosis not present

## 2022-03-06 DIAGNOSIS — I10 Essential (primary) hypertension: Secondary | ICD-10-CM | POA: Diagnosis not present

## 2022-03-06 DIAGNOSIS — Z6824 Body mass index (BMI) 24.0-24.9, adult: Secondary | ICD-10-CM | POA: Diagnosis not present

## 2022-03-06 DIAGNOSIS — G2581 Restless legs syndrome: Secondary | ICD-10-CM | POA: Diagnosis not present

## 2022-03-10 ENCOUNTER — Other Ambulatory Visit: Payer: Self-pay | Admitting: Cardiology

## 2022-03-10 DIAGNOSIS — I4821 Permanent atrial fibrillation: Secondary | ICD-10-CM

## 2022-03-11 NOTE — Telephone Encounter (Signed)
Prescription refill request for Eliquis received. ?Indication: Afib  ?Last office visit:03/03/22 ([place]) ?Scr: 1.15 (01/23/22 via [place])  ?Age: 87 ?Weight: 56.7kg ? ?Appropriate dose and refill sent to requested pharmacy.  ?

## 2022-03-11 NOTE — Progress Notes (Signed)
Remote pacemaker transmission.   

## 2022-03-11 NOTE — Addendum Note (Signed)
Addended by: Douglass Rivers D on: 03/11/2022 12:47 PM ? ? Modules accepted: Level of Service ? ?

## 2022-03-23 DIAGNOSIS — I639 Cerebral infarction, unspecified: Secondary | ICD-10-CM | POA: Diagnosis not present

## 2022-03-23 DIAGNOSIS — N3281 Overactive bladder: Secondary | ICD-10-CM | POA: Diagnosis not present

## 2022-03-27 ENCOUNTER — Ambulatory Visit (INDEPENDENT_AMBULATORY_CARE_PROVIDER_SITE_OTHER): Payer: Medicare HMO

## 2022-03-27 DIAGNOSIS — I442 Atrioventricular block, complete: Secondary | ICD-10-CM

## 2022-03-29 LAB — CUP PACEART REMOTE DEVICE CHECK
Battery Remaining Longevity: 3 mo
Battery Voltage: 2.82 V
Brady Statistic AP VP Percent: 0 %
Brady Statistic AP VS Percent: 0 %
Brady Statistic AS VP Percent: 100 %
Brady Statistic AS VS Percent: 0 %
Brady Statistic RA Percent Paced: 0 %
Brady Statistic RV Percent Paced: 99.94 %
Date Time Interrogation Session: 20230602184054
Implantable Lead Implant Date: 20150916
Implantable Lead Implant Date: 20150916
Implantable Lead Implant Date: 20150916
Implantable Lead Location: 753859
Implantable Lead Location: 753860
Implantable Lead Location: 753860
Implantable Lead Model: 5076
Implantable Lead Model: 5076
Implantable Lead Model: 5076
Implantable Pulse Generator Implant Date: 20150916
Lead Channel Impedance Value: 247 Ohm
Lead Channel Impedance Value: 323 Ohm
Lead Channel Impedance Value: 361 Ohm
Lead Channel Impedance Value: 418 Ohm
Lead Channel Impedance Value: 456 Ohm
Lead Channel Impedance Value: 494 Ohm
Lead Channel Impedance Value: 551 Ohm
Lead Channel Impedance Value: 570 Ohm
Lead Channel Impedance Value: 627 Ohm
Lead Channel Pacing Threshold Amplitude: 0.625 V
Lead Channel Pacing Threshold Amplitude: 1.25 V
Lead Channel Pacing Threshold Amplitude: 4 V
Lead Channel Pacing Threshold Pulse Width: 0.4 ms
Lead Channel Pacing Threshold Pulse Width: 0.4 ms
Lead Channel Pacing Threshold Pulse Width: 0.4 ms
Lead Channel Sensing Intrinsic Amplitude: 18.25 mV
Lead Channel Sensing Intrinsic Amplitude: 3.75 mV
Lead Channel Sensing Intrinsic Amplitude: 3.75 mV
Lead Channel Sensing Intrinsic Amplitude: 6.625 mV
Lead Channel Setting Pacing Amplitude: 2 V
Lead Channel Setting Pacing Amplitude: 2.5 V
Lead Channel Setting Pacing Pulse Width: 0.4 ms
Lead Channel Setting Pacing Pulse Width: 0.4 ms
Lead Channel Setting Sensing Sensitivity: 4 mV

## 2022-04-04 NOTE — Progress Notes (Signed)
Remote pacemaker transmission.   

## 2022-04-04 NOTE — Addendum Note (Signed)
Addended by: Cheri Kearns A on: 04/04/2022 02:07 PM   Modules accepted: Level of Service

## 2022-04-07 DIAGNOSIS — F418 Other specified anxiety disorders: Secondary | ICD-10-CM | POA: Diagnosis not present

## 2022-04-07 DIAGNOSIS — J309 Allergic rhinitis, unspecified: Secondary | ICD-10-CM | POA: Diagnosis not present

## 2022-04-07 DIAGNOSIS — Z6824 Body mass index (BMI) 24.0-24.9, adult: Secondary | ICD-10-CM | POA: Diagnosis not present

## 2022-04-07 DIAGNOSIS — M810 Age-related osteoporosis without current pathological fracture: Secondary | ICD-10-CM | POA: Diagnosis not present

## 2022-04-07 DIAGNOSIS — E039 Hypothyroidism, unspecified: Secondary | ICD-10-CM | POA: Diagnosis not present

## 2022-04-07 DIAGNOSIS — M159 Polyosteoarthritis, unspecified: Secondary | ICD-10-CM | POA: Diagnosis not present

## 2022-04-23 DIAGNOSIS — N3281 Overactive bladder: Secondary | ICD-10-CM | POA: Diagnosis not present

## 2022-04-23 DIAGNOSIS — I639 Cerebral infarction, unspecified: Secondary | ICD-10-CM | POA: Diagnosis not present

## 2022-05-07 DIAGNOSIS — E782 Mixed hyperlipidemia: Secondary | ICD-10-CM | POA: Diagnosis not present

## 2022-05-07 DIAGNOSIS — F419 Anxiety disorder, unspecified: Secondary | ICD-10-CM | POA: Diagnosis not present

## 2022-05-07 DIAGNOSIS — Z8679 Personal history of other diseases of the circulatory system: Secondary | ICD-10-CM | POA: Diagnosis not present

## 2022-05-07 DIAGNOSIS — D509 Iron deficiency anemia, unspecified: Secondary | ICD-10-CM | POA: Diagnosis not present

## 2022-05-07 DIAGNOSIS — M7989 Other specified soft tissue disorders: Secondary | ICD-10-CM | POA: Diagnosis not present

## 2022-05-07 DIAGNOSIS — M1990 Unspecified osteoarthritis, unspecified site: Secondary | ICD-10-CM | POA: Diagnosis not present

## 2022-05-14 DIAGNOSIS — L89159 Pressure ulcer of sacral region, unspecified stage: Secondary | ICD-10-CM | POA: Diagnosis not present

## 2022-05-22 DIAGNOSIS — N3281 Overactive bladder: Secondary | ICD-10-CM | POA: Diagnosis not present

## 2022-05-22 DIAGNOSIS — I639 Cerebral infarction, unspecified: Secondary | ICD-10-CM | POA: Diagnosis not present

## 2022-05-29 ENCOUNTER — Ambulatory Visit (INDEPENDENT_AMBULATORY_CARE_PROVIDER_SITE_OTHER): Payer: Medicare HMO

## 2022-05-29 DIAGNOSIS — I442 Atrioventricular block, complete: Secondary | ICD-10-CM

## 2022-06-02 LAB — CUP PACEART REMOTE DEVICE CHECK
Battery Remaining Longevity: 3 mo
Battery Voltage: 2.8 V
Brady Statistic AP VP Percent: 0 %
Brady Statistic AP VS Percent: 0 %
Brady Statistic AS VP Percent: 100 %
Brady Statistic AS VS Percent: 0 %
Brady Statistic RA Percent Paced: 0 %
Brady Statistic RV Percent Paced: 99.91 %
Date Time Interrogation Session: 20230805165411
Implantable Lead Implant Date: 20150916
Implantable Lead Implant Date: 20150916
Implantable Lead Implant Date: 20150916
Implantable Lead Location: 753859
Implantable Lead Location: 753860
Implantable Lead Location: 753860
Implantable Lead Model: 5076
Implantable Lead Model: 5076
Implantable Lead Model: 5076
Implantable Pulse Generator Implant Date: 20150916
Lead Channel Impedance Value: 228 Ohm
Lead Channel Impedance Value: 323 Ohm
Lead Channel Impedance Value: 361 Ohm
Lead Channel Impedance Value: 418 Ohm
Lead Channel Impedance Value: 437 Ohm
Lead Channel Impedance Value: 475 Ohm
Lead Channel Impedance Value: 532 Ohm
Lead Channel Impedance Value: 551 Ohm
Lead Channel Impedance Value: 627 Ohm
Lead Channel Pacing Threshold Amplitude: 0.625 V
Lead Channel Pacing Threshold Amplitude: 1.25 V
Lead Channel Pacing Threshold Amplitude: 4 V
Lead Channel Pacing Threshold Pulse Width: 0.4 ms
Lead Channel Pacing Threshold Pulse Width: 0.4 ms
Lead Channel Pacing Threshold Pulse Width: 0.4 ms
Lead Channel Sensing Intrinsic Amplitude: 18.25 mV
Lead Channel Sensing Intrinsic Amplitude: 3.75 mV
Lead Channel Sensing Intrinsic Amplitude: 3.75 mV
Lead Channel Sensing Intrinsic Amplitude: 6.625 mV
Lead Channel Setting Pacing Amplitude: 2 V
Lead Channel Setting Pacing Amplitude: 2.5 V
Lead Channel Setting Pacing Pulse Width: 0.4 ms
Lead Channel Setting Pacing Pulse Width: 0.4 ms
Lead Channel Setting Sensing Sensitivity: 4 mV

## 2022-06-11 DIAGNOSIS — Z6824 Body mass index (BMI) 24.0-24.9, adult: Secondary | ICD-10-CM | POA: Diagnosis not present

## 2022-06-11 DIAGNOSIS — N390 Urinary tract infection, site not specified: Secondary | ICD-10-CM | POA: Diagnosis not present

## 2022-06-11 DIAGNOSIS — L89159 Pressure ulcer of sacral region, unspecified stage: Secondary | ICD-10-CM | POA: Diagnosis not present

## 2022-06-17 DIAGNOSIS — Z139 Encounter for screening, unspecified: Secondary | ICD-10-CM | POA: Diagnosis not present

## 2022-06-17 DIAGNOSIS — Z9181 History of falling: Secondary | ICD-10-CM | POA: Diagnosis not present

## 2022-06-17 DIAGNOSIS — Z1331 Encounter for screening for depression: Secondary | ICD-10-CM | POA: Diagnosis not present

## 2022-06-17 DIAGNOSIS — Z0000 Encounter for general adult medical examination without abnormal findings: Secondary | ICD-10-CM | POA: Diagnosis not present

## 2022-06-19 NOTE — Progress Notes (Signed)
Remote pacemaker transmission.   

## 2022-06-19 NOTE — Addendum Note (Signed)
Addended by: Cheri Kearns A on: 06/19/2022 02:31 PM   Modules accepted: Level of Service

## 2022-06-20 DIAGNOSIS — I639 Cerebral infarction, unspecified: Secondary | ICD-10-CM | POA: Diagnosis not present

## 2022-06-20 DIAGNOSIS — N3281 Overactive bladder: Secondary | ICD-10-CM | POA: Diagnosis not present

## 2022-07-01 ENCOUNTER — Ambulatory Visit (INDEPENDENT_AMBULATORY_CARE_PROVIDER_SITE_OTHER): Payer: Medicare HMO

## 2022-07-01 DIAGNOSIS — I442 Atrioventricular block, complete: Secondary | ICD-10-CM

## 2022-07-03 LAB — CUP PACEART REMOTE DEVICE CHECK
Battery Remaining Longevity: 3 mo
Battery Voltage: 2.79 V
Brady Statistic AP VP Percent: 0 %
Brady Statistic AP VS Percent: 0 %
Brady Statistic AS VP Percent: 100 %
Brady Statistic AS VS Percent: 0 %
Brady Statistic RA Percent Paced: 0 %
Brady Statistic RV Percent Paced: 99.73 %
Date Time Interrogation Session: 20230906190706
Implantable Lead Implant Date: 20150916
Implantable Lead Implant Date: 20150916
Implantable Lead Implant Date: 20150916
Implantable Lead Location: 753859
Implantable Lead Location: 753860
Implantable Lead Location: 753860
Implantable Lead Model: 5076
Implantable Lead Model: 5076
Implantable Lead Model: 5076
Implantable Pulse Generator Implant Date: 20150916
Lead Channel Impedance Value: 228 Ohm
Lead Channel Impedance Value: 323 Ohm
Lead Channel Impedance Value: 342 Ohm
Lead Channel Impedance Value: 399 Ohm
Lead Channel Impedance Value: 418 Ohm
Lead Channel Impedance Value: 513 Ohm
Lead Channel Impedance Value: 513 Ohm
Lead Channel Impedance Value: 570 Ohm
Lead Channel Impedance Value: 627 Ohm
Lead Channel Pacing Threshold Amplitude: 0.625 V
Lead Channel Pacing Threshold Amplitude: 1.375 V
Lead Channel Pacing Threshold Amplitude: 4 V
Lead Channel Pacing Threshold Pulse Width: 0.4 ms
Lead Channel Pacing Threshold Pulse Width: 0.4 ms
Lead Channel Pacing Threshold Pulse Width: 0.4 ms
Lead Channel Sensing Intrinsic Amplitude: 18.25 mV
Lead Channel Sensing Intrinsic Amplitude: 3.75 mV
Lead Channel Sensing Intrinsic Amplitude: 3.75 mV
Lead Channel Sensing Intrinsic Amplitude: 6.625 mV
Lead Channel Setting Pacing Amplitude: 2 V
Lead Channel Setting Pacing Amplitude: 2.75 V
Lead Channel Setting Pacing Pulse Width: 0.4 ms
Lead Channel Setting Pacing Pulse Width: 0.4 ms
Lead Channel Setting Sensing Sensitivity: 4 mV

## 2022-07-04 ENCOUNTER — Telehealth: Payer: Self-pay

## 2022-07-04 NOTE — Telephone Encounter (Signed)
Spoke to patients son about increased monthly battery. Dates given to son. Advised if he has further questions or concerns to call back. Voiced understanding.

## 2022-07-09 DIAGNOSIS — M159 Polyosteoarthritis, unspecified: Secondary | ICD-10-CM | POA: Diagnosis not present

## 2022-07-09 DIAGNOSIS — M810 Age-related osteoporosis without current pathological fracture: Secondary | ICD-10-CM | POA: Diagnosis not present

## 2022-07-09 DIAGNOSIS — J309 Allergic rhinitis, unspecified: Secondary | ICD-10-CM | POA: Diagnosis not present

## 2022-07-09 DIAGNOSIS — E039 Hypothyroidism, unspecified: Secondary | ICD-10-CM | POA: Diagnosis not present

## 2022-07-09 DIAGNOSIS — F418 Other specified anxiety disorders: Secondary | ICD-10-CM | POA: Diagnosis not present

## 2022-07-09 DIAGNOSIS — Z6824 Body mass index (BMI) 24.0-24.9, adult: Secondary | ICD-10-CM | POA: Diagnosis not present

## 2022-07-14 ENCOUNTER — Ambulatory Visit: Payer: Medicare HMO | Attending: Cardiology | Admitting: Cardiology

## 2022-07-23 NOTE — Addendum Note (Signed)
Addended by: Douglass Rivers D on: 07/23/2022 08:35 PM   Modules accepted: Level of Service

## 2022-07-23 NOTE — Progress Notes (Signed)
Remote pacemaker transmission.   

## 2022-07-24 DIAGNOSIS — I639 Cerebral infarction, unspecified: Secondary | ICD-10-CM | POA: Diagnosis not present

## 2022-07-24 DIAGNOSIS — N3281 Overactive bladder: Secondary | ICD-10-CM | POA: Diagnosis not present

## 2022-08-04 ENCOUNTER — Ambulatory Visit (INDEPENDENT_AMBULATORY_CARE_PROVIDER_SITE_OTHER): Payer: Medicare HMO

## 2022-08-04 DIAGNOSIS — I442 Atrioventricular block, complete: Secondary | ICD-10-CM

## 2022-08-05 LAB — CUP PACEART REMOTE DEVICE CHECK
Battery Remaining Longevity: 2 mo
Battery Voltage: 2.77 V
Brady Statistic AP VP Percent: 0 %
Brady Statistic AP VS Percent: 0 %
Brady Statistic AS VP Percent: 100 %
Brady Statistic AS VS Percent: 0 %
Brady Statistic RA Percent Paced: 0 %
Brady Statistic RV Percent Paced: 99.58 %
Date Time Interrogation Session: 20231010155309
Implantable Lead Implant Date: 20150916
Implantable Lead Implant Date: 20150916
Implantable Lead Implant Date: 20150916
Implantable Lead Location: 753859
Implantable Lead Location: 753860
Implantable Lead Location: 753860
Implantable Lead Model: 5076
Implantable Lead Model: 5076
Implantable Lead Model: 5076
Implantable Pulse Generator Implant Date: 20150916
Lead Channel Impedance Value: 228 Ohm
Lead Channel Impedance Value: 323 Ohm
Lead Channel Impedance Value: 342 Ohm
Lead Channel Impedance Value: 418 Ohm
Lead Channel Impedance Value: 418 Ohm
Lead Channel Impedance Value: 475 Ohm
Lead Channel Impedance Value: 513 Ohm
Lead Channel Impedance Value: 532 Ohm
Lead Channel Impedance Value: 570 Ohm
Lead Channel Pacing Threshold Amplitude: 0.625 V
Lead Channel Pacing Threshold Amplitude: 1.125 V
Lead Channel Pacing Threshold Amplitude: 4 V
Lead Channel Pacing Threshold Pulse Width: 0.4 ms
Lead Channel Pacing Threshold Pulse Width: 0.4 ms
Lead Channel Pacing Threshold Pulse Width: 0.4 ms
Lead Channel Sensing Intrinsic Amplitude: 18.25 mV
Lead Channel Sensing Intrinsic Amplitude: 3.75 mV
Lead Channel Sensing Intrinsic Amplitude: 3.75 mV
Lead Channel Sensing Intrinsic Amplitude: 6.625 mV
Lead Channel Setting Pacing Amplitude: 2 V
Lead Channel Setting Pacing Amplitude: 2.5 V
Lead Channel Setting Pacing Pulse Width: 0.4 ms
Lead Channel Setting Pacing Pulse Width: 0.4 ms
Lead Channel Setting Sensing Sensitivity: 4 mV

## 2022-08-08 DIAGNOSIS — M7989 Other specified soft tissue disorders: Secondary | ICD-10-CM | POA: Diagnosis not present

## 2022-08-08 DIAGNOSIS — M1990 Unspecified osteoarthritis, unspecified site: Secondary | ICD-10-CM | POA: Diagnosis not present

## 2022-08-08 DIAGNOSIS — E782 Mixed hyperlipidemia: Secondary | ICD-10-CM | POA: Diagnosis not present

## 2022-08-08 DIAGNOSIS — Z8679 Personal history of other diseases of the circulatory system: Secondary | ICD-10-CM | POA: Diagnosis not present

## 2022-08-08 DIAGNOSIS — F419 Anxiety disorder, unspecified: Secondary | ICD-10-CM | POA: Diagnosis not present

## 2022-08-08 DIAGNOSIS — D509 Iron deficiency anemia, unspecified: Secondary | ICD-10-CM | POA: Diagnosis not present

## 2022-08-20 NOTE — Addendum Note (Signed)
Addended by: Cheri Kearns A on: 08/20/2022 03:07 PM   Modules accepted: Level of Service

## 2022-08-20 NOTE — Progress Notes (Signed)
Remote pacemaker transmission.   

## 2022-08-21 DIAGNOSIS — N3281 Overactive bladder: Secondary | ICD-10-CM | POA: Diagnosis not present

## 2022-08-21 DIAGNOSIS — I639 Cerebral infarction, unspecified: Secondary | ICD-10-CM | POA: Diagnosis not present

## 2022-08-29 DIAGNOSIS — R6889 Other general symptoms and signs: Secondary | ICD-10-CM | POA: Diagnosis not present

## 2022-09-02 DIAGNOSIS — N390 Urinary tract infection, site not specified: Secondary | ICD-10-CM | POA: Diagnosis not present

## 2022-09-02 DIAGNOSIS — Z6824 Body mass index (BMI) 24.0-24.9, adult: Secondary | ICD-10-CM | POA: Diagnosis not present

## 2022-09-04 ENCOUNTER — Ambulatory Visit (INDEPENDENT_AMBULATORY_CARE_PROVIDER_SITE_OTHER): Payer: Medicare HMO

## 2022-09-04 DIAGNOSIS — I442 Atrioventricular block, complete: Secondary | ICD-10-CM

## 2022-09-06 LAB — CUP PACEART REMOTE DEVICE CHECK
Battery Remaining Longevity: 2 mo
Battery Voltage: 2.76 V
Brady Statistic AP VP Percent: 0 %
Brady Statistic AP VS Percent: 0 %
Brady Statistic AS VP Percent: 100 %
Brady Statistic AS VS Percent: 0 %
Brady Statistic RA Percent Paced: 0 %
Brady Statistic RV Percent Paced: 99.78 %
Date Time Interrogation Session: 20231109173211
Implantable Lead Connection Status: 753985
Implantable Lead Connection Status: 753985
Implantable Lead Connection Status: 753985
Implantable Lead Implant Date: 20150916
Implantable Lead Implant Date: 20150916
Implantable Lead Implant Date: 20150916
Implantable Lead Location: 753859
Implantable Lead Location: 753860
Implantable Lead Location: 753860
Implantable Lead Model: 5076
Implantable Lead Model: 5076
Implantable Lead Model: 5076
Implantable Pulse Generator Implant Date: 20150916
Lead Channel Impedance Value: 228 Ohm
Lead Channel Impedance Value: 323 Ohm
Lead Channel Impedance Value: 342 Ohm
Lead Channel Impedance Value: 418 Ohm
Lead Channel Impedance Value: 418 Ohm
Lead Channel Impedance Value: 418 Ohm
Lead Channel Impedance Value: 475 Ohm
Lead Channel Impedance Value: 513 Ohm
Lead Channel Impedance Value: 551 Ohm
Lead Channel Pacing Threshold Amplitude: 0.625 V
Lead Channel Pacing Threshold Amplitude: 1.25 V
Lead Channel Pacing Threshold Amplitude: 3 V
Lead Channel Pacing Threshold Pulse Width: 0.4 ms
Lead Channel Pacing Threshold Pulse Width: 0.4 ms
Lead Channel Pacing Threshold Pulse Width: 0.4 ms
Lead Channel Sensing Intrinsic Amplitude: 18.25 mV
Lead Channel Sensing Intrinsic Amplitude: 3.75 mV
Lead Channel Sensing Intrinsic Amplitude: 3.75 mV
Lead Channel Sensing Intrinsic Amplitude: 6.625 mV
Lead Channel Setting Pacing Amplitude: 2 V
Lead Channel Setting Pacing Amplitude: 2.75 V
Lead Channel Setting Pacing Pulse Width: 0.4 ms
Lead Channel Setting Pacing Pulse Width: 0.4 ms
Lead Channel Setting Sensing Sensitivity: 4 mV
Zone Setting Status: 755011
Zone Setting Status: 755011

## 2022-09-08 ENCOUNTER — Telehealth: Payer: Self-pay

## 2022-09-08 DIAGNOSIS — G250 Essential tremor: Secondary | ICD-10-CM | POA: Diagnosis not present

## 2022-09-08 DIAGNOSIS — M545 Low back pain, unspecified: Secondary | ICD-10-CM | POA: Diagnosis not present

## 2022-09-08 DIAGNOSIS — G2581 Restless legs syndrome: Secondary | ICD-10-CM | POA: Diagnosis not present

## 2022-09-08 DIAGNOSIS — I10 Essential (primary) hypertension: Secondary | ICD-10-CM | POA: Diagnosis not present

## 2022-09-08 DIAGNOSIS — K219 Gastro-esophageal reflux disease without esophagitis: Secondary | ICD-10-CM | POA: Diagnosis not present

## 2022-09-08 DIAGNOSIS — Z6824 Body mass index (BMI) 24.0-24.9, adult: Secondary | ICD-10-CM | POA: Diagnosis not present

## 2022-09-08 NOTE — Telephone Encounter (Signed)
PPM reached ERI 08/17/2022. Patient called made aware someone will reach out with scheduling. Patient had discuss about gen change at apt with Dr. Curt Bears on 03/03/2022. Patient voiced understanding.

## 2022-09-09 ENCOUNTER — Other Ambulatory Visit: Payer: Self-pay

## 2022-09-09 DIAGNOSIS — I4821 Permanent atrial fibrillation: Secondary | ICD-10-CM

## 2022-09-09 NOTE — Telephone Encounter (Signed)
I called pt to schedule her Gen Change. I spoke with her Son Harrie Jeans) he is not listed on her DPR but she was in the background. I advised them to update it when in the office.  She is scheduled for 10/15/22 @ 4:30.   She will have labwork done in the [place] office the week of 12/4... she will get her scrub while there and also a copy of her Instruction letter.

## 2022-09-11 NOTE — Progress Notes (Signed)
Remote pacemaker transmission.   

## 2022-09-11 NOTE — Addendum Note (Signed)
Addended by: Cheri Kearns A on: 09/11/2022 02:56 PM   Modules accepted: Level of Service

## 2022-09-15 DIAGNOSIS — N390 Urinary tract infection, site not specified: Secondary | ICD-10-CM | POA: Diagnosis not present

## 2022-09-15 DIAGNOSIS — N343 Urethral syndrome, unspecified: Secondary | ICD-10-CM | POA: Diagnosis not present

## 2022-09-15 DIAGNOSIS — Z6824 Body mass index (BMI) 24.0-24.9, adult: Secondary | ICD-10-CM | POA: Diagnosis not present

## 2022-09-16 DIAGNOSIS — N343 Urethral syndrome, unspecified: Secondary | ICD-10-CM | POA: Diagnosis not present

## 2022-09-23 DIAGNOSIS — I639 Cerebral infarction, unspecified: Secondary | ICD-10-CM | POA: Diagnosis not present

## 2022-09-23 DIAGNOSIS — N3281 Overactive bladder: Secondary | ICD-10-CM | POA: Diagnosis not present

## 2022-10-01 ENCOUNTER — Other Ambulatory Visit: Payer: Self-pay

## 2022-10-01 DIAGNOSIS — I4821 Permanent atrial fibrillation: Secondary | ICD-10-CM

## 2022-10-01 MED ORDER — APIXABAN 2.5 MG PO TABS: 2.5000 mg | ORAL_TABLET | Freq: Two times a day (BID) | ORAL | 5 refills | Status: DC

## 2022-10-01 NOTE — Telephone Encounter (Signed)
Prescription refill request for Eliquis received. Indication: Afib  Last office visit: 03/03/22 ([place])  Scr: 1.15 (01/23/22 via [place]) Age: 87 Weight: 56.7kg  Appropriate dose and refill sent to requested pharmacy.

## 2022-10-02 ENCOUNTER — Other Ambulatory Visit: Payer: Medicare HMO

## 2022-10-02 DIAGNOSIS — I4821 Permanent atrial fibrillation: Secondary | ICD-10-CM | POA: Diagnosis not present

## 2022-10-02 DIAGNOSIS — R6889 Other general symptoms and signs: Secondary | ICD-10-CM | POA: Diagnosis not present

## 2022-10-03 LAB — BASIC METABOLIC PANEL
BUN/Creatinine Ratio: 12 (ref 12–28)
BUN: 13 mg/dL (ref 8–27)
CO2: 21 mmol/L (ref 20–29)
Calcium: 9.1 mg/dL (ref 8.7–10.3)
Chloride: 105 mmol/L (ref 96–106)
Creatinine, Ser: 1.06 mg/dL — ABNORMAL HIGH (ref 0.57–1.00)
Glucose: 112 mg/dL — ABNORMAL HIGH (ref 70–99)
Potassium: 4.4 mmol/L (ref 3.5–5.2)
Sodium: 142 mmol/L (ref 134–144)
eGFR: 51 mL/min/{1.73_m2} — ABNORMAL LOW (ref >59)

## 2022-10-03 LAB — CBC
Hematocrit: 29.3 % — ABNORMAL LOW (ref 34.0–46.6)
Hemoglobin: 8.9 g/dL — ABNORMAL LOW (ref 11.1–15.9)
MCH: 25.9 pg — ABNORMAL LOW (ref 26.6–33.0)
MCHC: 30.4 g/dL — ABNORMAL LOW (ref 31.5–35.7)
MCV: 85 fL (ref 79–97)
Platelets: 326 10*3/uL (ref 150–450)
RBC: 3.43 x10E6/uL — ABNORMAL LOW (ref 3.77–5.28)
RDW: 16.1 % — ABNORMAL HIGH (ref 11.7–15.4)
WBC: 9.6 10*3/uL (ref 3.4–10.8)

## 2022-10-06 ENCOUNTER — Ambulatory Visit (INDEPENDENT_AMBULATORY_CARE_PROVIDER_SITE_OTHER): Payer: Medicare HMO

## 2022-10-06 DIAGNOSIS — I442 Atrioventricular block, complete: Secondary | ICD-10-CM

## 2022-10-07 LAB — CUP PACEART REMOTE DEVICE CHECK
Battery Remaining Longevity: 2 mo
Battery Voltage: 2.74 V
Brady Statistic AP VP Percent: 0 %
Brady Statistic AP VS Percent: 0 %
Brady Statistic AS VP Percent: 100 %
Brady Statistic AS VS Percent: 0 %
Brady Statistic RA Percent Paced: 0 %
Brady Statistic RV Percent Paced: 99.77 %
Date Time Interrogation Session: 20231212173418
Implantable Lead Connection Status: 753985
Implantable Lead Connection Status: 753985
Implantable Lead Connection Status: 753985
Implantable Lead Implant Date: 20150916
Implantable Lead Implant Date: 20150916
Implantable Lead Implant Date: 20150916
Implantable Lead Location: 753859
Implantable Lead Location: 753860
Implantable Lead Location: 753860
Implantable Lead Model: 5076
Implantable Lead Model: 5076
Implantable Lead Model: 5076
Implantable Pulse Generator Implant Date: 20150916
Lead Channel Impedance Value: 228 Ohm
Lead Channel Impedance Value: 323 Ohm
Lead Channel Impedance Value: 342 Ohm
Lead Channel Impedance Value: 418 Ohm
Lead Channel Impedance Value: 418 Ohm
Lead Channel Impedance Value: 456 Ohm
Lead Channel Impedance Value: 494 Ohm
Lead Channel Impedance Value: 551 Ohm
Lead Channel Impedance Value: 608 Ohm
Lead Channel Pacing Threshold Amplitude: 0.625 V
Lead Channel Pacing Threshold Amplitude: 1.125 V
Lead Channel Pacing Threshold Amplitude: 3 V
Lead Channel Pacing Threshold Pulse Width: 0.4 ms
Lead Channel Pacing Threshold Pulse Width: 0.4 ms
Lead Channel Pacing Threshold Pulse Width: 0.4 ms
Lead Channel Sensing Intrinsic Amplitude: 18.25 mV
Lead Channel Sensing Intrinsic Amplitude: 3.75 mV
Lead Channel Sensing Intrinsic Amplitude: 3.75 mV
Lead Channel Sensing Intrinsic Amplitude: 6.625 mV
Lead Channel Setting Pacing Amplitude: 2 V
Lead Channel Setting Pacing Amplitude: 2.5 V
Lead Channel Setting Pacing Pulse Width: 0.4 ms
Lead Channel Setting Pacing Pulse Width: 0.4 ms
Lead Channel Setting Sensing Sensitivity: 4 mV
Zone Setting Status: 755011
Zone Setting Status: 755011

## 2022-10-08 ENCOUNTER — Telehealth: Payer: Self-pay | Admitting: *Deleted

## 2022-10-08 DIAGNOSIS — F419 Anxiety disorder, unspecified: Secondary | ICD-10-CM | POA: Diagnosis not present

## 2022-10-08 DIAGNOSIS — M810 Age-related osteoporosis without current pathological fracture: Secondary | ICD-10-CM | POA: Diagnosis not present

## 2022-10-08 DIAGNOSIS — Z6824 Body mass index (BMI) 24.0-24.9, adult: Secondary | ICD-10-CM | POA: Diagnosis not present

## 2022-10-08 DIAGNOSIS — J309 Allergic rhinitis, unspecified: Secondary | ICD-10-CM | POA: Diagnosis not present

## 2022-10-08 NOTE — Telephone Encounter (Signed)
Advised to arrive at 1:30 pm next Wednesday, 12/20, for generator change. Son verbalized understanding and agreeable to plan.

## 2022-10-14 NOTE — Pre-Procedure Instructions (Addendum)
Spoke to son Nichole Nelson regarding the following items: Arrival time 1330 Nothing to eat or drink after midnight No meds AM of procedure Responsible person to drive you home and stay with you for 24 hrs Wash with special soap night before and morning of procedure If on anti-coagulant drug instructions Eliquis- last dose 12/18

## 2022-10-15 ENCOUNTER — Ambulatory Visit (HOSPITAL_COMMUNITY)
Admission: RE | Admit: 2022-10-15 | Discharge: 2022-10-15 | Disposition: A | Discharge status: Home / Self Care | Payer: Medicare HMO | Attending: Cardiology | Admitting: Cardiology

## 2022-10-15 ENCOUNTER — Other Ambulatory Visit: Payer: Self-pay

## 2022-10-15 ENCOUNTER — Encounter (HOSPITAL_COMMUNITY)
Admission: RE | Disposition: A | Discharge status: Home / Self Care | Payer: Self-pay | Source: Home / Self Care | Attending: Cardiology

## 2022-10-15 DIAGNOSIS — E785 Hyperlipidemia, unspecified: Secondary | ICD-10-CM | POA: Diagnosis not present

## 2022-10-15 DIAGNOSIS — I10 Essential (primary) hypertension: Secondary | ICD-10-CM | POA: Diagnosis not present

## 2022-10-15 DIAGNOSIS — I2510 Atherosclerotic heart disease of native coronary artery without angina pectoris: Secondary | ICD-10-CM | POA: Diagnosis not present

## 2022-10-15 DIAGNOSIS — I779 Disorder of arteries and arterioles, unspecified: Secondary | ICD-10-CM | POA: Insufficient documentation

## 2022-10-15 DIAGNOSIS — I4821 Permanent atrial fibrillation: Secondary | ICD-10-CM | POA: Insufficient documentation

## 2022-10-15 DIAGNOSIS — R001 Bradycardia, unspecified: Secondary | ICD-10-CM | POA: Insufficient documentation

## 2022-10-15 DIAGNOSIS — I442 Atrioventricular block, complete: Secondary | ICD-10-CM

## 2022-10-15 DIAGNOSIS — R6889 Other general symptoms and signs: Secondary | ICD-10-CM | POA: Diagnosis not present

## 2022-10-15 DIAGNOSIS — Z7901 Long term (current) use of anticoagulants: Secondary | ICD-10-CM | POA: Insufficient documentation

## 2022-10-15 DIAGNOSIS — Z45010 Encounter for checking and testing of cardiac pacemaker pulse generator [battery]: Secondary | ICD-10-CM | POA: Insufficient documentation

## 2022-10-15 DIAGNOSIS — Z951 Presence of aortocoronary bypass graft: Secondary | ICD-10-CM | POA: Diagnosis not present

## 2022-10-15 HISTORY — PX: PPM GENERATOR CHANGEOUT: EP1233

## 2022-10-15 LAB — CBC
HCT: 32.1 % — ABNORMAL LOW (ref 36.0–46.0)
Hemoglobin: 9.8 g/dL — ABNORMAL LOW (ref 12.0–15.0)
MCH: 25.9 pg — ABNORMAL LOW (ref 26.0–34.0)
MCHC: 30.5 g/dL (ref 30.0–36.0)
MCV: 84.7 fL (ref 80.0–100.0)
Platelets: 327 10*3/uL (ref 150–400)
RBC: 3.79 MIL/uL — ABNORMAL LOW (ref 3.87–5.11)
RDW: 17.2 % — ABNORMAL HIGH (ref 11.5–15.5)
WBC: 9.4 10*3/uL (ref 4.0–10.5)
nRBC: 0 % (ref 0.0–0.2)

## 2022-10-15 SURGERY — PPM GENERATOR CHANGEOUT

## 2022-10-15 MED ORDER — CEFAZOLIN SODIUM-DEXTROSE 2-4 GM/100ML-% IV SOLN
2.0000 g | INTRAVENOUS | Status: AC
  Administered 2022-10-15: 2 g via INTRAVENOUS

## 2022-10-15 MED ORDER — SODIUM CHLORIDE 0.9 % IV SOLN
80.0000 mg | INTRAVENOUS | Status: AC
  Administered 2022-10-15: 80 mg

## 2022-10-15 MED ORDER — ACETAMINOPHEN 325 MG PO TABS: 325.0000 mg | ORAL_TABLET | ORAL | Status: DC | PRN

## 2022-10-15 MED ORDER — CEFAZOLIN SODIUM-DEXTROSE 2-4 GM/100ML-% IV SOLN
INTRAVENOUS | Status: DC | PRN
  Administered 2022-10-15: 2 g via INTRAVENOUS

## 2022-10-15 MED ORDER — LIDOCAINE HCL (PF) 1 % IJ SOLN
INTRAMUSCULAR | Status: DC | PRN
  Administered 2022-10-15: 60 mL

## 2022-10-15 MED ORDER — CHLORHEXIDINE GLUCONATE 4 % EX LIQD: 4.0000 | Freq: Once | CUTANEOUS | Status: DC

## 2022-10-15 MED ORDER — ONDANSETRON HCL 4 MG/2ML IJ SOLN: 4.0000 mg | Freq: Four times a day (QID) | INTRAMUSCULAR | Status: DC | PRN

## 2022-10-15 MED ORDER — SODIUM CHLORIDE 0.9 % IV SOLN: INTRAVENOUS | Status: DC

## 2022-10-15 MED ORDER — LIDOCAINE HCL (PF) 1 % IJ SOLN
INTRAMUSCULAR | Status: AC
  Filled 2022-10-15: qty 60

## 2022-10-15 SURGICAL SUPPLY — 5 items
CABLE SURGICAL S-101-97-12 (CABLE) ×1 IMPLANT
PACEMAKER PRCT MRI CRTP W1TR01 (Pacemaker) IMPLANT
PAD DEFIB RADIO PHYSIO CONN (PAD) ×1 IMPLANT
PPM PRECEPTA MRI CRT-P W1TR01 (Pacemaker) ×1 IMPLANT
TRAY PACEMAKER INSERTION (PACKS) ×1 IMPLANT

## 2022-10-15 NOTE — Discharge Instructions (Signed)

## 2022-10-15 NOTE — H&P (Signed)
Electrophysiology Office Note   Date:  10/15/2022   ID:  Nichole, Nelson October 16, 1935, MRN [id number]  PCP:  Nichole Johns, MD  Cardiologist:  Nichole Nelson Primary Electrophysiologist:  Nichole Nelson Meredith Leeds, MD    No chief complaint on file.    History of Present Illness: Nichole Nelson is a 87 y.o. female who is being seen today for the evaluation of atrial fibrillation, at the request of No ref. provider found. Presenting today for electrophysiology evaluation.   She has a history significant for permanent atrial fibrillation status post Medtronic pacemaker and AV node ablation, carotid artery disease, hypertension, hyperlipidemia, coronary artery disease status post CABG.    Today, denies symptoms of palpitations, chest pain, shortness of breath, orthopnea, PND, lower extremity edema, claudication, dizziness, presyncope, syncope, bleeding, or neurologic sequela. The patient is tolerating medications without difficulties. Plan pacemaker gen change today.     Past Medical History:  Diagnosis Date   Abdominal fibromatosis    Anemia    Anxiety    ARF (acute renal failure) ([place]) 04/20/2017   Atrial fibrillation ([place]) 03/09/2014   Carotid stenosis 03/09/2014   Overview:  L ICA 60-79% stenosis on 03/03/14   Cerebral infarction ([place]) 03/09/2014   Overview:  R Hemisphere   Chronic respiratory failure (HCC)    COPD (chronic obstructive pulmonary disease) (HCC)    Coronary artery disease involving native coronary artery 04/20/2018   CVA (cerebral vascular accident) ([place])    Diarrhea 02/12/6221   Embolic stroke involving right middle cerebral artery ([place]) 09/04/2015   Essential tremor 03/01/2015   H/O acute pancreatitis    History of myocardial infarction    HTN (hypertension) 04/23/2015   Hx of CABG 04/20/2018   Hyperlipidemia    Hypertension    Hyponatremia 04/09/2017   Hypothyroidism 04/09/2017   Iron deficiency anemia, unspecified 06/16/2019   Pacemaker 04/23/2015   S/P AV  nodal ablation 04/23/2015   Overview:  Done by Dr. Elonda Nelson   Thyroid disease    Past Surgical History:  Procedure Laterality Date   ABDOMINAL HYSTERECTOMY     AORTIC VALVE REPAIR     APPENDECTOMY     CORONARY ARTERY BYPASS GRAFT     ESOPHAGOGASTRODUODENOSCOPY  01/26/2014   Moderate hiatal hernia. presbyesophagus. No evidence of upper GI bleeding.    ESOPHAGOGASTRODUODENOSCOPY  01/26/2014   Moderate hiatal hernia. Presbyesophagus. No evidene of upper Gi bleeding.    HERNIA REPAIR     INSERT / REPLACE / REMOVE PACEMAKER     Medtronic     No current facility-administered medications for this encounter.    Allergies:   Iodinated contrast media; Antihistamines, chlorpheniramine-type; and Meperidine   Social History:  The patient  reports that she has never smoked. She has never used smokeless tobacco. She reports that she does not drink alcohol and does not use drugs.   Family History:  The patient's family history includes Aneurysm in her father; Kidney failure in her mother; Stroke in her mother.   ROS:  Please see the history of present illness.   Otherwise, review of systems is positive for none.   All other systems are reviewed and negative.   PHYSICAL EXAM: VS:  There were no vitals taken for this visit. , BMI There is no height or weight on file to calculate BMI. GEN: Well nourished, well developed, in no acute distress  HEENT: normal  Neck: no JVD, carotid bruits, or masses Cardiac: RRR; no murmurs, rubs, or gallops,no edema  Respiratory:  clear to auscultation bilaterally, normal work of breathing GI: soft, nontender, nondistended, + BS MS: no deformity or atrophy  Skin: warm and dry Neuro:  Strength and sensation are intact Psych: euthymic mood, full affect  Recent Labs: 10/02/2022: BUN 13; Creatinine, Ser 1.06; Hemoglobin 8.9; Platelets 326; Potassium 4.4; Sodium 142    Lipid Panel  No results found for: "CHOL", "TRIG", "HDL", "CHOLHDL", "VLDL", "LDLCALC",  "LDLDIRECT"   Wt Readings from Last 3 Encounters:  01/08/22 56.7 kg  05/01/20 54.4 kg  10/04/19 54.4 kg      Other studies Reviewed: Additional studies/ records that were reviewed today include: TTE 02/26/22 Review of the above records today demonstrates:   1. Mild ASH, no SAm, no gradient. Left ventricular ejection fraction, by  estimation, is 60 to 65%. The left ventricle has normal function. The left  ventricle has no regional wall motion abnormalities. There is mild  asymmetric left ventricular  hypertrophy. Left ventricular diastolic parameters are indeterminate.   2. Right ventricular systolic function is normal. The right ventricular  size is normal. There is normal pulmonary artery systolic pressure.   3. Left atrial size was severely dilated.   4. The mitral valve is normal in structure. No evidence of mitral valve  regurgitation. No evidence of mitral stenosis.   5. The aortic valve is normal in structure. Aortic valve regurgitation is  mild. Aortic valve sclerosis/calcification is present, without any  evidence of aortic stenosis.   6. The inferior vena cava is normal in size with greater than 50%  respiratory variability, suggesting right atrial pressure of 3 mmHg.    ASSESSMENT AND PLAN:  1.  Permanent atrial fibrillation: Currently on Eliquis 5 mg twice daily.  CHA2DS2-VASc of 4.  Was previously on amiodarone but this was stopped.  Mains in atrial fibrillation.  No changes.  2.  Hypertension: Currently well controlled  3.  Status post AV node ablation: [name] has presented today for surgery, with the diagnosis of pacemaker ERI.  The various methods of treatment have been discussed with the patient and family. After consideration of risks, benefits and other options for treatment, the patient has consented to  Procedure(s): Pacemaker implant as a surgical intervention .  Risks include but not limited to bleeding, infection, pneumothorax, perforation,  tamponade, vascular damage, renal failure, MI, stroke, death, and lead dislodgement . The patient's history has been reviewed, patient examined, no change in status, stable for surgery.  I have reviewed the patient's chart and labs.  Questions were answered to the patient's satisfaction.    Tiffanyann Deroo Curt Bears, MD 10/15/2022 1:23 PM

## 2022-10-16 ENCOUNTER — Encounter (HOSPITAL_COMMUNITY): Payer: Self-pay | Admitting: Cardiology

## 2022-10-24 DIAGNOSIS — N3281 Overactive bladder: Secondary | ICD-10-CM | POA: Diagnosis not present

## 2022-10-24 DIAGNOSIS — I639 Cerebral infarction, unspecified: Secondary | ICD-10-CM | POA: Diagnosis not present

## 2022-10-26 NOTE — Progress Notes (Unsigned)
Cardiology Office Note Date:  10/26/2022  Patient ID:  Nichole Nelson, Nichole Nelson May 16, 1935, MRN [id number] PCP:  Nicholos Johns, MD  Cardiologist:  Dr. Agustin Cree Electrophysiologist: Dr. Curt Bears  ***refresh   Chief Complaint: ***  History of Present Illness: Nichole Nelson is a 87 y.o. female with history of CAD (CABG), HTN, HLD, PVD (Carotid ***), permanent AFib s/p AV node ablation w/PPM, stroke, essential tremor, .  She underwent PPM gen change 10/15/22  *** site *** eliquis, bleeding, dose, labs  Device information MDT CRT-P implanted 07/12/2014, gen change 10/15/22 She has [phone] [id number] lead in the RV Appears by CXR to have HIS and septal positioned leads   Programmed VVIR LV > RV  Past Medical History:  Diagnosis Date   Abdominal fibromatosis    Anemia    Anxiety    ARF (acute renal failure) ([place]) 04/20/2017   Atrial fibrillation ([place]) 03/09/2014   Carotid stenosis 03/09/2014   Overview:  L ICA 60-79% stenosis on 03/03/14   Cerebral infarction ([place]) 03/09/2014   Overview:  R Hemisphere   Chronic respiratory failure (HCC)    COPD (chronic obstructive pulmonary disease) ([place])    Coronary artery disease involving native coronary artery 04/20/2018   CVA (cerebral vascular accident) ([place])    Diarrhea 0/86/5784   Embolic stroke involving right middle cerebral artery ([place]) 09/04/2015   Essential tremor 03/01/2015   H/O acute pancreatitis    History of myocardial infarction    HTN (hypertension) 04/23/2015   Hx of CABG 04/20/2018   Hyperlipidemia    Hypertension    Hyponatremia 04/09/2017   Hypothyroidism 04/09/2017   Iron deficiency anemia, unspecified 06/16/2019   Pacemaker 04/23/2015   S/P AV nodal ablation 04/23/2015   Overview:  Done by Dr. Elonda Husky   Thyroid disease     Past Surgical History:  Procedure Laterality Date   ABDOMINAL HYSTERECTOMY     AORTIC VALVE REPAIR     APPENDECTOMY     CORONARY ARTERY BYPASS GRAFT     ESOPHAGOGASTRODUODENOSCOPY  01/26/2014    Moderate hiatal hernia. presbyesophagus. No evidence of upper GI bleeding.    ESOPHAGOGASTRODUODENOSCOPY  01/26/2014   Moderate hiatal hernia. Presbyesophagus. No evidene of upper Gi bleeding.    HERNIA REPAIR     INSERT / REPLACE / REMOVE PACEMAKER     Medtronic   PPM GENERATOR CHANGEOUT N/A 10/15/2022   Procedure: PPM GENERATOR CHANGEOUT;  Surgeon: Constance Haw, MD;  Location: [facility];  Service: Cardiovascular;  Laterality: N/A;    Current Outpatient Medications  Medication Sig Dispense Refill   albuterol (VENTOLIN HFA) 108 (90 Base) MCG/ACT inhaler Inhale 2 puffs into the lungs every 4 (four) hours as needed for wheezing or shortness of breath.     apixaban (ELIQUIS) 2.5 MG TABS tablet Take 1 tablet (2.5 mg total) by mouth 2 (two) times daily. 60 tablet 5   b complex vitamins capsule Take 1 capsule by mouth daily.     Cholecalciferol (VITAMIN D3) 25 MCG (1000 UT) CAPS Take 1,000 Units by mouth daily.     clonazePAM (KLONOPIN) 0.5 MG tablet Take 0.5-1 mg by mouth See admin instructions. Take 0.5 mg in the morning and 1 mg at bedtime     DEXILANT 60 MG capsule Take 60 mg by mouth daily.     diltiazem (CARDIZEM CD) 120 MG 24 hr capsule Take 120 mg by mouth daily.     escitalopram (LEXAPRO) 10 MG tablet Take 10 mg by  mouth daily.     fluticasone furoate-vilanterol (BREO ELLIPTA) 100-25 MCG/INH AEPB Inhale 1 puff into the lungs at bedtime.     gabapentin (NEURONTIN) 100 MG capsule Take 100 mg by mouth 4 (four) times daily.     levothyroxine (SYNTHROID) 100 MCG tablet Take 100 mcg by mouth daily.     losartan (COZAAR) 50 MG tablet Take 1.5 tablets by mouth daily.     mometasone (NASONEX) 50 MCG/ACT nasal spray Place 1 spray into both nostrils daily.     montelukast (SINGULAIR) 10 MG tablet Take 10 mg by mouth daily.     ondansetron (ZOFRAN) 4 MG tablet Take 4 mg by mouth every 8 (eight) hours as needed for nausea/vomiting, vomiting or nausea.     Phenazopyridine HCl (AZO  DINE PO) Take 2 capsules by mouth daily. Cranberry     pravastatin (PRAVACHOL) 40 MG tablet Take 40 mg by mouth daily.     Probiotic Product (PROBIOTIC PO) Take 1 capsule by mouth daily.     Vitamin D, Ergocalciferol, (DRISDOL) 1.25 MG (50000 UNIT) CAPS capsule Take 50,000 Units by mouth every 7 (seven) days.     No current facility-administered medications for this visit.    Allergies:   Iodinated contrast media; Antihistamines, chlorpheniramine-type; and Meperidine   Social History:  The patient  reports that she has never smoked. She has never used smokeless tobacco. She reports that she does not drink alcohol and does not use drugs.   Family History:  The patient's family history includes Aneurysm in her father; Kidney failure in her mother; Stroke in her mother.  ROS:  Please see the history of present illness.    All other systems are reviewed and otherwise negative.   PHYSICAL EXAM:  VS:  There were no vitals taken for this visit. BMI: There is no height or weight on file to calculate BMI. Well nourished, well developed, in no acute distress HEENT: normocephalic, atraumatic Neck: no JVD, carotid bruits or masses Cardiac:  *** RRR; no significant murmurs, no rubs, or gallops Lungs:  *** CTA b/l, no wheezing, rhonchi or rales Abd: soft, nontender MS: no deformity or *** atrophy Ext: *** no edema Skin: warm and dry, no rash Neuro:  No gross deficits appreciated Psych: euthymic mood, full affect  *** PPM site is stable, no tethering or discomfort   EKG:  not done today  Device interrogation done today and reviewed by myself  ***  TTE 02/26/22  1. Mild ASH, no SAm, no gradient. Left ventricular ejection fraction, by  estimation, is 60 to 65%. The left ventricle has normal function. The left  ventricle has no regional wall motion abnormalities. There is mild  asymmetric left ventricular  hypertrophy. Left ventricular diastolic parameters are indeterminate.   2. Right  ventricular systolic function is normal. The right ventricular  size is normal. There is normal pulmonary artery systolic pressure.   3. Left atrial size was severely dilated.   4. The mitral valve is normal in structure. No evidence of mitral valve  regurgitation. No evidence of mitral stenosis.   5. The aortic valve is normal in structure. Aortic valve regurgitation is  mild. Aortic valve sclerosis/calcification is present, without any  evidence of aortic stenosis.   6. The inferior vena cava is normal in size with greater than 50%  respiratory variability, suggesting right atrial pressure of 3 mmHg.      Recent Labs: 10/02/2022: BUN 13; Creatinine, Ser 1.06; Potassium 4.4; Sodium 142 10/15/2022: Hemoglobin  9.8; Platelets 327  No results found for requested labs within last 365 days.   CrCl cannot be calculated (Patient's most recent lab result is older than the maximum 21 days allowed.).   Wt Readings from Last 3 Encounters:  10/15/22 120 lb (54.4 kg)  01/08/22 125 lb (56.7 kg)  05/01/20 120 lb (54.4 kg)     Other studies reviewed: Additional studies/records reviewed today include: summarized above  ASSESSMENT AND PLAN:  CRT-P ***  Permanent AFib CHA2DS2Vasc is 8, on Eliquis, *** appropriately dosed S/p AV node ablation  CAD ***  HTN ***  HLD ***   Disposition: F/u with ***  Current medicines are reviewed at length with the patient today.  The patient did not have any concerns regarding medicines.  Venetia Night, PA-C 10/26/2022 5:17 PM     [place] [phone] (office)  [phone] (fax)

## 2022-10-29 ENCOUNTER — Ambulatory Visit: Payer: Medicare HMO

## 2022-10-30 ENCOUNTER — Ambulatory Visit: Payer: Medicare HMO | Attending: Physician Assistant | Admitting: Physician Assistant

## 2022-10-30 ENCOUNTER — Encounter: Payer: Self-pay | Admitting: Physician Assistant

## 2022-10-30 VITALS — BP 140/60 | HR 70 | Ht 60.0 in | Wt 125.0 lb

## 2022-10-30 DIAGNOSIS — Z5189 Encounter for other specified aftercare: Secondary | ICD-10-CM | POA: Diagnosis not present

## 2022-10-30 DIAGNOSIS — I4821 Permanent atrial fibrillation: Secondary | ICD-10-CM | POA: Diagnosis not present

## 2022-10-30 DIAGNOSIS — I2510 Atherosclerotic heart disease of native coronary artery without angina pectoris: Secondary | ICD-10-CM | POA: Diagnosis not present

## 2022-10-30 DIAGNOSIS — R6889 Other general symptoms and signs: Secondary | ICD-10-CM | POA: Diagnosis not present

## 2022-10-30 DIAGNOSIS — I10 Essential (primary) hypertension: Secondary | ICD-10-CM | POA: Diagnosis not present

## 2022-10-30 DIAGNOSIS — Z950 Presence of cardiac pacemaker: Secondary | ICD-10-CM | POA: Diagnosis not present

## 2022-10-30 LAB — CUP PACEART INCLINIC DEVICE CHECK
Battery Remaining Longevity: 112 mo
Battery Voltage: 3.2 V
Brady Statistic AP VP Percent: 0 %
Brady Statistic AP VS Percent: 0 %
Brady Statistic AS VP Percent: 99.6 %
Brady Statistic AS VS Percent: 0.4 %
Brady Statistic RA Percent Paced: 0 %
Brady Statistic RV Percent Paced: 99.6 %
Date Time Interrogation Session: 20240104162234
Implantable Lead Connection Status: 753985
Implantable Lead Connection Status: 753985
Implantable Lead Connection Status: 753985
Implantable Lead Implant Date: 20150916
Implantable Lead Implant Date: 20150916
Implantable Lead Implant Date: 20150916
Implantable Lead Location: 753859
Implantable Lead Location: 753860
Implantable Lead Location: 753860
Implantable Lead Model: 5076
Implantable Lead Model: 5076
Implantable Lead Model: 5076
Implantable Pulse Generator Implant Date: 20231220
Lead Channel Impedance Value: 190 Ohm
Lead Channel Impedance Value: 285 Ohm
Lead Channel Impedance Value: 342 Ohm
Lead Channel Impedance Value: 437 Ohm
Lead Channel Impedance Value: 475 Ohm
Lead Channel Impedance Value: 570 Ohm
Lead Channel Impedance Value: 589 Ohm
Lead Channel Impedance Value: 665 Ohm
Lead Channel Impedance Value: 722 Ohm
Lead Channel Pacing Threshold Amplitude: 0.75 V
Lead Channel Pacing Threshold Amplitude: 1.25 V
Lead Channel Pacing Threshold Pulse Width: 0.4 ms
Lead Channel Pacing Threshold Pulse Width: 0.4 ms
Lead Channel Setting Pacing Amplitude: 2.75 V
Lead Channel Setting Pacing Amplitude: 2.75 V
Lead Channel Setting Pacing Pulse Width: 0.4 ms
Lead Channel Setting Pacing Pulse Width: 0.4 ms
Lead Channel Setting Sensing Sensitivity: 4 mV
Zone Setting Status: 755011

## 2022-10-30 NOTE — Patient Instructions (Signed)
Medication Instructions:   Your physician recommends that you continue on your current medications as directed. Please refer to the Current Medication list given to you today.  *If you need a refill on your cardiac medications before your next appointment, please call your pharmacy*   Lab Work: [place]   If you have labs (blood work) drawn today and your tests are completely normal, you will receive your results only by: [place] (if you have MyChart) OR A paper copy in the mail If you have any lab test that is abnormal or we need to change your treatment, we will call you to review the results.   Testing/Procedures: NONE ORDERED  TODAY     Follow-Up: At [facility], you and your health needs are our priority.  As part of our continuing mission to provide you with exceptional heart care, we have created designated Provider Care Teams.  These Care Teams include your primary Cardiologist (physician) and Advanced Practice Providers (APPs -  Physician Assistants and Nurse Practitioners) who all work together to provide you with the care you need, when you need it.  We recommend signing up for the patient portal called "MyChart".  Sign up information is provided on this After Visit Summary.  MyChart is used to connect with patients for Virtual Visits (Telemedicine).  Patients are able to view lab/test results, encounter notes, upcoming appointments, etc.  Non-urgent messages can be sent to your provider as well.   To learn more about what you can do with MyChart, go to [email or web address].    Your next appointment:   AS SCHEDULED   The format for your next appointment:   In Person  Provider:   You may see [name], MD or one of the following Advanced Practice Providers on your designated Care Team:   Tommye Standard, [place] Legrand Como "Jonni Sanger" Chalmers Cater, [place]   Other Instructions   Important Information About Sugar

## 2022-11-07 DIAGNOSIS — F330 Major depressive disorder, recurrent, mild: Secondary | ICD-10-CM | POA: Diagnosis not present

## 2022-11-07 DIAGNOSIS — J449 Chronic obstructive pulmonary disease, unspecified: Secondary | ICD-10-CM | POA: Diagnosis not present

## 2022-11-07 DIAGNOSIS — F419 Anxiety disorder, unspecified: Secondary | ICD-10-CM | POA: Diagnosis not present

## 2022-11-07 DIAGNOSIS — M7989 Other specified soft tissue disorders: Secondary | ICD-10-CM | POA: Diagnosis not present

## 2022-11-07 DIAGNOSIS — Z8679 Personal history of other diseases of the circulatory system: Secondary | ICD-10-CM | POA: Diagnosis not present

## 2022-11-13 NOTE — Progress Notes (Signed)
Remote pacemaker transmission.   

## 2022-11-20 DIAGNOSIS — J019 Acute sinusitis, unspecified: Secondary | ICD-10-CM | POA: Diagnosis not present

## 2022-11-21 DIAGNOSIS — I639 Cerebral infarction, unspecified: Secondary | ICD-10-CM | POA: Diagnosis not present

## 2022-11-21 DIAGNOSIS — N3281 Overactive bladder: Secondary | ICD-10-CM | POA: Diagnosis not present

## 2022-12-05 DIAGNOSIS — I2510 Atherosclerotic heart disease of native coronary artery without angina pectoris: Secondary | ICD-10-CM | POA: Diagnosis not present

## 2022-12-05 DIAGNOSIS — I10 Essential (primary) hypertension: Secondary | ICD-10-CM | POA: Diagnosis not present

## 2022-12-05 DIAGNOSIS — R6889 Other general symptoms and signs: Secondary | ICD-10-CM | POA: Diagnosis not present

## 2022-12-05 DIAGNOSIS — J449 Chronic obstructive pulmonary disease, unspecified: Secondary | ICD-10-CM | POA: Diagnosis not present

## 2022-12-08 DIAGNOSIS — G2581 Restless legs syndrome: Secondary | ICD-10-CM | POA: Diagnosis not present

## 2022-12-08 DIAGNOSIS — M545 Low back pain, unspecified: Secondary | ICD-10-CM | POA: Diagnosis not present

## 2022-12-08 DIAGNOSIS — I10 Essential (primary) hypertension: Secondary | ICD-10-CM | POA: Diagnosis not present

## 2022-12-08 DIAGNOSIS — K219 Gastro-esophageal reflux disease without esophagitis: Secondary | ICD-10-CM | POA: Diagnosis not present

## 2022-12-08 DIAGNOSIS — Z6824 Body mass index (BMI) 24.0-24.9, adult: Secondary | ICD-10-CM | POA: Diagnosis not present

## 2022-12-08 DIAGNOSIS — I714 Abdominal aortic aneurysm, without rupture, unspecified: Secondary | ICD-10-CM | POA: Diagnosis not present

## 2022-12-23 DIAGNOSIS — I639 Cerebral infarction, unspecified: Secondary | ICD-10-CM | POA: Diagnosis not present

## 2022-12-23 DIAGNOSIS — N3281 Overactive bladder: Secondary | ICD-10-CM | POA: Diagnosis not present

## 2023-01-07 DIAGNOSIS — J449 Chronic obstructive pulmonary disease, unspecified: Secondary | ICD-10-CM | POA: Diagnosis not present

## 2023-01-07 DIAGNOSIS — J309 Allergic rhinitis, unspecified: Secondary | ICD-10-CM | POA: Diagnosis not present

## 2023-01-15 ENCOUNTER — Ambulatory Visit (INDEPENDENT_AMBULATORY_CARE_PROVIDER_SITE_OTHER): Payer: Medicare HMO

## 2023-01-15 DIAGNOSIS — I442 Atrioventricular block, complete: Secondary | ICD-10-CM

## 2023-01-15 LAB — CUP PACEART REMOTE DEVICE CHECK
Battery Remaining Longevity: 97 mo
Battery Voltage: 3.15 V
Brady Statistic AP VP Percent: 0 %
Brady Statistic AP VS Percent: 0 %
Brady Statistic AS VP Percent: 99.84 %
Brady Statistic AS VS Percent: 0.16 %
Brady Statistic RA Percent Paced: 0 %
Brady Statistic RV Percent Paced: 99.84 %
Date Time Interrogation Session: 20240320215135
Implantable Lead Connection Status: 753985
Implantable Lead Connection Status: 753985
Implantable Lead Connection Status: 753985
Implantable Lead Implant Date: 20150916
Implantable Lead Implant Date: 20150916
Implantable Lead Implant Date: 20150916
Implantable Lead Location: 753859
Implantable Lead Location: 753860
Implantable Lead Location: 753860
Implantable Lead Model: 5076
Implantable Lead Model: 5076
Implantable Lead Model: 5076
Implantable Pulse Generator Implant Date: 20231220
Lead Channel Impedance Value: 190 Ohm
Lead Channel Impedance Value: 285 Ohm
Lead Channel Impedance Value: 342 Ohm
Lead Channel Impedance Value: 399 Ohm
Lead Channel Impedance Value: 437 Ohm
Lead Channel Impedance Value: 475 Ohm
Lead Channel Impedance Value: 494 Ohm
Lead Channel Impedance Value: 532 Ohm
Lead Channel Impedance Value: 608 Ohm
Lead Channel Pacing Threshold Amplitude: 1.25 V
Lead Channel Pacing Threshold Amplitude: 1.375 V
Lead Channel Pacing Threshold Pulse Width: 0.4 ms
Lead Channel Pacing Threshold Pulse Width: 0.4 ms
Lead Channel Sensing Intrinsic Amplitude: 6.625 mV
Lead Channel Sensing Intrinsic Amplitude: 6.625 mV
Lead Channel Setting Pacing Amplitude: 2.75 V
Lead Channel Setting Pacing Amplitude: 3.25 V
Lead Channel Setting Pacing Pulse Width: 0.4 ms
Lead Channel Setting Pacing Pulse Width: 0.4 ms
Lead Channel Setting Sensing Sensitivity: 4 mV
Zone Setting Status: 755011

## 2023-01-21 DIAGNOSIS — N3281 Overactive bladder: Secondary | ICD-10-CM | POA: Diagnosis not present

## 2023-01-21 DIAGNOSIS — I639 Cerebral infarction, unspecified: Secondary | ICD-10-CM | POA: Diagnosis not present

## 2023-01-26 ENCOUNTER — Encounter: Payer: Medicare HMO | Admitting: Cardiology

## 2023-02-05 DIAGNOSIS — M7989 Other specified soft tissue disorders: Secondary | ICD-10-CM | POA: Diagnosis not present

## 2023-02-05 DIAGNOSIS — Z8679 Personal history of other diseases of the circulatory system: Secondary | ICD-10-CM | POA: Diagnosis not present

## 2023-02-05 DIAGNOSIS — F419 Anxiety disorder, unspecified: Secondary | ICD-10-CM | POA: Diagnosis not present

## 2023-02-05 DIAGNOSIS — F330 Major depressive disorder, recurrent, mild: Secondary | ICD-10-CM | POA: Diagnosis not present

## 2023-02-05 DIAGNOSIS — J449 Chronic obstructive pulmonary disease, unspecified: Secondary | ICD-10-CM | POA: Diagnosis not present

## 2023-02-13 ENCOUNTER — Encounter: Payer: Medicare HMO | Admitting: Cardiology

## 2023-02-18 NOTE — Progress Notes (Signed)
Remote pacemaker transmission.   

## 2023-02-23 DIAGNOSIS — I639 Cerebral infarction, unspecified: Secondary | ICD-10-CM | POA: Diagnosis not present

## 2023-02-23 DIAGNOSIS — N3281 Overactive bladder: Secondary | ICD-10-CM | POA: Diagnosis not present

## 2023-02-25 DIAGNOSIS — E559 Vitamin D deficiency, unspecified: Secondary | ICD-10-CM | POA: Diagnosis not present

## 2023-02-25 DIAGNOSIS — M7989 Other specified soft tissue disorders: Secondary | ICD-10-CM | POA: Diagnosis not present

## 2023-02-25 DIAGNOSIS — D509 Iron deficiency anemia, unspecified: Secondary | ICD-10-CM | POA: Diagnosis not present

## 2023-02-25 DIAGNOSIS — E785 Hyperlipidemia, unspecified: Secondary | ICD-10-CM | POA: Diagnosis not present

## 2023-03-04 DIAGNOSIS — M7989 Other specified soft tissue disorders: Secondary | ICD-10-CM | POA: Diagnosis not present

## 2023-03-04 DIAGNOSIS — E559 Vitamin D deficiency, unspecified: Secondary | ICD-10-CM | POA: Diagnosis not present

## 2023-03-04 DIAGNOSIS — E785 Hyperlipidemia, unspecified: Secondary | ICD-10-CM | POA: Diagnosis not present

## 2023-03-04 DIAGNOSIS — R6889 Other general symptoms and signs: Secondary | ICD-10-CM | POA: Diagnosis not present

## 2023-03-04 DIAGNOSIS — Z79899 Other long term (current) drug therapy: Secondary | ICD-10-CM | POA: Diagnosis not present

## 2023-03-04 DIAGNOSIS — R600 Localized edema: Secondary | ICD-10-CM | POA: Diagnosis not present

## 2023-03-04 DIAGNOSIS — D509 Iron deficiency anemia, unspecified: Secondary | ICD-10-CM | POA: Diagnosis not present

## 2023-03-19 DIAGNOSIS — K5909 Other constipation: Secondary | ICD-10-CM | POA: Diagnosis not present

## 2023-03-19 DIAGNOSIS — R6889 Other general symptoms and signs: Secondary | ICD-10-CM | POA: Diagnosis not present

## 2023-03-19 DIAGNOSIS — M7989 Other specified soft tissue disorders: Secondary | ICD-10-CM | POA: Diagnosis not present

## 2023-03-25 DIAGNOSIS — I639 Cerebral infarction, unspecified: Secondary | ICD-10-CM | POA: Diagnosis not present

## 2023-03-25 DIAGNOSIS — N3281 Overactive bladder: Secondary | ICD-10-CM | POA: Diagnosis not present

## 2023-03-27 ENCOUNTER — Ambulatory Visit: Payer: Medicare HMO | Admitting: Cardiology

## 2023-03-29 ENCOUNTER — Other Ambulatory Visit: Payer: Self-pay | Admitting: Cardiology

## 2023-03-29 DIAGNOSIS — I4821 Permanent atrial fibrillation: Secondary | ICD-10-CM

## 2023-03-30 NOTE — Telephone Encounter (Signed)
Prescription refill request for Eliquis received. Indication:afib Last office visit:1/24 Scr:1.06  12/23 Age: 88 Weight:56.7  kg  Prescription refilled

## 2023-04-06 DIAGNOSIS — G2581 Restless legs syndrome: Secondary | ICD-10-CM | POA: Diagnosis not present

## 2023-04-06 DIAGNOSIS — G250 Essential tremor: Secondary | ICD-10-CM | POA: Diagnosis not present

## 2023-04-06 DIAGNOSIS — I10 Essential (primary) hypertension: Secondary | ICD-10-CM | POA: Diagnosis not present

## 2023-04-06 DIAGNOSIS — Z6824 Body mass index (BMI) 24.0-24.9, adult: Secondary | ICD-10-CM | POA: Diagnosis not present

## 2023-04-06 DIAGNOSIS — K219 Gastro-esophageal reflux disease without esophagitis: Secondary | ICD-10-CM | POA: Diagnosis not present

## 2023-04-16 ENCOUNTER — Ambulatory Visit (INDEPENDENT_AMBULATORY_CARE_PROVIDER_SITE_OTHER): Payer: Medicare HMO

## 2023-04-16 DIAGNOSIS — I442 Atrioventricular block, complete: Secondary | ICD-10-CM

## 2023-04-16 LAB — CUP PACEART REMOTE DEVICE CHECK
Battery Remaining Longevity: 84 mo
Battery Voltage: 3.04 V
Brady Statistic AP VP Percent: 0 %
Brady Statistic AP VS Percent: 0 %
Brady Statistic AS VP Percent: 99.95 %
Brady Statistic AS VS Percent: 0.05 %
Brady Statistic RA Percent Paced: 0 %
Brady Statistic RV Percent Paced: 99.95 %
Date Time Interrogation Session: 20240620013025
Implantable Lead Connection Status: 753985
Implantable Lead Connection Status: 753985
Implantable Lead Connection Status: 753985
Implantable Lead Implant Date: 20150916
Implantable Lead Implant Date: 20150916
Implantable Lead Implant Date: 20150916
Implantable Lead Location: 753859
Implantable Lead Location: 753860
Implantable Lead Location: 753860
Implantable Lead Model: 5076
Implantable Lead Model: 5076
Implantable Lead Model: 5076
Implantable Pulse Generator Implant Date: 20231220
Lead Channel Impedance Value: 209 Ohm
Lead Channel Impedance Value: 285 Ohm
Lead Channel Impedance Value: 380 Ohm
Lead Channel Impedance Value: 399 Ohm
Lead Channel Impedance Value: 437 Ohm
Lead Channel Impedance Value: 475 Ohm
Lead Channel Impedance Value: 494 Ohm
Lead Channel Impedance Value: 570 Ohm
Lead Channel Impedance Value: 646 Ohm
Lead Channel Pacing Threshold Amplitude: 1.375 V
Lead Channel Pacing Threshold Amplitude: 1.75 V
Lead Channel Pacing Threshold Pulse Width: 0.4 ms
Lead Channel Pacing Threshold Pulse Width: 0.4 ms
Lead Channel Sensing Intrinsic Amplitude: 6.625 mV
Lead Channel Sensing Intrinsic Amplitude: 6.625 mV
Lead Channel Setting Pacing Amplitude: 3 V
Lead Channel Setting Pacing Amplitude: 3.75 V
Lead Channel Setting Pacing Pulse Width: 0.4 ms
Lead Channel Setting Pacing Pulse Width: 0.4 ms
Lead Channel Setting Sensing Sensitivity: 4 mV
Zone Setting Status: 755011

## 2023-04-23 DIAGNOSIS — N3281 Overactive bladder: Secondary | ICD-10-CM | POA: Diagnosis not present

## 2023-04-23 DIAGNOSIS — I639 Cerebral infarction, unspecified: Secondary | ICD-10-CM | POA: Diagnosis not present

## 2023-05-06 DIAGNOSIS — F330 Major depressive disorder, recurrent, mild: Secondary | ICD-10-CM | POA: Diagnosis not present

## 2023-05-06 DIAGNOSIS — J309 Allergic rhinitis, unspecified: Secondary | ICD-10-CM | POA: Diagnosis not present

## 2023-05-06 DIAGNOSIS — M810 Age-related osteoporosis without current pathological fracture: Secondary | ICD-10-CM | POA: Diagnosis not present

## 2023-05-06 DIAGNOSIS — Z6824 Body mass index (BMI) 24.0-24.9, adult: Secondary | ICD-10-CM | POA: Diagnosis not present

## 2023-05-06 NOTE — Progress Notes (Signed)
Remote pacemaker transmission.   

## 2023-05-22 DIAGNOSIS — I639 Cerebral infarction, unspecified: Secondary | ICD-10-CM | POA: Diagnosis not present

## 2023-05-22 DIAGNOSIS — N3281 Overactive bladder: Secondary | ICD-10-CM | POA: Diagnosis not present

## 2023-06-03 DIAGNOSIS — F330 Major depressive disorder, recurrent, mild: Secondary | ICD-10-CM | POA: Diagnosis not present

## 2023-06-03 DIAGNOSIS — F419 Anxiety disorder, unspecified: Secondary | ICD-10-CM | POA: Diagnosis not present

## 2023-06-03 DIAGNOSIS — M7989 Other specified soft tissue disorders: Secondary | ICD-10-CM | POA: Diagnosis not present

## 2023-06-03 DIAGNOSIS — E559 Vitamin D deficiency, unspecified: Secondary | ICD-10-CM | POA: Diagnosis not present

## 2023-06-11 DIAGNOSIS — Z9181 History of falling: Secondary | ICD-10-CM | POA: Diagnosis not present

## 2023-06-11 DIAGNOSIS — Z0000 Encounter for general adult medical examination without abnormal findings: Secondary | ICD-10-CM | POA: Diagnosis not present

## 2023-06-22 DIAGNOSIS — J449 Chronic obstructive pulmonary disease, unspecified: Secondary | ICD-10-CM | POA: Diagnosis not present

## 2023-06-22 DIAGNOSIS — R6889 Other general symptoms and signs: Secondary | ICD-10-CM | POA: Diagnosis not present

## 2023-06-26 DIAGNOSIS — I639 Cerebral infarction, unspecified: Secondary | ICD-10-CM | POA: Diagnosis not present

## 2023-06-26 DIAGNOSIS — N3281 Overactive bladder: Secondary | ICD-10-CM | POA: Diagnosis not present

## 2023-07-01 DIAGNOSIS — G2581 Restless legs syndrome: Secondary | ICD-10-CM | POA: Diagnosis not present

## 2023-07-01 DIAGNOSIS — I10 Essential (primary) hypertension: Secondary | ICD-10-CM | POA: Diagnosis not present

## 2023-07-01 DIAGNOSIS — G250 Essential tremor: Secondary | ICD-10-CM | POA: Diagnosis not present

## 2023-07-01 DIAGNOSIS — Z6824 Body mass index (BMI) 24.0-24.9, adult: Secondary | ICD-10-CM | POA: Diagnosis not present

## 2023-07-01 DIAGNOSIS — K219 Gastro-esophageal reflux disease without esophagitis: Secondary | ICD-10-CM | POA: Diagnosis not present

## 2023-07-16 ENCOUNTER — Ambulatory Visit (INDEPENDENT_AMBULATORY_CARE_PROVIDER_SITE_OTHER): Payer: Medicare HMO

## 2023-07-16 DIAGNOSIS — I442 Atrioventricular block, complete: Secondary | ICD-10-CM

## 2023-07-20 LAB — CUP PACEART REMOTE DEVICE CHECK
Battery Remaining Longevity: 86 mo
Battery Voltage: 3.01 V
Brady Statistic AP VP Percent: 0 %
Brady Statistic AP VS Percent: 0 %
Brady Statistic AS VP Percent: 99.8 %
Brady Statistic AS VS Percent: 0.2 %
Brady Statistic RA Percent Paced: 0 %
Brady Statistic RV Percent Paced: 99.8 %
Date Time Interrogation Session: 20240920130927
Implantable Lead Connection Status: 753985
Implantable Lead Connection Status: 753985
Implantable Lead Connection Status: 753985
Implantable Lead Implant Date: 20150916
Implantable Lead Implant Date: 20150916
Implantable Lead Implant Date: 20150916
Implantable Lead Location: 753859
Implantable Lead Location: 753860
Implantable Lead Location: 753860
Implantable Lead Model: 5076
Implantable Lead Model: 5076
Implantable Lead Model: 5076
Implantable Pulse Generator Implant Date: 20231220
Lead Channel Impedance Value: 209 Ohm
Lead Channel Impedance Value: 285 Ohm
Lead Channel Impedance Value: 418 Ohm
Lead Channel Impedance Value: 418 Ohm
Lead Channel Impedance Value: 475 Ohm
Lead Channel Impedance Value: 475 Ohm
Lead Channel Impedance Value: 532 Ohm
Lead Channel Impedance Value: 608 Ohm
Lead Channel Impedance Value: 684 Ohm
Lead Channel Pacing Threshold Amplitude: 1.375 V
Lead Channel Pacing Threshold Amplitude: 1.75 V
Lead Channel Pacing Threshold Pulse Width: 0.4 ms
Lead Channel Pacing Threshold Pulse Width: 0.4 ms
Lead Channel Sensing Intrinsic Amplitude: 6.625 mV
Lead Channel Sensing Intrinsic Amplitude: 6.625 mV
Lead Channel Setting Pacing Amplitude: 2.75 V
Lead Channel Setting Pacing Amplitude: 3.75 V
Lead Channel Setting Pacing Pulse Width: 0.4 ms
Lead Channel Setting Pacing Pulse Width: 0.4 ms
Lead Channel Setting Sensing Sensitivity: 4 mV
Zone Setting Status: 755011

## 2023-07-24 DIAGNOSIS — I639 Cerebral infarction, unspecified: Secondary | ICD-10-CM | POA: Diagnosis not present

## 2023-07-24 DIAGNOSIS — N3281 Overactive bladder: Secondary | ICD-10-CM | POA: Diagnosis not present

## 2023-07-28 NOTE — Progress Notes (Signed)
Remote pacemaker transmission.   

## 2023-07-29 DIAGNOSIS — Z6824 Body mass index (BMI) 24.0-24.9, adult: Secondary | ICD-10-CM | POA: Diagnosis not present

## 2023-07-29 DIAGNOSIS — N390 Urinary tract infection, site not specified: Secondary | ICD-10-CM | POA: Diagnosis not present

## 2023-08-03 DIAGNOSIS — F330 Major depressive disorder, recurrent, mild: Secondary | ICD-10-CM | POA: Diagnosis not present

## 2023-08-03 DIAGNOSIS — N390 Urinary tract infection, site not specified: Secondary | ICD-10-CM | POA: Diagnosis not present

## 2023-08-03 DIAGNOSIS — J309 Allergic rhinitis, unspecified: Secondary | ICD-10-CM | POA: Diagnosis not present

## 2023-08-05 DIAGNOSIS — N390 Urinary tract infection, site not specified: Secondary | ICD-10-CM | POA: Diagnosis not present

## 2023-08-10 ENCOUNTER — Encounter: Payer: Self-pay | Admitting: General Practice

## 2023-08-25 DIAGNOSIS — I639 Cerebral infarction, unspecified: Secondary | ICD-10-CM | POA: Diagnosis not present

## 2023-08-25 DIAGNOSIS — N3281 Overactive bladder: Secondary | ICD-10-CM | POA: Diagnosis not present

## 2023-09-02 DIAGNOSIS — F330 Major depressive disorder, recurrent, mild: Secondary | ICD-10-CM | POA: Diagnosis not present

## 2023-09-02 DIAGNOSIS — J019 Acute sinusitis, unspecified: Secondary | ICD-10-CM | POA: Diagnosis not present

## 2023-09-02 DIAGNOSIS — F419 Anxiety disorder, unspecified: Secondary | ICD-10-CM | POA: Diagnosis not present

## 2023-09-02 DIAGNOSIS — E559 Vitamin D deficiency, unspecified: Secondary | ICD-10-CM | POA: Diagnosis not present

## 2023-09-02 DIAGNOSIS — D509 Iron deficiency anemia, unspecified: Secondary | ICD-10-CM | POA: Diagnosis not present

## 2023-09-02 DIAGNOSIS — E785 Hyperlipidemia, unspecified: Secondary | ICD-10-CM | POA: Diagnosis not present

## 2023-09-15 ENCOUNTER — Other Ambulatory Visit: Payer: Self-pay | Admitting: Cardiology

## 2023-09-15 DIAGNOSIS — I4821 Permanent atrial fibrillation: Secondary | ICD-10-CM

## 2023-09-15 NOTE — Telephone Encounter (Signed)
Prescription refill request for Eliquis received. Indication:AFIB Last office visit:1/24 Scr:1.06  12/23 Age: 88 Weight:56.7  KG  PRESCRIPTION REFILLED

## 2023-09-22 DIAGNOSIS — R6889 Other general symptoms and signs: Secondary | ICD-10-CM | POA: Diagnosis not present

## 2023-09-22 DIAGNOSIS — J449 Chronic obstructive pulmonary disease, unspecified: Secondary | ICD-10-CM | POA: Diagnosis not present

## 2023-09-22 DIAGNOSIS — I639 Cerebral infarction, unspecified: Secondary | ICD-10-CM | POA: Diagnosis not present

## 2023-09-22 DIAGNOSIS — Z23 Encounter for immunization: Secondary | ICD-10-CM | POA: Diagnosis not present

## 2023-09-22 DIAGNOSIS — N3281 Overactive bladder: Secondary | ICD-10-CM | POA: Diagnosis not present

## 2023-10-01 DIAGNOSIS — I10 Essential (primary) hypertension: Secondary | ICD-10-CM | POA: Diagnosis not present

## 2023-10-01 DIAGNOSIS — G250 Essential tremor: Secondary | ICD-10-CM | POA: Diagnosis not present

## 2023-10-01 DIAGNOSIS — Z6825 Body mass index (BMI) 25.0-25.9, adult: Secondary | ICD-10-CM | POA: Diagnosis not present

## 2023-10-01 DIAGNOSIS — G2581 Restless legs syndrome: Secondary | ICD-10-CM | POA: Diagnosis not present

## 2023-10-01 DIAGNOSIS — J069 Acute upper respiratory infection, unspecified: Secondary | ICD-10-CM | POA: Diagnosis not present

## 2023-10-01 DIAGNOSIS — K219 Gastro-esophageal reflux disease without esophagitis: Secondary | ICD-10-CM | POA: Diagnosis not present

## 2023-10-07 ENCOUNTER — Ambulatory Visit: Payer: Medicare HMO | Attending: Cardiology | Admitting: Cardiology

## 2023-10-08 ENCOUNTER — Encounter: Payer: Self-pay | Admitting: Cardiology

## 2023-10-15 ENCOUNTER — Ambulatory Visit: Payer: Medicare HMO

## 2023-10-15 DIAGNOSIS — I442 Atrioventricular block, complete: Secondary | ICD-10-CM

## 2023-10-15 LAB — CUP PACEART REMOTE DEVICE CHECK
Battery Remaining Longevity: 79 mo
Battery Voltage: 3 V
Brady Statistic AP VP Percent: 0 %
Brady Statistic AP VS Percent: 0 %
Brady Statistic AS VP Percent: 99.79 %
Brady Statistic AS VS Percent: 0.21 %
Brady Statistic RA Percent Paced: 0 %
Brady Statistic RV Percent Paced: 99.79 %
Date Time Interrogation Session: 20241218214929
Implantable Lead Connection Status: 753985
Implantable Lead Connection Status: 753985
Implantable Lead Connection Status: 753985
Implantable Lead Implant Date: 20150916
Implantable Lead Implant Date: 20150916
Implantable Lead Implant Date: 20150916
Implantable Lead Location: 753859
Implantable Lead Location: 753860
Implantable Lead Location: 753860
Implantable Lead Model: 5076
Implantable Lead Model: 5076
Implantable Lead Model: 5076
Implantable Pulse Generator Implant Date: 20231220
Lead Channel Impedance Value: 209 Ohm
Lead Channel Impedance Value: 285 Ohm
Lead Channel Impedance Value: 399 Ohm
Lead Channel Impedance Value: 399 Ohm
Lead Channel Impedance Value: 475 Ohm
Lead Channel Impedance Value: 475 Ohm
Lead Channel Impedance Value: 513 Ohm
Lead Channel Impedance Value: 570 Ohm
Lead Channel Impedance Value: 665 Ohm
Lead Channel Pacing Threshold Amplitude: 1.5 V
Lead Channel Pacing Threshold Amplitude: 1.75 V
Lead Channel Pacing Threshold Pulse Width: 0.4 ms
Lead Channel Pacing Threshold Pulse Width: 0.4 ms
Lead Channel Sensing Intrinsic Amplitude: 6.625 mV
Lead Channel Sensing Intrinsic Amplitude: 6.625 mV
Lead Channel Setting Pacing Amplitude: 3 V
Lead Channel Setting Pacing Amplitude: 3.75 V
Lead Channel Setting Pacing Pulse Width: 0.4 ms
Lead Channel Setting Pacing Pulse Width: 0.4 ms
Lead Channel Setting Sensing Sensitivity: 4 mV
Zone Setting Status: 755011

## 2023-10-27 DIAGNOSIS — R6889 Other general symptoms and signs: Secondary | ICD-10-CM | POA: Diagnosis not present

## 2023-11-02 DIAGNOSIS — F419 Anxiety disorder, unspecified: Secondary | ICD-10-CM | POA: Diagnosis not present

## 2023-11-02 DIAGNOSIS — Z6824 Body mass index (BMI) 24.0-24.9, adult: Secondary | ICD-10-CM | POA: Diagnosis not present

## 2023-11-02 DIAGNOSIS — E039 Hypothyroidism, unspecified: Secondary | ICD-10-CM | POA: Diagnosis not present

## 2023-11-02 DIAGNOSIS — F330 Major depressive disorder, recurrent, mild: Secondary | ICD-10-CM | POA: Diagnosis not present

## 2023-11-02 DIAGNOSIS — J309 Allergic rhinitis, unspecified: Secondary | ICD-10-CM | POA: Diagnosis not present

## 2023-11-19 NOTE — Progress Notes (Signed)
Remote pacemaker transmission.   

## 2023-11-26 DIAGNOSIS — I639 Cerebral infarction, unspecified: Secondary | ICD-10-CM | POA: Diagnosis not present

## 2023-11-26 DIAGNOSIS — N3281 Overactive bladder: Secondary | ICD-10-CM | POA: Diagnosis not present

## 2023-12-09 DIAGNOSIS — E559 Vitamin D deficiency, unspecified: Secondary | ICD-10-CM | POA: Diagnosis not present

## 2023-12-09 DIAGNOSIS — E785 Hyperlipidemia, unspecified: Secondary | ICD-10-CM | POA: Diagnosis not present

## 2023-12-09 DIAGNOSIS — F330 Major depressive disorder, recurrent, mild: Secondary | ICD-10-CM | POA: Diagnosis not present

## 2023-12-09 DIAGNOSIS — J449 Chronic obstructive pulmonary disease, unspecified: Secondary | ICD-10-CM | POA: Diagnosis not present

## 2023-12-09 DIAGNOSIS — F419 Anxiety disorder, unspecified: Secondary | ICD-10-CM | POA: Diagnosis not present

## 2023-12-09 DIAGNOSIS — D509 Iron deficiency anemia, unspecified: Secondary | ICD-10-CM | POA: Diagnosis not present

## 2023-12-21 ENCOUNTER — Encounter: Payer: Medicare HMO | Admitting: Cardiology

## 2023-12-24 DIAGNOSIS — I639 Cerebral infarction, unspecified: Secondary | ICD-10-CM | POA: Diagnosis not present

## 2023-12-24 DIAGNOSIS — N3281 Overactive bladder: Secondary | ICD-10-CM | POA: Diagnosis not present

## 2024-01-11 DIAGNOSIS — I10 Essential (primary) hypertension: Secondary | ICD-10-CM | POA: Diagnosis not present

## 2024-01-11 DIAGNOSIS — J069 Acute upper respiratory infection, unspecified: Secondary | ICD-10-CM | POA: Diagnosis not present

## 2024-01-11 DIAGNOSIS — K219 Gastro-esophageal reflux disease without esophagitis: Secondary | ICD-10-CM | POA: Diagnosis not present

## 2024-01-11 DIAGNOSIS — G2581 Restless legs syndrome: Secondary | ICD-10-CM | POA: Diagnosis not present

## 2024-01-11 DIAGNOSIS — G250 Essential tremor: Secondary | ICD-10-CM | POA: Diagnosis not present

## 2024-01-12 DIAGNOSIS — J449 Chronic obstructive pulmonary disease, unspecified: Secondary | ICD-10-CM | POA: Diagnosis not present

## 2024-01-14 ENCOUNTER — Ambulatory Visit (INDEPENDENT_AMBULATORY_CARE_PROVIDER_SITE_OTHER): Payer: Medicare HMO

## 2024-01-14 DIAGNOSIS — I442 Atrioventricular block, complete: Secondary | ICD-10-CM

## 2024-01-14 LAB — CUP PACEART REMOTE DEVICE CHECK
Battery Remaining Longevity: 76 mo
Battery Voltage: 2.99 V
Brady Statistic AP VP Percent: 0 %
Brady Statistic AP VS Percent: 0 %
Brady Statistic AS VP Percent: 99.04 %
Brady Statistic AS VS Percent: 0.96 %
Brady Statistic RA Percent Paced: 0 %
Brady Statistic RV Percent Paced: 99.04 %
Date Time Interrogation Session: 20250319205744
Implantable Lead Connection Status: 753985
Implantable Lead Connection Status: 753985
Implantable Lead Connection Status: 753985
Implantable Lead Implant Date: 20150916
Implantable Lead Implant Date: 20150916
Implantable Lead Implant Date: 20150916
Implantable Lead Location: 753859
Implantable Lead Location: 753860
Implantable Lead Location: 753860
Implantable Lead Model: 5076
Implantable Lead Model: 5076
Implantable Lead Model: 5076
Implantable Pulse Generator Implant Date: 20231220
Lead Channel Impedance Value: 209 Ohm
Lead Channel Impedance Value: 285 Ohm
Lead Channel Impedance Value: 399 Ohm
Lead Channel Impedance Value: 399 Ohm
Lead Channel Impedance Value: 437 Ohm
Lead Channel Impedance Value: 475 Ohm
Lead Channel Impedance Value: 494 Ohm
Lead Channel Impedance Value: 608 Ohm
Lead Channel Impedance Value: 627 Ohm
Lead Channel Pacing Threshold Amplitude: 1.25 V
Lead Channel Pacing Threshold Amplitude: 1.75 V
Lead Channel Pacing Threshold Pulse Width: 0.4 ms
Lead Channel Pacing Threshold Pulse Width: 0.4 ms
Lead Channel Sensing Intrinsic Amplitude: 6.625 mV
Lead Channel Sensing Intrinsic Amplitude: 6.625 mV
Lead Channel Setting Pacing Amplitude: 3 V
Lead Channel Setting Pacing Amplitude: 3.75 V
Lead Channel Setting Pacing Pulse Width: 0.4 ms
Lead Channel Setting Pacing Pulse Width: 0.4 ms
Lead Channel Setting Sensing Sensitivity: 4 mV
Zone Setting Status: 755011

## 2024-01-22 DIAGNOSIS — J449 Chronic obstructive pulmonary disease, unspecified: Secondary | ICD-10-CM | POA: Diagnosis not present

## 2024-01-22 DIAGNOSIS — N3281 Overactive bladder: Secondary | ICD-10-CM | POA: Diagnosis not present

## 2024-01-22 DIAGNOSIS — I639 Cerebral infarction, unspecified: Secondary | ICD-10-CM | POA: Diagnosis not present

## 2024-02-01 DIAGNOSIS — S40021A Contusion of right upper arm, initial encounter: Secondary | ICD-10-CM | POA: Diagnosis not present

## 2024-02-01 DIAGNOSIS — I10 Essential (primary) hypertension: Secondary | ICD-10-CM | POA: Diagnosis not present

## 2024-02-10 DIAGNOSIS — Z8679 Personal history of other diseases of the circulatory system: Secondary | ICD-10-CM | POA: Diagnosis not present

## 2024-02-10 DIAGNOSIS — M810 Age-related osteoporosis without current pathological fracture: Secondary | ICD-10-CM | POA: Diagnosis not present

## 2024-02-10 DIAGNOSIS — E039 Hypothyroidism, unspecified: Secondary | ICD-10-CM | POA: Diagnosis not present

## 2024-02-10 DIAGNOSIS — J309 Allergic rhinitis, unspecified: Secondary | ICD-10-CM | POA: Diagnosis not present

## 2024-02-12 DIAGNOSIS — J449 Chronic obstructive pulmonary disease, unspecified: Secondary | ICD-10-CM | POA: Diagnosis not present

## 2024-02-14 NOTE — Progress Notes (Unsigned)
  Electrophysiology Office Note:   Date:  02/15/2024  ID:  Nichole Nelson, DOB [birth year], MRN [id number]  Primary Cardiologist: None Primary Heart Failure: None Electrophysiologist: Anwitha Mapes Cortland Ding, MD      History of Present Illness:   Nichole Nelson is a 89 y.o. female with h/o coronary artery disease post CABG, hypertension, hyperlipidemia, atrial fibrillation and AV node ablation, CVA, tremor seen today for routine electrophysiology followup.   Since last being seen in our clinic the patient reports doing well.  She has no chest pain or shortness of breath.  She is on oxygen 24 hours a day at this point.  She is in a wheelchair in clinic.  Device interrogation shows elevated fluid.  She does state that she is having some abdominal bloating.  she denies chest pain, palpitations, dyspnea, PND, orthopnea, nausea, vomiting, dizziness, syncope, edema, weight gain, or early satiety.   Review of systems complete and found to be negative unless listed in HPI.      EP Information / Studies Reviewed:    EKG is ordered today. Personal review as below.  Atrial fibrillation, ventricular paced  PPM Interrogation-  reviewed in detail today,  See PACEART report.  Device History: Medtronic BiV PPM implanted for Uncontrolled atrial arrhyhtmia s/p AV node ablation  Risk Assessment/Calculations:    CHA2DS2-VASc Score =     This indicates a  % annual risk of stroke. The patient's score is based upon:              Physical Exam:   VS:  BP 130/64   Pulse 70   Ht 5' (1.524 m)   SpO2 (!) 89%   BMI 24.41 kg/m    Wt Readings from Last 3 Encounters:  10/30/22 125 lb (56.7 kg)  10/15/22 120 lb (54.4 kg)  01/08/22 125 lb (56.7 kg)     GEN: Well nourished, well developed in no acute distress NECK: No JVD; No carotid bruits CARDIAC: Regular rate and rhythm, no murmurs, rubs, gallops RESPIRATORY:  Clear to auscultation without rales, wheezing or rhonchi  ABDOMEN: Soft,  non-tender, non-distended EXTREMITIES:  No edema; No deformity   ASSESSMENT AND PLAN:    Uncontrolled atrial arrhyhtmia s/p AV node ablation s/p Medtronic PPM  Normal PPM function See Pace Art report Sensing, threshold, impedance within normal limits Programming reviewed and appropriate for patient. No changes today Device shows fluid retention.  Nichole Nelson start Lasix  20 mg daily for the next 4 days.  2.  Permanent atrial fibrillation: Post AV node ablation.  3.  Secondary hypercoagulable date: Currently on Eliquis  for atrial fibrillation  4.  Coronary artery disease: No current angina.  Plan per primary cardiology  5.  Hypertension: Well-controlled  Disposition:   Follow up with Dr. Lawana Pray in 12 months  Signed, Kahlen Morais Cortland Ding, MD

## 2024-02-15 ENCOUNTER — Encounter: Payer: Self-pay | Admitting: Cardiology

## 2024-02-15 ENCOUNTER — Ambulatory Visit: Payer: Medicare HMO | Attending: Cardiology | Admitting: Cardiology

## 2024-02-15 VITALS — BP 130/64 | HR 70 | Ht 60.0 in

## 2024-02-15 DIAGNOSIS — R0602 Shortness of breath: Secondary | ICD-10-CM | POA: Diagnosis not present

## 2024-02-15 DIAGNOSIS — I2510 Atherosclerotic heart disease of native coronary artery without angina pectoris: Secondary | ICD-10-CM

## 2024-02-15 DIAGNOSIS — D6869 Other thrombophilia: Secondary | ICD-10-CM | POA: Diagnosis not present

## 2024-02-15 DIAGNOSIS — I4821 Permanent atrial fibrillation: Secondary | ICD-10-CM | POA: Diagnosis not present

## 2024-02-15 DIAGNOSIS — I10 Essential (primary) hypertension: Secondary | ICD-10-CM | POA: Diagnosis not present

## 2024-02-15 DIAGNOSIS — R600 Localized edema: Secondary | ICD-10-CM | POA: Diagnosis not present

## 2024-02-15 DIAGNOSIS — Z79899 Other long term (current) drug therapy: Secondary | ICD-10-CM | POA: Diagnosis not present

## 2024-02-15 LAB — CUP PACEART INCLINIC DEVICE CHECK
Date Time Interrogation Session: 20250421100812
Implantable Lead Connection Status: 753985
Implantable Lead Connection Status: 753985
Implantable Lead Connection Status: 753985
Implantable Lead Implant Date: 20150916
Implantable Lead Implant Date: 20150916
Implantable Lead Implant Date: 20150916
Implantable Lead Location: 753859
Implantable Lead Location: 753860
Implantable Lead Location: 753860
Implantable Lead Model: 5076
Implantable Lead Model: 5076
Implantable Lead Model: 5076
Implantable Pulse Generator Implant Date: 20231220

## 2024-02-15 MED ORDER — FUROSEMIDE 20 MG PO TABS: ORAL_TABLET | ORAL | 0 refills | Status: DC

## 2024-02-15 NOTE — Patient Instructions (Addendum)
 Medication Instructions:  Your physician has recommended you make the following change in your medication:  TAKE Furosemide  (Lasix ) 20 mg once daily for 4 days, then stop  *If you need a refill on your cardiac medications before your next appointment, please call your pharmacy*   Lab Work: Today: BMET  If you have labs (blood work) drawn today and your tests are completely normal, you will receive your results only by: MyChart Message (if you have MyChart) OR A paper copy in the mail If you have any lab test that is abnormal or we need to change your treatment, we will call you to review the results.   Testing/Procedures: None ordered   Follow-Up: At [facility], you and your health needs are our priority.  As part of our continuing mission to provide you with exceptional heart care, we have created designated Provider Care Teams.  These Care Teams include your primary Cardiologist (physician) and Advanced Practice Providers (APPs -  Physician Assistants and Nurse Practitioners) who all work together to provide you with the care you need, when you need it.  Remote monitoring is used to monitor your Pacemaker or ICD from home. This monitoring reduces the number of office visits required to check your device to one time per year. It allows us  to keep an eye on the functioning of your device to ensure it is working properly. You are scheduled for a device check from home on 04/14/2024. You may send your transmission at any time that day. If you have a wireless device, the transmission will be sent automatically. After your physician reviews your transmission, you will receive a postcard with your next transmission date.  Your physician recommends that you schedule a follow-up appointment in: 6 months with Dr. Krasowski.   Your next appointment:   1 year(s)  Provider:   Agatha Horsfall, MD    Thank you for choosing [facility]!!   Reece Cane, RN [phone]    Other  Instructions

## 2024-02-16 LAB — BASIC METABOLIC PANEL WITH GFR
BUN/Creatinine Ratio: 16 (ref 12–28)
BUN: 17 mg/dL (ref 8–27)
CO2: 24 mmol/L (ref 20–29)
Calcium: 9.3 mg/dL (ref 8.7–10.3)
Chloride: 106 mmol/L (ref 96–106)
Creatinine, Ser: 1.04 mg/dL — ABNORMAL HIGH (ref 0.57–1.00)
Glucose: 89 mg/dL (ref 70–99)
Potassium: 4.5 mmol/L (ref 3.5–5.2)
Sodium: 146 mmol/L — ABNORMAL HIGH (ref 134–144)
eGFR: 52 mL/min/{1.73_m2} — ABNORMAL LOW (ref >59)

## 2024-02-19 DIAGNOSIS — I639 Cerebral infarction, unspecified: Secondary | ICD-10-CM | POA: Diagnosis not present

## 2024-02-19 DIAGNOSIS — N3281 Overactive bladder: Secondary | ICD-10-CM | POA: Diagnosis not present

## 2024-02-25 NOTE — Progress Notes (Signed)
 Remote pacemaker transmission.

## 2024-03-10 ENCOUNTER — Other Ambulatory Visit: Payer: Self-pay | Admitting: Cardiology

## 2024-03-10 DIAGNOSIS — I4821 Permanent atrial fibrillation: Secondary | ICD-10-CM

## 2024-03-10 NOTE — Telephone Encounter (Signed)
 Prescription refill request for Eliquis  received. Indication: afib  Last office visit: [place] 02/15/2024 Scr: 1.04, 02/15/2024 Age: 89 yo  Weight: 56.7 kg   Refill sent.

## 2024-03-13 DIAGNOSIS — J449 Chronic obstructive pulmonary disease, unspecified: Secondary | ICD-10-CM | POA: Diagnosis not present

## 2024-03-16 DIAGNOSIS — D509 Iron deficiency anemia, unspecified: Secondary | ICD-10-CM | POA: Diagnosis not present

## 2024-03-16 DIAGNOSIS — E559 Vitamin D deficiency, unspecified: Secondary | ICD-10-CM | POA: Diagnosis not present

## 2024-03-16 DIAGNOSIS — E785 Hyperlipidemia, unspecified: Secondary | ICD-10-CM | POA: Diagnosis not present

## 2024-03-23 DIAGNOSIS — I639 Cerebral infarction, unspecified: Secondary | ICD-10-CM | POA: Diagnosis not present

## 2024-03-23 DIAGNOSIS — N3281 Overactive bladder: Secondary | ICD-10-CM | POA: Diagnosis not present

## 2024-04-13 DIAGNOSIS — J449 Chronic obstructive pulmonary disease, unspecified: Secondary | ICD-10-CM | POA: Diagnosis not present

## 2024-04-13 DIAGNOSIS — I10 Essential (primary) hypertension: Secondary | ICD-10-CM | POA: Diagnosis not present

## 2024-04-13 DIAGNOSIS — G2581 Restless legs syndrome: Secondary | ICD-10-CM | POA: Diagnosis not present

## 2024-04-13 DIAGNOSIS — K219 Gastro-esophageal reflux disease without esophagitis: Secondary | ICD-10-CM | POA: Diagnosis not present

## 2024-04-13 DIAGNOSIS — G250 Essential tremor: Secondary | ICD-10-CM | POA: Diagnosis not present

## 2024-04-14 ENCOUNTER — Ambulatory Visit (INDEPENDENT_AMBULATORY_CARE_PROVIDER_SITE_OTHER): Payer: Medicare HMO

## 2024-04-14 DIAGNOSIS — I442 Atrioventricular block, complete: Secondary | ICD-10-CM

## 2024-04-14 LAB — CUP PACEART REMOTE DEVICE CHECK
Battery Remaining Longevity: 70 mo
Battery Voltage: 2.98 V
Brady Statistic AP VP Percent: 0 %
Brady Statistic AP VS Percent: 0 %
Brady Statistic AS VP Percent: 99.82 %
Brady Statistic AS VS Percent: 0.18 %
Brady Statistic RA Percent Paced: 0 %
Brady Statistic RV Percent Paced: 99.82 %
Date Time Interrogation Session: 20250619080359
Implantable Lead Connection Status: 753985
Implantable Lead Connection Status: 753985
Implantable Lead Connection Status: 753985
Implantable Lead Implant Date: 20150916
Implantable Lead Implant Date: 20150916
Implantable Lead Implant Date: 20150916
Implantable Lead Location: 753859
Implantable Lead Location: 753860
Implantable Lead Location: 753860
Implantable Lead Model: 5076
Implantable Lead Model: 5076
Implantable Lead Model: 5076
Implantable Pulse Generator Implant Date: 20231220
Lead Channel Impedance Value: 209 Ohm
Lead Channel Impedance Value: 304 Ohm
Lead Channel Impedance Value: 399 Ohm
Lead Channel Impedance Value: 418 Ohm
Lead Channel Impedance Value: 418 Ohm
Lead Channel Impedance Value: 437 Ohm
Lead Channel Impedance Value: 475 Ohm
Lead Channel Impedance Value: 608 Ohm
Lead Channel Impedance Value: 684 Ohm
Lead Channel Pacing Threshold Amplitude: 1.5 V
Lead Channel Pacing Threshold Amplitude: 1.75 V
Lead Channel Pacing Threshold Pulse Width: 0.4 ms
Lead Channel Pacing Threshold Pulse Width: 0.4 ms
Lead Channel Sensing Intrinsic Amplitude: 6.625 mV
Lead Channel Sensing Intrinsic Amplitude: 6.625 mV
Lead Channel Setting Pacing Amplitude: 3.25 V
Lead Channel Setting Pacing Amplitude: 3.75 V
Lead Channel Setting Pacing Pulse Width: 0.4 ms
Lead Channel Setting Pacing Pulse Width: 0.4 ms
Lead Channel Setting Sensing Sensitivity: 4 mV
Zone Setting Status: 755011

## 2024-04-15 ENCOUNTER — Ambulatory Visit: Payer: Self-pay | Admitting: Cardiology

## 2024-04-22 DIAGNOSIS — I639 Cerebral infarction, unspecified: Secondary | ICD-10-CM | POA: Diagnosis not present

## 2024-04-22 DIAGNOSIS — N3281 Overactive bladder: Secondary | ICD-10-CM | POA: Diagnosis not present

## 2024-05-03 MED ORDER — METOPROLOL SUCCINATE ER 50 MG PO TB24: 50.0000 mg | ORAL_TABLET | Freq: Every day | ORAL | 6 refills | Status: DC

## 2024-05-03 MED ORDER — FUROSEMIDE 20 MG PO TABS: ORAL_TABLET | ORAL | 0 refills | Status: AC

## 2024-05-03 NOTE — Telephone Encounter (Signed)
 Spoke to son, advised of findings/recommendation.  Agreeable to plan.  Rx sent to [facility] per request.  Advised to call the office is SE occur after medication change.  Son verbalized understanding and agreeable to plan.

## 2024-05-03 NOTE — Telephone Encounter (Signed)
-----   Message from Will [facility] sent at 04/15/2024  9:24 AM EDT ----- Remote pacemaker interrogation. Presenting Rhythm:V-paced. Battery and lead parameters stable with stable capture and sensing. Device programming is appropriate. Continue remote monitoring.  Lasix  20 BID x4 dsay Stop diltiazem, start toprol  xl 50 mg daily for VT ----- Message ----- From: Interface, [employer] Sent: 04/14/2024   6:02 PM EDT To: Will Gladis Norton, MD

## 2024-05-06 ENCOUNTER — Telehealth: Payer: Self-pay | Admitting: Cardiology

## 2024-05-06 NOTE — Telephone Encounter (Signed)
 Spoke with pt's son (per DPR) regarding her medications. Sone was calling to ask how the pt should be taking her metoprolol  succinate. Son was told that the medication is prescribed to be taken once daily at bedtime. Son was advised to have the pt take losartan in the morning. Son verbalized understanding. All questions if any were answered.

## 2024-05-06 NOTE — Telephone Encounter (Signed)
 Pt c/o medication issue:  1. Name of Medication:   metoprolol  succinate (TOPROL -XL) 50 MG 24 hr tablet    2. How are you currently taking this medication (dosage and times per day)?   3. Are you having a reaction (difficulty breathing--STAT)?   4. What is your medication issue?   Patient's son would like to clarify how patient should be taking Metoprolol . Please advise

## 2024-05-13 DIAGNOSIS — J449 Chronic obstructive pulmonary disease, unspecified: Secondary | ICD-10-CM | POA: Diagnosis not present

## 2024-05-17 DIAGNOSIS — Z8679 Personal history of other diseases of the circulatory system: Secondary | ICD-10-CM | POA: Diagnosis not present

## 2024-05-17 DIAGNOSIS — Z760 Encounter for issue of repeat prescription: Secondary | ICD-10-CM | POA: Diagnosis not present

## 2024-05-17 DIAGNOSIS — E039 Hypothyroidism, unspecified: Secondary | ICD-10-CM | POA: Diagnosis not present

## 2024-05-17 DIAGNOSIS — G2581 Restless legs syndrome: Secondary | ICD-10-CM | POA: Diagnosis not present

## 2024-05-17 DIAGNOSIS — G250 Essential tremor: Secondary | ICD-10-CM | POA: Diagnosis not present

## 2024-05-17 DIAGNOSIS — E559 Vitamin D deficiency, unspecified: Secondary | ICD-10-CM | POA: Diagnosis not present

## 2024-05-17 DIAGNOSIS — R739 Hyperglycemia, unspecified: Secondary | ICD-10-CM | POA: Diagnosis not present

## 2024-05-17 DIAGNOSIS — J309 Allergic rhinitis, unspecified: Secondary | ICD-10-CM | POA: Diagnosis not present

## 2024-05-17 DIAGNOSIS — J449 Chronic obstructive pulmonary disease, unspecified: Secondary | ICD-10-CM | POA: Diagnosis not present

## 2024-05-17 DIAGNOSIS — Z79899 Other long term (current) drug therapy: Secondary | ICD-10-CM | POA: Diagnosis not present

## 2024-05-17 DIAGNOSIS — K219 Gastro-esophageal reflux disease without esophagitis: Secondary | ICD-10-CM | POA: Diagnosis not present

## 2024-05-23 DIAGNOSIS — N3281 Overactive bladder: Secondary | ICD-10-CM | POA: Diagnosis not present

## 2024-05-23 DIAGNOSIS — I639 Cerebral infarction, unspecified: Secondary | ICD-10-CM | POA: Diagnosis not present

## 2024-06-02 DIAGNOSIS — R110 Nausea: Secondary | ICD-10-CM | POA: Diagnosis not present

## 2024-06-02 DIAGNOSIS — Z7951 Long term (current) use of inhaled steroids: Secondary | ICD-10-CM | POA: Diagnosis not present

## 2024-06-02 DIAGNOSIS — I083 Combined rheumatic disorders of mitral, aortic and tricuspid valves: Secondary | ICD-10-CM | POA: Diagnosis not present

## 2024-06-02 DIAGNOSIS — R9082 White matter disease, unspecified: Secondary | ICD-10-CM | POA: Diagnosis not present

## 2024-06-02 DIAGNOSIS — I482 Chronic atrial fibrillation, unspecified: Secondary | ICD-10-CM | POA: Diagnosis not present

## 2024-06-02 DIAGNOSIS — E785 Hyperlipidemia, unspecified: Secondary | ICD-10-CM | POA: Diagnosis not present

## 2024-06-02 DIAGNOSIS — Z79899 Other long term (current) drug therapy: Secondary | ICD-10-CM | POA: Diagnosis not present

## 2024-06-02 DIAGNOSIS — Z951 Presence of aortocoronary bypass graft: Secondary | ICD-10-CM | POA: Diagnosis not present

## 2024-06-02 DIAGNOSIS — Z95818 Presence of other cardiac implants and grafts: Secondary | ICD-10-CM | POA: Diagnosis not present

## 2024-06-02 DIAGNOSIS — R531 Weakness: Secondary | ICD-10-CM | POA: Diagnosis not present

## 2024-06-02 DIAGNOSIS — I69354 Hemiplegia and hemiparesis following cerebral infarction affecting left non-dominant side: Secondary | ICD-10-CM | POA: Diagnosis not present

## 2024-06-02 DIAGNOSIS — G459 Transient cerebral ischemic attack, unspecified: Secondary | ICD-10-CM | POA: Diagnosis not present

## 2024-06-02 DIAGNOSIS — R29818 Other symptoms and signs involving the nervous system: Secondary | ICD-10-CM | POA: Diagnosis not present

## 2024-06-02 DIAGNOSIS — Z7901 Long term (current) use of anticoagulants: Secondary | ICD-10-CM | POA: Diagnosis not present

## 2024-06-02 DIAGNOSIS — I6782 Cerebral ischemia: Secondary | ICD-10-CM | POA: Diagnosis not present

## 2024-06-02 DIAGNOSIS — E039 Hypothyroidism, unspecified: Secondary | ICD-10-CM | POA: Diagnosis not present

## 2024-06-02 DIAGNOSIS — I6523 Occlusion and stenosis of bilateral carotid arteries: Secondary | ICD-10-CM | POA: Diagnosis not present

## 2024-06-02 DIAGNOSIS — I5020 Unspecified systolic (congestive) heart failure: Secondary | ICD-10-CM | POA: Diagnosis not present

## 2024-06-02 DIAGNOSIS — I63511 Cerebral infarction due to unspecified occlusion or stenosis of right middle cerebral artery: Secondary | ICD-10-CM | POA: Diagnosis not present

## 2024-06-02 DIAGNOSIS — I272 Pulmonary hypertension, unspecified: Secondary | ICD-10-CM | POA: Diagnosis not present

## 2024-06-02 DIAGNOSIS — I639 Cerebral infarction, unspecified: Secondary | ICD-10-CM | POA: Diagnosis not present

## 2024-06-02 DIAGNOSIS — Z7401 Bed confinement status: Secondary | ICD-10-CM | POA: Diagnosis not present

## 2024-06-02 DIAGNOSIS — I2510 Atherosclerotic heart disease of native coronary artery without angina pectoris: Secondary | ICD-10-CM | POA: Diagnosis not present

## 2024-06-02 DIAGNOSIS — R918 Other nonspecific abnormal finding of lung field: Secondary | ICD-10-CM | POA: Diagnosis not present

## 2024-06-02 DIAGNOSIS — D649 Anemia, unspecified: Secondary | ICD-10-CM | POA: Diagnosis not present

## 2024-06-02 DIAGNOSIS — K449 Diaphragmatic hernia without obstruction or gangrene: Secondary | ICD-10-CM | POA: Diagnosis not present

## 2024-06-02 DIAGNOSIS — Z743 Need for continuous supervision: Secondary | ICD-10-CM | POA: Diagnosis not present

## 2024-06-02 DIAGNOSIS — G43109 Migraine with aura, not intractable, without status migrainosus: Secondary | ICD-10-CM | POA: Diagnosis not present

## 2024-06-02 DIAGNOSIS — J9611 Chronic respiratory failure with hypoxia: Secondary | ICD-10-CM | POA: Diagnosis not present

## 2024-06-02 DIAGNOSIS — Z950 Presence of cardiac pacemaker: Secondary | ICD-10-CM | POA: Diagnosis not present

## 2024-06-02 DIAGNOSIS — J449 Chronic obstructive pulmonary disease, unspecified: Secondary | ICD-10-CM | POA: Diagnosis not present

## 2024-06-02 DIAGNOSIS — I10 Essential (primary) hypertension: Secondary | ICD-10-CM | POA: Diagnosis not present

## 2024-06-02 DIAGNOSIS — J90 Pleural effusion, not elsewhere classified: Secondary | ICD-10-CM | POA: Diagnosis not present

## 2024-06-13 DIAGNOSIS — J449 Chronic obstructive pulmonary disease, unspecified: Secondary | ICD-10-CM | POA: Diagnosis not present

## 2024-06-13 DIAGNOSIS — Z09 Encounter for follow-up examination after completed treatment for conditions other than malignant neoplasm: Secondary | ICD-10-CM | POA: Diagnosis not present

## 2024-06-13 DIAGNOSIS — M545 Low back pain, unspecified: Secondary | ICD-10-CM | POA: Diagnosis not present

## 2024-06-15 DIAGNOSIS — R9389 Abnormal findings on diagnostic imaging of other specified body structures: Secondary | ICD-10-CM | POA: Diagnosis not present

## 2024-06-15 DIAGNOSIS — I6990 Unspecified sequelae of unspecified cerebrovascular disease: Secondary | ICD-10-CM | POA: Diagnosis not present

## 2024-06-15 DIAGNOSIS — J309 Allergic rhinitis, unspecified: Secondary | ICD-10-CM | POA: Diagnosis not present

## 2024-06-15 DIAGNOSIS — J449 Chronic obstructive pulmonary disease, unspecified: Secondary | ICD-10-CM | POA: Diagnosis not present

## 2024-06-20 DIAGNOSIS — I6523 Occlusion and stenosis of bilateral carotid arteries: Secondary | ICD-10-CM | POA: Diagnosis not present

## 2024-06-20 DIAGNOSIS — R918 Other nonspecific abnormal finding of lung field: Secondary | ICD-10-CM | POA: Diagnosis not present

## 2024-06-20 DIAGNOSIS — R29810 Facial weakness: Secondary | ICD-10-CM | POA: Diagnosis not present

## 2024-06-20 DIAGNOSIS — Z885 Allergy status to narcotic agent status: Secondary | ICD-10-CM | POA: Diagnosis not present

## 2024-06-20 DIAGNOSIS — I482 Chronic atrial fibrillation, unspecified: Secondary | ICD-10-CM | POA: Diagnosis not present

## 2024-06-20 DIAGNOSIS — I6389 Other cerebral infarction: Secondary | ICD-10-CM | POA: Diagnosis not present

## 2024-06-20 DIAGNOSIS — N3281 Overactive bladder: Secondary | ICD-10-CM | POA: Diagnosis not present

## 2024-06-20 DIAGNOSIS — Z888 Allergy status to other drugs, medicaments and biological substances status: Secondary | ICD-10-CM | POA: Diagnosis not present

## 2024-06-20 DIAGNOSIS — R9089 Other abnormal findings on diagnostic imaging of central nervous system: Secondary | ICD-10-CM | POA: Diagnosis not present

## 2024-06-20 DIAGNOSIS — I639 Cerebral infarction, unspecified: Secondary | ICD-10-CM | POA: Diagnosis not present

## 2024-06-20 DIAGNOSIS — R29818 Other symptoms and signs involving the nervous system: Secondary | ICD-10-CM | POA: Diagnosis not present

## 2024-06-20 DIAGNOSIS — I4589 Other specified conduction disorders: Secondary | ICD-10-CM | POA: Diagnosis not present

## 2024-06-20 DIAGNOSIS — Z7901 Long term (current) use of anticoagulants: Secondary | ICD-10-CM | POA: Diagnosis not present

## 2024-06-20 DIAGNOSIS — I10 Essential (primary) hypertension: Secondary | ICD-10-CM | POA: Diagnosis not present

## 2024-06-20 DIAGNOSIS — G459 Transient cerebral ischemic attack, unspecified: Secondary | ICD-10-CM | POA: Diagnosis not present

## 2024-06-20 DIAGNOSIS — I517 Cardiomegaly: Secondary | ICD-10-CM | POA: Diagnosis not present

## 2024-06-20 DIAGNOSIS — Z743 Need for continuous supervision: Secondary | ICD-10-CM | POA: Diagnosis not present

## 2024-06-20 DIAGNOSIS — I498 Other specified cardiac arrhythmias: Secondary | ICD-10-CM | POA: Diagnosis not present

## 2024-06-20 DIAGNOSIS — R29700 NIHSS score 0: Secondary | ICD-10-CM | POA: Diagnosis not present

## 2024-06-20 DIAGNOSIS — Z7951 Long term (current) use of inhaled steroids: Secondary | ICD-10-CM | POA: Diagnosis not present

## 2024-06-20 DIAGNOSIS — J449 Chronic obstructive pulmonary disease, unspecified: Secondary | ICD-10-CM | POA: Diagnosis not present

## 2024-06-20 DIAGNOSIS — R9082 White matter disease, unspecified: Secondary | ICD-10-CM | POA: Diagnosis not present

## 2024-06-20 DIAGNOSIS — Z91041 Radiographic dye allergy status: Secondary | ICD-10-CM | POA: Diagnosis not present

## 2024-06-20 DIAGNOSIS — R1310 Dysphagia, unspecified: Secondary | ICD-10-CM | POA: Diagnosis not present

## 2024-06-20 DIAGNOSIS — R4781 Slurred speech: Secondary | ICD-10-CM | POA: Diagnosis not present

## 2024-06-20 DIAGNOSIS — R531 Weakness: Secondary | ICD-10-CM | POA: Diagnosis not present

## 2024-06-20 DIAGNOSIS — E039 Hypothyroidism, unspecified: Secondary | ICD-10-CM | POA: Diagnosis not present

## 2024-06-20 DIAGNOSIS — G629 Polyneuropathy, unspecified: Secondary | ICD-10-CM | POA: Diagnosis not present

## 2024-06-20 DIAGNOSIS — Z79899 Other long term (current) drug therapy: Secondary | ICD-10-CM | POA: Diagnosis not present

## 2024-06-20 DIAGNOSIS — Z951 Presence of aortocoronary bypass graft: Secondary | ICD-10-CM | POA: Diagnosis not present

## 2024-06-20 DIAGNOSIS — R2990 Unspecified symptoms and signs involving the nervous system: Secondary | ICD-10-CM | POA: Diagnosis not present

## 2024-06-20 DIAGNOSIS — I2510 Atherosclerotic heart disease of native coronary artery without angina pectoris: Secondary | ICD-10-CM | POA: Diagnosis not present

## 2024-06-20 DIAGNOSIS — G9389 Other specified disorders of brain: Secondary | ICD-10-CM | POA: Diagnosis not present

## 2024-06-20 DIAGNOSIS — E785 Hyperlipidemia, unspecified: Secondary | ICD-10-CM | POA: Diagnosis not present

## 2024-06-20 DIAGNOSIS — K449 Diaphragmatic hernia without obstruction or gangrene: Secondary | ICD-10-CM | POA: Diagnosis not present

## 2024-06-21 DIAGNOSIS — I639 Cerebral infarction, unspecified: Secondary | ICD-10-CM | POA: Diagnosis not present

## 2024-06-21 NOTE — Progress Notes (Signed)
 Remote pacemaker transmission.

## 2024-06-22 DIAGNOSIS — I6782 Cerebral ischemia: Secondary | ICD-10-CM | POA: Diagnosis not present

## 2024-06-22 DIAGNOSIS — R531 Weakness: Secondary | ICD-10-CM | POA: Diagnosis not present

## 2024-06-22 DIAGNOSIS — R200 Anesthesia of skin: Secondary | ICD-10-CM | POA: Diagnosis not present

## 2024-06-22 DIAGNOSIS — I6523 Occlusion and stenosis of bilateral carotid arteries: Secondary | ICD-10-CM | POA: Diagnosis not present

## 2024-06-22 DIAGNOSIS — Z743 Need for continuous supervision: Secondary | ICD-10-CM | POA: Diagnosis not present

## 2024-06-22 DIAGNOSIS — G459 Transient cerebral ischemic attack, unspecified: Secondary | ICD-10-CM | POA: Diagnosis not present

## 2024-06-22 DIAGNOSIS — Z8673 Personal history of transient ischemic attack (TIA), and cerebral infarction without residual deficits: Secondary | ICD-10-CM | POA: Diagnosis not present

## 2024-06-22 DIAGNOSIS — G9389 Other specified disorders of brain: Secondary | ICD-10-CM | POA: Diagnosis not present

## 2024-06-22 DIAGNOSIS — I639 Cerebral infarction, unspecified: Secondary | ICD-10-CM | POA: Diagnosis not present

## 2024-06-22 DIAGNOSIS — R202 Paresthesia of skin: Secondary | ICD-10-CM | POA: Diagnosis not present

## 2024-06-22 DIAGNOSIS — R2681 Unsteadiness on feet: Secondary | ICD-10-CM | POA: Diagnosis not present

## 2024-06-23 DIAGNOSIS — I639 Cerebral infarction, unspecified: Secondary | ICD-10-CM | POA: Diagnosis not present

## 2024-06-23 DIAGNOSIS — Z7401 Bed confinement status: Secondary | ICD-10-CM | POA: Diagnosis not present

## 2024-06-23 DIAGNOSIS — G9389 Other specified disorders of brain: Secondary | ICD-10-CM | POA: Diagnosis not present

## 2024-07-13 DIAGNOSIS — I10 Essential (primary) hypertension: Secondary | ICD-10-CM | POA: Diagnosis not present

## 2024-07-13 DIAGNOSIS — G250 Essential tremor: Secondary | ICD-10-CM | POA: Diagnosis not present

## 2024-07-13 DIAGNOSIS — K219 Gastro-esophageal reflux disease without esophagitis: Secondary | ICD-10-CM | POA: Diagnosis not present

## 2024-07-13 DIAGNOSIS — G2581 Restless legs syndrome: Secondary | ICD-10-CM | POA: Diagnosis not present

## 2024-07-14 ENCOUNTER — Ambulatory Visit (INDEPENDENT_AMBULATORY_CARE_PROVIDER_SITE_OTHER): Payer: Medicare HMO

## 2024-07-14 DIAGNOSIS — J449 Chronic obstructive pulmonary disease, unspecified: Secondary | ICD-10-CM | POA: Diagnosis not present

## 2024-07-14 DIAGNOSIS — I442 Atrioventricular block, complete: Secondary | ICD-10-CM | POA: Diagnosis not present

## 2024-07-14 LAB — CUP PACEART REMOTE DEVICE CHECK
Battery Remaining Longevity: 97 mo
Battery Voltage: 2.99 V
Brady Statistic AP VP Percent: 0 %
Brady Statistic AP VS Percent: 0 %
Brady Statistic AS VP Percent: 99.89 %
Brady Statistic AS VS Percent: 0.11 %
Brady Statistic RA Percent Paced: 0 %
Brady Statistic RV Percent Paced: 99.89 %
Date Time Interrogation Session: 20250918020938
Implantable Lead Connection Status: 753985
Implantable Lead Connection Status: 753985
Implantable Lead Connection Status: 753985
Implantable Lead Implant Date: 20150916
Implantable Lead Implant Date: 20150916
Implantable Lead Implant Date: 20150916
Implantable Lead Location: 753859
Implantable Lead Location: 753860
Implantable Lead Location: 753860
Implantable Lead Model: 5076
Implantable Lead Model: 5076
Implantable Lead Model: 5076
Implantable Pulse Generator Implant Date: 20231220
Lead Channel Impedance Value: 209 Ohm
Lead Channel Impedance Value: 304 Ohm
Lead Channel Impedance Value: 399 Ohm
Lead Channel Impedance Value: 399 Ohm
Lead Channel Impedance Value: 418 Ohm
Lead Channel Impedance Value: 475 Ohm
Lead Channel Impedance Value: 532 Ohm
Lead Channel Impedance Value: 589 Ohm
Lead Channel Impedance Value: 665 Ohm
Lead Channel Pacing Threshold Amplitude: 0.875 V
Lead Channel Pacing Threshold Amplitude: 1.375 V
Lead Channel Pacing Threshold Pulse Width: 0.4 ms
Lead Channel Pacing Threshold Pulse Width: 0.4 ms
Lead Channel Sensing Intrinsic Amplitude: 6.625 mV
Lead Channel Sensing Intrinsic Amplitude: 6.625 mV
Lead Channel Setting Pacing Amplitude: 2 V
Lead Channel Setting Pacing Amplitude: 3 V
Lead Channel Setting Pacing Pulse Width: 0.4 ms
Lead Channel Setting Pacing Pulse Width: 0.4 ms
Lead Channel Setting Sensing Sensitivity: 4 mV
Zone Setting Status: 755011

## 2024-07-18 ENCOUNTER — Ambulatory Visit: Payer: Self-pay | Admitting: Cardiology

## 2024-07-19 NOTE — Progress Notes (Signed)
 Remote PPM Transmission

## 2024-07-23 DIAGNOSIS — Z8673 Personal history of transient ischemic attack (TIA), and cerebral infarction without residual deficits: Secondary | ICD-10-CM | POA: Diagnosis not present

## 2024-07-23 DIAGNOSIS — I639 Cerebral infarction, unspecified: Secondary | ICD-10-CM | POA: Diagnosis not present

## 2024-07-23 DIAGNOSIS — R2681 Unsteadiness on feet: Secondary | ICD-10-CM | POA: Diagnosis not present

## 2024-07-23 DIAGNOSIS — I6523 Occlusion and stenosis of bilateral carotid arteries: Secondary | ICD-10-CM | POA: Diagnosis not present

## 2024-07-25 DIAGNOSIS — I639 Cerebral infarction, unspecified: Secondary | ICD-10-CM | POA: Diagnosis not present

## 2024-07-27 DIAGNOSIS — J449 Chronic obstructive pulmonary disease, unspecified: Secondary | ICD-10-CM | POA: Diagnosis not present

## 2024-07-27 DIAGNOSIS — R9389 Abnormal findings on diagnostic imaging of other specified body structures: Secondary | ICD-10-CM | POA: Diagnosis not present

## 2024-08-13 DIAGNOSIS — J449 Chronic obstructive pulmonary disease, unspecified: Secondary | ICD-10-CM | POA: Diagnosis not present

## 2024-08-22 DIAGNOSIS — R2681 Unsteadiness on feet: Secondary | ICD-10-CM | POA: Diagnosis not present

## 2024-08-22 DIAGNOSIS — Z8673 Personal history of transient ischemic attack (TIA), and cerebral infarction without residual deficits: Secondary | ICD-10-CM | POA: Diagnosis not present

## 2024-08-22 DIAGNOSIS — I6523 Occlusion and stenosis of bilateral carotid arteries: Secondary | ICD-10-CM | POA: Diagnosis not present

## 2024-08-22 DIAGNOSIS — I639 Cerebral infarction, unspecified: Secondary | ICD-10-CM | POA: Diagnosis not present

## 2024-08-24 DIAGNOSIS — G250 Essential tremor: Secondary | ICD-10-CM | POA: Diagnosis not present

## 2024-08-24 DIAGNOSIS — Z8679 Personal history of other diseases of the circulatory system: Secondary | ICD-10-CM | POA: Diagnosis not present

## 2024-08-24 DIAGNOSIS — J309 Allergic rhinitis, unspecified: Secondary | ICD-10-CM | POA: Diagnosis not present

## 2024-08-24 DIAGNOSIS — M810 Age-related osteoporosis without current pathological fracture: Secondary | ICD-10-CM | POA: Diagnosis not present

## 2024-08-24 DIAGNOSIS — G2581 Restless legs syndrome: Secondary | ICD-10-CM | POA: Diagnosis not present

## 2024-09-14 ENCOUNTER — Other Ambulatory Visit: Payer: Self-pay

## 2024-09-14 DIAGNOSIS — I4821 Permanent atrial fibrillation: Secondary | ICD-10-CM

## 2024-09-14 MED ORDER — APIXABAN 2.5 MG PO TABS
2.5000 mg | ORAL_TABLET | Freq: Two times a day (BID) | ORAL | 5 refills | Status: AC
Start: 2024-09-14 — End: ?

## 2024-09-14 NOTE — Telephone Encounter (Signed)
 Prescription refill request for Eliquis  received. Indication:afib Last office visit:4/25 Scr:1.15  8/25 Age: 89 Weight:56.7  kg  Prescription refilled

## 2024-10-13 ENCOUNTER — Ambulatory Visit: Payer: Medicare HMO

## 2024-10-13 DIAGNOSIS — I442 Atrioventricular block, complete: Secondary | ICD-10-CM | POA: Diagnosis not present

## 2024-10-14 LAB — CUP PACEART REMOTE DEVICE CHECK
Battery Remaining Longevity: 90 mo
Battery Voltage: 2.98 V
Brady Statistic AP VP Percent: 0 %
Brady Statistic AP VS Percent: 0 %
Brady Statistic AS VP Percent: 99.85 %
Brady Statistic AS VS Percent: 0.15 %
Brady Statistic RA Percent Paced: 0 %
Brady Statistic RV Percent Paced: 99.85 %
Date Time Interrogation Session: 20251217192026
Implantable Lead Connection Status: 753985
Implantable Lead Connection Status: 753985
Implantable Lead Connection Status: 753985
Implantable Lead Implant Date: 20150916
Implantable Lead Implant Date: 20150916
Implantable Lead Implant Date: 20150916
Implantable Lead Location: 753859
Implantable Lead Location: 753860
Implantable Lead Location: 753860
Implantable Lead Model: 5076
Implantable Lead Model: 5076
Implantable Lead Model: 5076
Implantable Pulse Generator Implant Date: 20231220
Lead Channel Impedance Value: 209 Ohm
Lead Channel Impedance Value: 304 Ohm
Lead Channel Impedance Value: 399 Ohm
Lead Channel Impedance Value: 399 Ohm
Lead Channel Impedance Value: 418 Ohm
Lead Channel Impedance Value: 475 Ohm
Lead Channel Impedance Value: 513 Ohm
Lead Channel Impedance Value: 570 Ohm
Lead Channel Impedance Value: 665 Ohm
Lead Channel Pacing Threshold Amplitude: 1 V
Lead Channel Pacing Threshold Amplitude: 1.625 V
Lead Channel Pacing Threshold Pulse Width: 0.4 ms
Lead Channel Pacing Threshold Pulse Width: 0.4 ms
Lead Channel Sensing Intrinsic Amplitude: 6.625 mV
Lead Channel Sensing Intrinsic Amplitude: 6.625 mV
Lead Channel Setting Pacing Amplitude: 2 V
Lead Channel Setting Pacing Amplitude: 3.25 V
Lead Channel Setting Pacing Pulse Width: 0.4 ms
Lead Channel Setting Pacing Pulse Width: 0.4 ms
Lead Channel Setting Sensing Sensitivity: 4 mV
Zone Setting Status: 755011

## 2024-10-16 NOTE — Progress Notes (Signed)
 Remote PPM Transmission

## 2024-10-21 ENCOUNTER — Ambulatory Visit: Payer: Self-pay | Admitting: Cardiology

## 2024-11-22 ENCOUNTER — Other Ambulatory Visit: Payer: Self-pay

## 2024-11-25 MED ORDER — METOPROLOL SUCCINATE ER 50 MG PO TB24: 50.0000 mg | ORAL_TABLET | Freq: Every day | ORAL | 6 refills | Status: AC

## 2025-01-12 ENCOUNTER — Ambulatory Visit

## 2025-04-13 ENCOUNTER — Ambulatory Visit

## 2025-07-13 ENCOUNTER — Ambulatory Visit

## 2025-10-12 ENCOUNTER — Ambulatory Visit
